# Patient Record
Sex: Male | Born: 1960
Health system: Southern US, Community
[De-identification: ages and names within clinical notes are randomized; demographics above are authoritative.]

## PROBLEM LIST (undated history)

## (undated) DIAGNOSIS — R011 Cardiac murmur, unspecified: Secondary | ICD-10-CM

## (undated) DIAGNOSIS — J432 Centrilobular emphysema: Secondary | ICD-10-CM

## (undated) DIAGNOSIS — C801 Malignant (primary) neoplasm, unspecified: Secondary | ICD-10-CM

## (undated) DIAGNOSIS — D649 Anemia, unspecified: Secondary | ICD-10-CM

## (undated) DIAGNOSIS — F419 Anxiety disorder, unspecified: Secondary | ICD-10-CM

## (undated) DIAGNOSIS — E119 Type 2 diabetes mellitus without complications: Secondary | ICD-10-CM

## (undated) DIAGNOSIS — I10 Essential (primary) hypertension: Secondary | ICD-10-CM

## (undated) DIAGNOSIS — E079 Disorder of thyroid, unspecified: Secondary | ICD-10-CM

## (undated) HISTORY — DX: Essential (primary) hypertension: I10

## (undated) HISTORY — DX: Cardiac murmur, unspecified: R01.1

## (undated) HISTORY — PX: BONE MARROW BIOPSY: SHX199

## (undated) HISTORY — DX: Anemia, unspecified: D64.9

## (undated) HISTORY — PX: BRONCHOSCOPY: SUR163

## (undated) HISTORY — DX: Disorder of thyroid, unspecified: E07.9

## (undated) HISTORY — DX: Anxiety disorder, unspecified: F41.9

## (undated) HISTORY — DX: Malignant (primary) neoplasm, unspecified: C80.1

## (undated) HISTORY — DX: Centrilobular emphysema: J43.2

## (undated) HISTORY — DX: Type 2 diabetes mellitus without complications: E11.9

---

## 2003-05-24 ENCOUNTER — Other Ambulatory Visit: Admission: RE | Admit: 2003-05-24 | Discharge: 2003-05-24 | Payer: Self-pay | Admitting: Oncology

## 2003-05-24 ENCOUNTER — Encounter (INDEPENDENT_AMBULATORY_CARE_PROVIDER_SITE_OTHER): Payer: Self-pay | Admitting: Specialist

## 2004-09-05 ENCOUNTER — Ambulatory Visit: Payer: Self-pay | Admitting: Oncology

## 2004-10-24 ENCOUNTER — Ambulatory Visit: Payer: Self-pay | Admitting: Oncology

## 2004-12-12 ENCOUNTER — Ambulatory Visit: Payer: Self-pay | Admitting: Oncology

## 2005-01-29 ENCOUNTER — Ambulatory Visit: Payer: Self-pay | Admitting: Oncology

## 2005-03-17 ENCOUNTER — Ambulatory Visit: Payer: Self-pay | Admitting: Oncology

## 2005-05-22 ENCOUNTER — Ambulatory Visit: Payer: Self-pay | Admitting: Oncology

## 2005-07-24 ENCOUNTER — Ambulatory Visit: Payer: Self-pay | Admitting: Oncology

## 2005-10-28 ENCOUNTER — Ambulatory Visit: Payer: Self-pay | Admitting: Oncology

## 2006-01-01 ENCOUNTER — Ambulatory Visit: Payer: Self-pay | Admitting: Oncology

## 2006-02-19 ENCOUNTER — Ambulatory Visit: Payer: Self-pay | Admitting: Oncology

## 2006-02-20 LAB — CBC WITH DIFFERENTIAL/PLATELET
BASO%: 0.3 % (ref 0.0–2.0)
LYMPH%: 18.5 % (ref 14.0–48.0)
MCHC: 33.9 g/dL (ref 32.0–35.9)
MONO#: 0.5 10*3/uL (ref 0.1–0.9)
RBC: 5.28 10*6/uL (ref 4.20–5.71)
WBC: 8.8 10*3/uL (ref 4.0–10.0)
lymph#: 1.6 10*3/uL (ref 0.9–3.3)

## 2006-04-06 ENCOUNTER — Ambulatory Visit: Payer: Self-pay | Admitting: Oncology

## 2006-04-08 LAB — COMPREHENSIVE METABOLIC PANEL
Albumin: 4.3 g/dL (ref 3.5–5.2)
BUN: 13 mg/dL (ref 6–23)
Calcium: 9.2 mg/dL (ref 8.4–10.5)
Chloride: 100 mEq/L (ref 96–112)
Glucose, Bld: 158 mg/dL — ABNORMAL HIGH (ref 70–99)
Potassium: 3.8 mEq/L (ref 3.5–5.3)

## 2006-04-08 LAB — CBC & DIFF AND RETIC
BASO%: 0.4 % (ref 0.0–2.0)
EOS%: 2.4 % (ref 0.0–7.0)
Eosinophils Absolute: 0.2 10*3/uL (ref 0.0–0.5)
LYMPH%: 20.9 % (ref 14.0–48.0)
MCHC: 33.8 g/dL (ref 32.0–35.9)
MCV: 82.5 fL (ref 81.6–98.0)
MONO%: 5.2 % (ref 0.0–13.0)
NEUT#: 5.4 10*3/uL (ref 1.5–6.5)
Platelets: 467 10*3/uL — ABNORMAL HIGH (ref 145–400)
RBC: 4.91 10*6/uL (ref 4.20–5.71)
RDW: 17.3 % — ABNORMAL HIGH (ref 11.2–14.6)
RETIC #: 61.4 10*3/uL (ref 31.8–103.9)
Retic %: 1.3 % (ref 0.7–2.3)
WBC: 7.5 10*3/uL (ref 4.0–10.0)

## 2006-04-08 LAB — URIC ACID: Uric Acid, Serum: 8.1 mg/dL — ABNORMAL HIGH (ref 2.4–7.0)

## 2006-04-08 LAB — CHCC SMEAR

## 2006-06-03 ENCOUNTER — Ambulatory Visit: Payer: Self-pay | Admitting: Oncology

## 2006-08-05 ENCOUNTER — Ambulatory Visit: Payer: Self-pay | Admitting: Oncology

## 2006-09-28 ENCOUNTER — Ambulatory Visit: Payer: Self-pay | Admitting: Oncology

## 2006-10-02 LAB — CBC WITH DIFFERENTIAL/PLATELET
Basophils Absolute: 0 10*3/uL (ref 0.0–0.1)
Eosinophils Absolute: 0.1 10*3/uL (ref 0.0–0.5)
HCT: 37.7 % — ABNORMAL LOW (ref 38.7–49.9)
HGB: 12.9 g/dL — ABNORMAL LOW (ref 13.0–17.1)
MONO#: 0.4 10*3/uL (ref 0.1–0.9)
NEUT#: 5.5 10*3/uL (ref 1.5–6.5)
NEUT%: 70.9 % (ref 40.0–75.0)
RDW: 17.1 % — ABNORMAL HIGH (ref 11.2–14.6)
lymph#: 1.7 10*3/uL (ref 0.9–3.3)

## 2006-10-02 LAB — COMPREHENSIVE METABOLIC PANEL
ALT: 27 U/L (ref 0–53)
AST: 19 U/L (ref 0–37)
Albumin: 4.2 g/dL (ref 3.5–5.2)
BUN: 16 mg/dL (ref 6–23)
CO2: 25 mEq/L (ref 19–32)
Calcium: 8.9 mg/dL (ref 8.4–10.5)
Chloride: 103 mEq/L (ref 96–112)
Creatinine, Ser: 0.91 mg/dL (ref 0.40–1.50)
Potassium: 3.6 mEq/L (ref 3.5–5.3)

## 2006-10-02 LAB — LACTATE DEHYDROGENASE: LDH: 129 U/L (ref 94–250)

## 2006-10-02 LAB — URIC ACID: Uric Acid, Serum: 7 mg/dL (ref 2.4–7.0)

## 2006-11-24 ENCOUNTER — Ambulatory Visit: Payer: Self-pay | Admitting: Oncology

## 2006-11-27 LAB — CBC WITH DIFFERENTIAL/PLATELET
BASO%: 0.6 % (ref 0.0–2.0)
Basophils Absolute: 0 10*3/uL (ref 0.0–0.1)
Eosinophils Absolute: 0.1 10*3/uL (ref 0.0–0.5)
HCT: 42.5 % (ref 38.7–49.9)
HCT: 41.4 % (ref 38.7–49.9)
HGB: 14.4 g/dL (ref 13.0–17.1)
HGB: 14 g/dL (ref 13.0–17.1)
LYMPH%: 32 % (ref 14.0–48.0)
MCHC: 34 g/dL (ref 32.0–35.9)
MCV: 80 fL — ABNORMAL LOW (ref 81.6–98.0)
MONO#: 0.2 10*3/uL (ref 0.1–0.9)
MONO#: 0.2 10*3/uL (ref 0.1–0.9)
MONO%: 6.3 % (ref 0.0–13.0)
NEUT#: 2 10*3/uL (ref 1.5–6.5)
NEUT%: 55.6 % (ref 40.0–75.0)
NEUT%: 59.4 % (ref 40.0–75.0)
Platelets: 207 10*3/uL (ref 145–400)
RBC: 5.18 10*6/uL (ref 4.20–5.71)
WBC: 3.9 10*3/uL — ABNORMAL LOW (ref 4.0–10.0)
WBC: 3.4 10*3/uL — ABNORMAL LOW (ref 4.0–10.0)
lymph#: 1.4 10*3/uL (ref 0.9–3.3)

## 2006-11-27 LAB — MORPHOLOGY: PLT EST: ADEQUATE

## 2006-11-27 LAB — COMPREHENSIVE METABOLIC PANEL
AST: 19 U/L (ref 0–37)
Albumin: 3.9 g/dL (ref 3.5–5.2)
Alkaline Phosphatase: 108 U/L (ref 39–117)
BUN: 20 mg/dL (ref 6–23)
Glucose, Bld: 106 mg/dL — ABNORMAL HIGH (ref 70–99)
Potassium: 3.7 mEq/L (ref 3.5–5.3)
Total Bilirubin: 0.2 mg/dL — ABNORMAL LOW (ref 0.3–1.2)

## 2006-12-18 LAB — CBC WITH DIFFERENTIAL/PLATELET
Basophils Absolute: 0 10*3/uL (ref 0.0–0.1)
EOS%: 2.2 % (ref 0.0–7.0)
HCT: 36.8 % — ABNORMAL LOW (ref 38.7–49.9)
HGB: 12.6 g/dL — ABNORMAL LOW (ref 13.0–17.1)
MCH: 27 pg — ABNORMAL LOW (ref 28.0–33.4)
MONO#: 0.5 10*3/uL (ref 0.1–0.9)
NEUT#: 5.1 10*3/uL (ref 1.5–6.5)
NEUT%: 67.5 % (ref 40.0–75.0)
RDW: 17 % — ABNORMAL HIGH (ref 11.2–14.6)
WBC: 7.6 10*3/uL (ref 4.0–10.0)
lymph#: 1.8 10*3/uL (ref 0.9–3.3)

## 2007-01-15 ENCOUNTER — Ambulatory Visit: Payer: Self-pay | Admitting: Oncology

## 2007-01-15 LAB — CBC WITH DIFFERENTIAL/PLATELET
Basophils Absolute: 0 10*3/uL (ref 0.0–0.1)
Eosinophils Absolute: 0.2 10*3/uL (ref 0.0–0.5)
HCT: 40.1 % (ref 38.7–49.9)
HGB: 13.6 g/dL (ref 13.0–17.1)
MCV: 79.4 fL — ABNORMAL LOW (ref 81.6–98.0)
MONO%: 5.6 % (ref 0.0–13.0)
NEUT#: 3.6 10*3/uL (ref 1.5–6.5)
NEUT%: 60.8 % (ref 40.0–75.0)
RDW: 18.5 % — ABNORMAL HIGH (ref 11.2–14.6)
lymph#: 1.8 10*3/uL (ref 0.9–3.3)

## 2007-01-15 LAB — COMPREHENSIVE METABOLIC PANEL
ALT: 16 U/L (ref 0–53)
AST: 16 U/L (ref 0–37)
Albumin: 4.4 g/dL (ref 3.5–5.2)
Alkaline Phosphatase: 101 U/L (ref 39–117)
Glucose, Bld: 100 mg/dL — ABNORMAL HIGH (ref 70–99)
Potassium: 4 mEq/L (ref 3.5–5.3)
Sodium: 135 mEq/L (ref 135–145)
Total Bilirubin: 0.2 mg/dL — ABNORMAL LOW (ref 0.3–1.2)
Total Protein: 7.6 g/dL (ref 6.0–8.3)

## 2007-01-15 LAB — CHCC SMEAR

## 2007-02-19 LAB — CBC WITH DIFFERENTIAL/PLATELET
BASO%: 0.8 % (ref 0.0–2.0)
Basophils Absolute: 0.1 10*3/uL (ref 0.0–0.1)
EOS%: 3 % (ref 0.0–7.0)
HCT: 38.8 % (ref 38.7–49.9)
HGB: 13.6 g/dL (ref 13.0–17.1)
LYMPH%: 26.1 % (ref 14.0–48.0)
MCH: 27.7 pg — ABNORMAL LOW (ref 28.0–33.4)
MCHC: 35.1 g/dL (ref 32.0–35.9)
MCV: 79.1 fL — ABNORMAL LOW (ref 81.6–98.0)
NEUT%: 64.1 % (ref 40.0–75.0)
Platelets: 414 10*3/uL — ABNORMAL HIGH (ref 145–400)
lymph#: 1.8 10*3/uL (ref 0.9–3.3)

## 2007-02-19 LAB — CHCC SMEAR

## 2007-02-19 LAB — LACTATE DEHYDROGENASE: LDH: 137 U/L (ref 94–250)

## 2007-02-19 LAB — COMPREHENSIVE METABOLIC PANEL
ALT: 17 U/L (ref 0–53)
AST: 17 U/L (ref 0–37)
Albumin: 4 g/dL (ref 3.5–5.2)
Alkaline Phosphatase: 97 U/L (ref 39–117)
BUN: 14 mg/dL (ref 6–23)
CO2: 23 mEq/L (ref 19–32)
Calcium: 8.9 mg/dL (ref 8.4–10.5)
Chloride: 105 mEq/L (ref 96–112)
Creatinine, Ser: 1.06 mg/dL (ref 0.40–1.50)
Glucose, Bld: 95 mg/dL (ref 70–99)
Potassium: 4.3 mEq/L (ref 3.5–5.3)
Sodium: 140 mEq/L (ref 135–145)
Total Bilirubin: 0.2 mg/dL — ABNORMAL LOW (ref 0.3–1.2)
Total Protein: 6.8 g/dL (ref 6.0–8.3)

## 2007-02-19 LAB — URIC ACID: Uric Acid, Serum: 5.4 mg/dL (ref 2.4–7.0)

## 2007-03-17 ENCOUNTER — Ambulatory Visit: Payer: Self-pay | Admitting: Oncology

## 2007-03-19 LAB — COMPREHENSIVE METABOLIC PANEL
AST: 15 U/L (ref 0–37)
Albumin: 4.3 g/dL (ref 3.5–5.2)
BUN: 20 mg/dL (ref 6–23)
Calcium: 9.4 mg/dL (ref 8.4–10.5)
Chloride: 104 mEq/L (ref 96–112)
Glucose, Bld: 130 mg/dL — ABNORMAL HIGH (ref 70–99)
Potassium: 3.8 mEq/L (ref 3.5–5.3)
Sodium: 139 mEq/L (ref 135–145)
Total Protein: 7.6 g/dL (ref 6.0–8.3)

## 2007-03-19 LAB — CBC WITH DIFFERENTIAL/PLATELET
EOS%: 4.3 % (ref 0.0–7.0)
MCH: 28 pg (ref 28.0–33.4)
MCV: 80.2 fL — ABNORMAL LOW (ref 81.6–98.0)
MONO%: 5.5 % (ref 0.0–13.0)
RBC: 5.06 10*6/uL (ref 4.20–5.71)
RDW: 18.3 % — ABNORMAL HIGH (ref 11.2–14.6)

## 2007-03-19 LAB — URIC ACID: Uric Acid, Serum: 7 mg/dL (ref 2.4–7.0)

## 2007-03-19 LAB — CHCC SMEAR

## 2007-04-16 LAB — COMPREHENSIVE METABOLIC PANEL
Albumin: 4.1 g/dL (ref 3.5–5.2)
Alkaline Phosphatase: 100 U/L (ref 39–117)
BUN: 15 mg/dL (ref 6–23)
Calcium: 9.1 mg/dL (ref 8.4–10.5)
Chloride: 107 mEq/L (ref 96–112)
Creatinine, Ser: 0.91 mg/dL (ref 0.40–1.50)
Glucose, Bld: 148 mg/dL — ABNORMAL HIGH (ref 70–99)
Potassium: 3.7 mEq/L (ref 3.5–5.3)

## 2007-04-16 LAB — CBC WITH DIFFERENTIAL/PLATELET
BASO%: 0.8 % (ref 0.0–2.0)
EOS%: 2.2 % (ref 0.0–7.0)
LYMPH%: 21.9 % (ref 14.0–48.0)
MCH: 28.2 pg (ref 28.0–33.4)
MCHC: 34.7 g/dL (ref 32.0–35.9)
MONO#: 0.4 10*3/uL (ref 0.1–0.9)
NEUT%: 69.7 % (ref 40.0–75.0)
Platelets: 474 10*3/uL — ABNORMAL HIGH (ref 145–400)
RBC: 4.63 10*6/uL (ref 4.20–5.71)
WBC: 7 10*3/uL (ref 4.0–10.0)

## 2007-04-16 LAB — URIC ACID: Uric Acid, Serum: 6 mg/dL (ref 2.4–7.0)

## 2007-04-16 LAB — CHCC SMEAR

## 2007-05-26 ENCOUNTER — Ambulatory Visit: Payer: Self-pay | Admitting: Oncology

## 2007-07-16 ENCOUNTER — Ambulatory Visit: Payer: Self-pay | Admitting: Oncology

## 2007-07-20 LAB — COMPREHENSIVE METABOLIC PANEL
ALT: 24 U/L (ref 0–53)
AST: 18 U/L (ref 0–37)
Albumin: 4.5 g/dL (ref 3.5–5.2)
Calcium: 9.4 mg/dL (ref 8.4–10.5)
Chloride: 99 mEq/L (ref 96–112)
Potassium: 4.2 mEq/L (ref 3.5–5.3)
Sodium: 136 mEq/L (ref 135–145)
Total Protein: 7.8 g/dL (ref 6.0–8.3)

## 2007-07-20 LAB — CBC WITH DIFFERENTIAL/PLATELET
BASO%: 0.4 % (ref 0.0–2.0)
Eosinophils Absolute: 0.3 10*3/uL (ref 0.0–0.5)
MCHC: 34.5 g/dL (ref 32.0–35.9)
MONO#: 0.5 10*3/uL (ref 0.1–0.9)
NEUT#: 5.9 10*3/uL (ref 1.5–6.5)
RBC: 5.15 10*6/uL (ref 4.20–5.71)
WBC: 8.7 10*3/uL (ref 4.0–10.0)
lymph#: 2 10*3/uL (ref 0.9–3.3)

## 2007-07-20 LAB — CHCC SMEAR

## 2007-07-20 LAB — LACTATE DEHYDROGENASE: LDH: 146 U/L (ref 94–250)

## 2007-09-15 ENCOUNTER — Ambulatory Visit: Payer: Self-pay | Admitting: Oncology

## 2007-09-17 LAB — CBC WITH DIFFERENTIAL/PLATELET
Basophils Absolute: 0 10*3/uL (ref 0.0–0.1)
EOS%: 2.6 % (ref 0.0–7.0)
Eosinophils Absolute: 0.2 10*3/uL (ref 0.0–0.5)
HGB: 12.6 g/dL — ABNORMAL LOW (ref 13.0–17.1)
LYMPH%: 22.6 % (ref 14.0–48.0)
MCH: 27.8 pg — ABNORMAL LOW (ref 28.0–33.4)
MCV: 80.3 fL — ABNORMAL LOW (ref 81.6–98.0)
MONO%: 6 % (ref 0.0–13.0)
Platelets: 408 10*3/uL — ABNORMAL HIGH (ref 145–400)
RDW: 16.3 % — ABNORMAL HIGH (ref 11.2–14.6)

## 2007-10-15 LAB — CBC WITH DIFFERENTIAL/PLATELET
BASO%: 1.1 % (ref 0.0–2.0)
EOS%: 3.1 % (ref 0.0–7.0)
Eosinophils Absolute: 0.2 10*3/uL (ref 0.0–0.5)
LYMPH%: 22.8 % (ref 14.0–48.0)
MCH: 27.1 pg — ABNORMAL LOW (ref 28.0–33.4)
MCHC: 34.1 g/dL (ref 32.0–35.9)
MCV: 79.6 fL — ABNORMAL LOW (ref 81.6–98.0)
MONO%: 5.6 % (ref 0.0–13.0)
NEUT#: 4.9 10*3/uL (ref 1.5–6.5)
Platelets: 533 10*3/uL — ABNORMAL HIGH (ref 145–400)
RBC: 4.7 10*6/uL (ref 4.20–5.71)
RDW: 17.3 % — ABNORMAL HIGH (ref 11.2–14.6)

## 2007-11-10 ENCOUNTER — Ambulatory Visit: Payer: Self-pay | Admitting: Oncology

## 2007-11-12 LAB — CBC WITH DIFFERENTIAL/PLATELET
BASO%: 0.1 % (ref 0.0–2.0)
Eosinophils Absolute: 0.1 10*3/uL (ref 0.0–0.5)
LYMPH%: 22.1 % (ref 14.0–48.0)
MCHC: 33.5 g/dL (ref 32.0–35.9)
MONO#: 0.4 10*3/uL (ref 0.1–0.9)
NEUT#: 5.3 10*3/uL (ref 1.5–6.5)
Platelets: 475 10*3/uL — ABNORMAL HIGH (ref 145–400)
RBC: 4.86 10*6/uL (ref 4.20–5.71)
RDW: 17.5 % — ABNORMAL HIGH (ref 11.2–14.6)
WBC: 7.5 10*3/uL (ref 4.0–10.0)
lymph#: 1.7 10*3/uL (ref 0.9–3.3)

## 2007-12-10 LAB — CBC WITH DIFFERENTIAL/PLATELET
BASO%: 0.4 % (ref 0.0–2.0)
EOS%: 1.7 % (ref 0.0–7.0)
HCT: 36.7 % — ABNORMAL LOW (ref 38.7–49.9)
HGB: 12.5 g/dL — ABNORMAL LOW (ref 13.0–17.1)
MCH: 27.1 pg — ABNORMAL LOW (ref 28.0–33.4)
MCHC: 34.1 g/dL (ref 32.0–35.9)
MONO#: 0.4 10*3/uL (ref 0.1–0.9)
RDW: 18.2 % — ABNORMAL HIGH (ref 11.2–14.6)
WBC: 6.9 10*3/uL (ref 4.0–10.0)
lymph#: 1.6 10*3/uL (ref 0.9–3.3)

## 2008-01-05 ENCOUNTER — Ambulatory Visit: Payer: Self-pay | Admitting: Oncology

## 2008-02-23 ENCOUNTER — Ambulatory Visit: Payer: Self-pay | Admitting: Oncology

## 2008-02-28 LAB — COMPREHENSIVE METABOLIC PANEL
ALT: 26 U/L (ref 0–53)
CO2: 20 mEq/L (ref 19–32)
Calcium: 9.4 mg/dL (ref 8.4–10.5)
Chloride: 104 mEq/L (ref 96–112)
Creatinine, Ser: 0.87 mg/dL (ref 0.40–1.50)
Glucose, Bld: 129 mg/dL — ABNORMAL HIGH (ref 70–99)
Total Bilirubin: 0.3 mg/dL (ref 0.3–1.2)
Total Protein: 7.2 g/dL (ref 6.0–8.3)

## 2008-02-28 LAB — CHCC SMEAR

## 2008-02-28 LAB — LACTATE DEHYDROGENASE: LDH: 148 U/L (ref 94–250)

## 2008-02-28 LAB — CBC WITH DIFFERENTIAL/PLATELET
BASO%: 0.8 % (ref 0.0–2.0)
EOS%: 2.2 % (ref 0.0–7.0)
Eosinophils Absolute: 0.2 10*3/uL (ref 0.0–0.5)
LYMPH%: 20.4 % (ref 14.0–48.0)
MCH: 26.8 pg — ABNORMAL LOW (ref 28.0–33.4)
MCHC: 34 g/dL (ref 32.0–35.9)
MCV: 78.7 fL — ABNORMAL LOW (ref 81.6–98.0)
MONO%: 5.5 % (ref 0.0–13.0)
Platelets: 552 10*3/uL — ABNORMAL HIGH (ref 145–400)
RBC: 5.23 10*6/uL (ref 4.20–5.71)

## 2008-02-28 LAB — MORPHOLOGY

## 2008-04-26 ENCOUNTER — Ambulatory Visit: Payer: Self-pay | Admitting: Oncology

## 2008-04-28 LAB — CBC WITH DIFFERENTIAL/PLATELET
Basophils Absolute: 0 10*3/uL (ref 0.0–0.1)
Eosinophils Absolute: 0.1 10*3/uL (ref 0.0–0.5)
HGB: 12.5 g/dL — ABNORMAL LOW (ref 13.0–17.1)
LYMPH%: 19.9 % (ref 14.0–48.0)
MCH: 26.8 pg — ABNORMAL LOW (ref 28.0–33.4)
MCV: 79.6 fL — ABNORMAL LOW (ref 81.6–98.0)
MONO%: 5.6 % (ref 0.0–13.0)
NEUT#: 6.2 10*3/uL (ref 1.5–6.5)
NEUT%: 72.8 % (ref 40.0–75.0)
Platelets: 535 10*3/uL — ABNORMAL HIGH (ref 145–400)

## 2008-06-18 ENCOUNTER — Ambulatory Visit: Payer: Self-pay | Admitting: Oncology

## 2008-06-23 LAB — CBC WITH DIFFERENTIAL/PLATELET
Basophils Absolute: 0 10*3/uL (ref 0.0–0.1)
Eosinophils Absolute: 0.2 10*3/uL (ref 0.0–0.5)
HCT: 37 % — ABNORMAL LOW (ref 38.7–49.9)
HGB: 12.6 g/dL — ABNORMAL LOW (ref 13.0–17.1)
MCV: 80.3 fL — ABNORMAL LOW (ref 81.6–98.0)
NEUT#: 6 10*3/uL (ref 1.5–6.5)
NEUT%: 67.9 % (ref 40.0–75.0)
RDW: 17.5 % — ABNORMAL HIGH (ref 11.2–14.6)
lymph#: 2 10*3/uL (ref 0.9–3.3)

## 2008-07-28 LAB — CBC WITH DIFFERENTIAL/PLATELET
BASO%: 0.5 % (ref 0.0–2.0)
Basophils Absolute: 0 10*3/uL (ref 0.0–0.1)
EOS%: 4.5 % (ref 0.0–7.0)
HCT: 38.6 % — ABNORMAL LOW (ref 38.7–49.9)
HGB: 13.2 g/dL (ref 13.0–17.1)
MCH: 27.4 pg — ABNORMAL LOW (ref 28.0–33.4)
MONO#: 0.3 10*3/uL (ref 0.1–0.9)
NEUT#: 3.7 10*3/uL (ref 1.5–6.5)
NEUT%: 62.1 % (ref 40.0–75.0)
RDW: 18 % — ABNORMAL HIGH (ref 11.2–14.6)
WBC: 5.9 10*3/uL (ref 4.0–10.0)
lymph#: 1.6 10*3/uL (ref 0.9–3.3)

## 2008-08-13 ENCOUNTER — Ambulatory Visit (HOSPITAL_COMMUNITY): Admission: RE | Admit: 2008-08-13 | Discharge: 2008-08-13 | Payer: Self-pay | Admitting: Family Medicine

## 2008-08-16 ENCOUNTER — Ambulatory Visit: Payer: Self-pay | Admitting: Oncology

## 2008-08-18 LAB — CBC WITH DIFFERENTIAL/PLATELET
Basophils Absolute: 0 10*3/uL (ref 0.0–0.1)
EOS%: 2.4 % (ref 0.0–7.0)
HCT: 27.3 % — ABNORMAL LOW (ref 38.7–49.9)
HGB: 9.4 g/dL — ABNORMAL LOW (ref 13.0–17.1)
MCH: 27.5 pg — ABNORMAL LOW (ref 28.0–33.4)
MCV: 80.3 fL — ABNORMAL LOW (ref 81.6–98.0)
NEUT%: 67.9 % (ref 40.0–75.0)
lymph#: 1.9 10*3/uL (ref 0.9–3.3)

## 2008-08-24 LAB — MORPHOLOGY
PLT EST: INCREASED
RBC Comments: 2

## 2008-08-24 LAB — CBC & DIFF AND RETIC
BASO%: 0.1 % (ref 0.0–2.0)
EOS%: 1.9 % (ref 0.0–7.0)
MCH: 26.5 pg — ABNORMAL LOW (ref 28.0–33.4)
MCHC: 33.1 g/dL (ref 32.0–35.9)
MCV: 80.1 fL — ABNORMAL LOW (ref 81.6–98.0)
MONO%: 8.7 % (ref 0.0–13.0)
NEUT#: 5.2 10*3/uL (ref 1.5–6.5)
RBC: 2.98 10*6/uL — ABNORMAL LOW (ref 4.20–5.71)
RDW: 17.5 % — ABNORMAL HIGH (ref 11.2–14.6)
RETIC #: 103.1 10*3/uL (ref 31.8–103.9)
Retic %: 3.5 % — ABNORMAL HIGH (ref 0.7–2.3)

## 2008-08-24 LAB — IRON AND TIBC: %SAT: 4 % — ABNORMAL LOW (ref 20–55)

## 2008-08-24 LAB — FERRITIN: Ferritin: 60 ng/mL (ref 22–322)

## 2008-08-31 ENCOUNTER — Ambulatory Visit (HOSPITAL_COMMUNITY): Admission: RE | Admit: 2008-08-31 | Discharge: 2008-08-31 | Payer: Self-pay | Admitting: Oncology

## 2008-08-31 ENCOUNTER — Inpatient Hospital Stay (HOSPITAL_COMMUNITY): Admission: AD | Admit: 2008-08-31 | Discharge: 2008-09-02 | Payer: Self-pay | Admitting: Oncology

## 2008-08-31 ENCOUNTER — Encounter: Payer: Self-pay | Admitting: Pulmonary Disease

## 2008-08-31 ENCOUNTER — Ambulatory Visit: Payer: Self-pay | Admitting: Internal Medicine

## 2008-08-31 LAB — CBC & DIFF AND RETIC
BASO%: 0.2 % (ref 0.0–2.0)
EOS%: 2 % (ref 0.0–7.0)
HCT: 23.9 % — ABNORMAL LOW (ref 38.7–49.9)
LYMPH%: 22.3 % (ref 14.0–48.0)
MCH: 26 pg — ABNORMAL LOW (ref 28.0–33.4)
MCHC: 32.4 g/dL (ref 32.0–35.9)
MCV: 80.4 fL — ABNORMAL LOW (ref 81.6–98.0)
MONO%: 4.3 % (ref 0.0–13.0)
NEUT%: 71.2 % (ref 40.0–75.0)
Platelets: 1129 10*3/uL — ABNORMAL HIGH (ref 145–400)

## 2008-08-31 LAB — RETICULOCYTES (CHCC)
ABS Retic: 155.6 10*3/uL (ref 19.0–186.0)
RBC.: 3.05 MIL/uL — ABNORMAL LOW (ref 4.22–5.81)

## 2008-08-31 LAB — CHCC SMEAR

## 2008-08-31 LAB — MORPHOLOGY

## 2008-09-01 ENCOUNTER — Encounter: Payer: Self-pay | Admitting: Pulmonary Disease

## 2008-09-01 ENCOUNTER — Ambulatory Visit: Payer: Self-pay | Admitting: Oncology

## 2008-09-06 DIAGNOSIS — D638 Anemia in other chronic diseases classified elsewhere: Secondary | ICD-10-CM | POA: Insufficient documentation

## 2008-09-06 DIAGNOSIS — D47Z9 Other specified neoplasms of uncertain behavior of lymphoid, hematopoietic and related tissue: Secondary | ICD-10-CM | POA: Insufficient documentation

## 2008-09-06 DIAGNOSIS — D473 Essential (hemorrhagic) thrombocythemia: Secondary | ICD-10-CM | POA: Insufficient documentation

## 2008-09-06 DIAGNOSIS — E039 Hypothyroidism, unspecified: Secondary | ICD-10-CM

## 2008-09-06 DIAGNOSIS — I10 Essential (primary) hypertension: Secondary | ICD-10-CM

## 2008-09-07 ENCOUNTER — Ambulatory Visit: Payer: Self-pay | Admitting: Pulmonary Disease

## 2008-09-07 DIAGNOSIS — R042 Hemoptysis: Secondary | ICD-10-CM | POA: Insufficient documentation

## 2008-09-07 LAB — CBC & DIFF AND RETIC
Basophils Absolute: 0 10*3/uL (ref 0.0–0.1)
EOS%: 1.8 % (ref 0.0–7.0)
Eosinophils Absolute: 0.1 10*3/uL (ref 0.0–0.5)
HCT: 26 % — ABNORMAL LOW (ref 38.7–49.9)
HGB: 8.5 g/dL — ABNORMAL LOW (ref 13.0–17.1)
LYMPH%: 23.3 % (ref 14.0–48.0)
MCH: 26.2 pg — ABNORMAL LOW (ref 28.0–33.4)
MCV: 80.6 fL — ABNORMAL LOW (ref 81.6–98.0)
MONO%: 6.6 % (ref 0.0–13.0)
NEUT#: 4.9 10*3/uL (ref 1.5–6.5)
NEUT%: 68.3 % (ref 40.0–75.0)
Platelets: 689 10*3/uL — ABNORMAL HIGH (ref 145–400)
RETIC #: 166 10*3/uL — ABNORMAL HIGH (ref 31.8–103.9)

## 2008-09-07 LAB — COMPREHENSIVE METABOLIC PANEL
AST: 16 U/L (ref 0–37)
Albumin: 4 g/dL (ref 3.5–5.2)
BUN: 19 mg/dL (ref 6–23)
CO2: 21 mEq/L (ref 19–32)
Calcium: 9.1 mg/dL (ref 8.4–10.5)
Chloride: 104 mEq/L (ref 96–112)
Creatinine, Ser: 0.98 mg/dL (ref 0.40–1.50)
Potassium: 4.6 mEq/L (ref 3.5–5.3)

## 2008-09-07 LAB — MORPHOLOGY

## 2008-09-07 LAB — LACTATE DEHYDROGENASE: LDH: 153 U/L (ref 94–250)

## 2008-09-12 LAB — CBC WITH DIFFERENTIAL/PLATELET
Basophils Absolute: 0 10*3/uL (ref 0.0–0.1)
Eosinophils Absolute: 0.1 10*3/uL (ref 0.0–0.5)
HGB: 8.7 g/dL — ABNORMAL LOW (ref 13.0–17.1)
MONO#: 0.5 10*3/uL (ref 0.1–0.9)
NEUT#: 6 10*3/uL (ref 1.5–6.5)
RDW: 18.7 % — ABNORMAL HIGH (ref 11.2–14.6)
lymph#: 1.4 10*3/uL (ref 0.9–3.3)

## 2008-09-12 LAB — MORPHOLOGY: PLT EST: INCREASED

## 2008-09-21 LAB — CBC WITH DIFFERENTIAL/PLATELET
BASO%: 0.1 % (ref 0.0–2.0)
EOS%: 2.3 % (ref 0.0–7.0)
HCT: 29.5 % — ABNORMAL LOW (ref 38.7–49.9)
MCH: 25.1 pg — ABNORMAL LOW (ref 28.0–33.4)
MCHC: 32.2 g/dL (ref 32.0–35.9)
NEUT%: 67.3 % (ref 40.0–75.0)
lymph#: 1.8 10*3/uL (ref 0.9–3.3)

## 2008-09-21 LAB — MORPHOLOGY

## 2008-09-27 LAB — CBC WITH DIFFERENTIAL/PLATELET
BASO%: 0.6 % (ref 0.0–2.0)
EOS%: 3.4 % (ref 0.0–7.0)
MCH: 25.1 pg — ABNORMAL LOW (ref 28.0–33.4)
MCHC: 32.5 g/dL (ref 32.0–35.9)
MCV: 77.5 fL — ABNORMAL LOW (ref 81.6–98.0)
MONO%: 7.1 % (ref 0.0–13.0)
RBC: 3.81 10*6/uL — ABNORMAL LOW (ref 4.20–5.71)
RDW: 19.2 % — ABNORMAL HIGH (ref 11.2–14.6)

## 2008-09-27 LAB — MORPHOLOGY

## 2008-10-03 ENCOUNTER — Ambulatory Visit: Payer: Self-pay | Admitting: Oncology

## 2008-10-05 LAB — CBC WITH DIFFERENTIAL/PLATELET
Eosinophils Absolute: 0.1 10*3/uL (ref 0.0–0.5)
HCT: 32.3 % — ABNORMAL LOW (ref 38.7–49.9)
LYMPH%: 22.2 % (ref 14.0–48.0)
MCHC: 32.7 g/dL (ref 32.0–35.9)
MCV: 76.8 fL — ABNORMAL LOW (ref 81.6–98.0)
MONO%: 8.6 % (ref 0.0–13.0)
NEUT#: 4.1 10*3/uL (ref 1.5–6.5)
NEUT%: 67.1 % (ref 40.0–75.0)
Platelets: 510 10*3/uL — ABNORMAL HIGH (ref 145–400)
RBC: 4.21 10*6/uL (ref 4.20–5.71)

## 2008-10-05 LAB — MORPHOLOGY: PLT EST: INCREASED

## 2008-10-12 LAB — CBC WITH DIFFERENTIAL/PLATELET
Basophils Absolute: 0 10*3/uL (ref 0.0–0.1)
Eosinophils Absolute: 0.1 10*3/uL (ref 0.0–0.5)
HGB: 11.2 g/dL — ABNORMAL LOW (ref 13.0–17.1)
MCV: 76.6 fL — ABNORMAL LOW (ref 81.6–98.0)
MONO#: 0.5 10*3/uL (ref 0.1–0.9)
NEUT#: 4 10*3/uL (ref 1.5–6.5)
RBC: 4.45 10*6/uL (ref 4.20–5.71)
RDW: 19.4 % — ABNORMAL HIGH (ref 11.2–14.6)
WBC: 5.8 10*3/uL (ref 4.0–10.0)
lymph#: 1.1 10*3/uL (ref 0.9–3.3)

## 2008-10-12 LAB — MORPHOLOGY: PLT EST: INCREASED

## 2008-10-19 LAB — CBC WITH DIFFERENTIAL/PLATELET
Basophils Absolute: 0 10*3/uL (ref 0.0–0.1)
Eosinophils Absolute: 0.1 10*3/uL (ref 0.0–0.5)
HGB: 10.7 g/dL — ABNORMAL LOW (ref 13.0–17.1)
MCV: 76.3 fL — ABNORMAL LOW (ref 81.6–98.0)
MONO#: 0.4 10*3/uL (ref 0.1–0.9)
MONO%: 7.8 % (ref 0.0–13.0)
NEUT#: 3.9 10*3/uL (ref 1.5–6.5)
Platelets: 311 10*3/uL (ref 145–400)
RDW: 19.5 % — ABNORMAL HIGH (ref 11.2–14.6)
WBC: 5.7 10*3/uL (ref 4.0–10.0)

## 2008-10-19 LAB — MORPHOLOGY: PLT EST: ADEQUATE

## 2008-11-10 LAB — CBC WITH DIFFERENTIAL/PLATELET
BASO%: 0.3 % (ref 0.0–2.0)
LYMPH%: 28.1 % (ref 14.0–48.0)
MCH: 24.9 pg — ABNORMAL LOW (ref 28.0–33.4)
MCHC: 33.3 g/dL (ref 32.0–35.9)
MCV: 74.8 fL — ABNORMAL LOW (ref 81.6–98.0)
MONO%: 7.4 % (ref 0.0–13.0)
NEUT%: 62.6 % (ref 40.0–75.0)
Platelets: 239 10*3/uL (ref 145–400)
RBC: 4.33 10*6/uL (ref 4.20–5.71)

## 2008-11-10 LAB — IRON AND TIBC
Iron: 80 ug/dL (ref 42–165)
UIBC: 264 ug/dL

## 2008-11-10 LAB — COMPREHENSIVE METABOLIC PANEL
Albumin: 4.3 g/dL (ref 3.5–5.2)
BUN: 23 mg/dL (ref 6–23)
CO2: 22 mEq/L (ref 19–32)
Calcium: 9.1 mg/dL (ref 8.4–10.5)
Chloride: 105 mEq/L (ref 96–112)
Creatinine, Ser: 1.03 mg/dL (ref 0.40–1.50)
Glucose, Bld: 80 mg/dL (ref 70–99)

## 2008-11-10 LAB — LACTATE DEHYDROGENASE: LDH: 186 U/L (ref 94–250)

## 2008-11-10 LAB — MORPHOLOGY

## 2008-11-15 ENCOUNTER — Ambulatory Visit: Payer: Self-pay | Admitting: Oncology

## 2008-11-15 IMAGING — CR DG CHEST 2V
2 series · 2 of 2 positions shown · non-contrast
Comparison: 09/01/2008

CLINICAL DATA: Hemoptysis.  History of smoking, hypertension

CHEST - 2 VIEW

[view not recorded (1 of 2)]
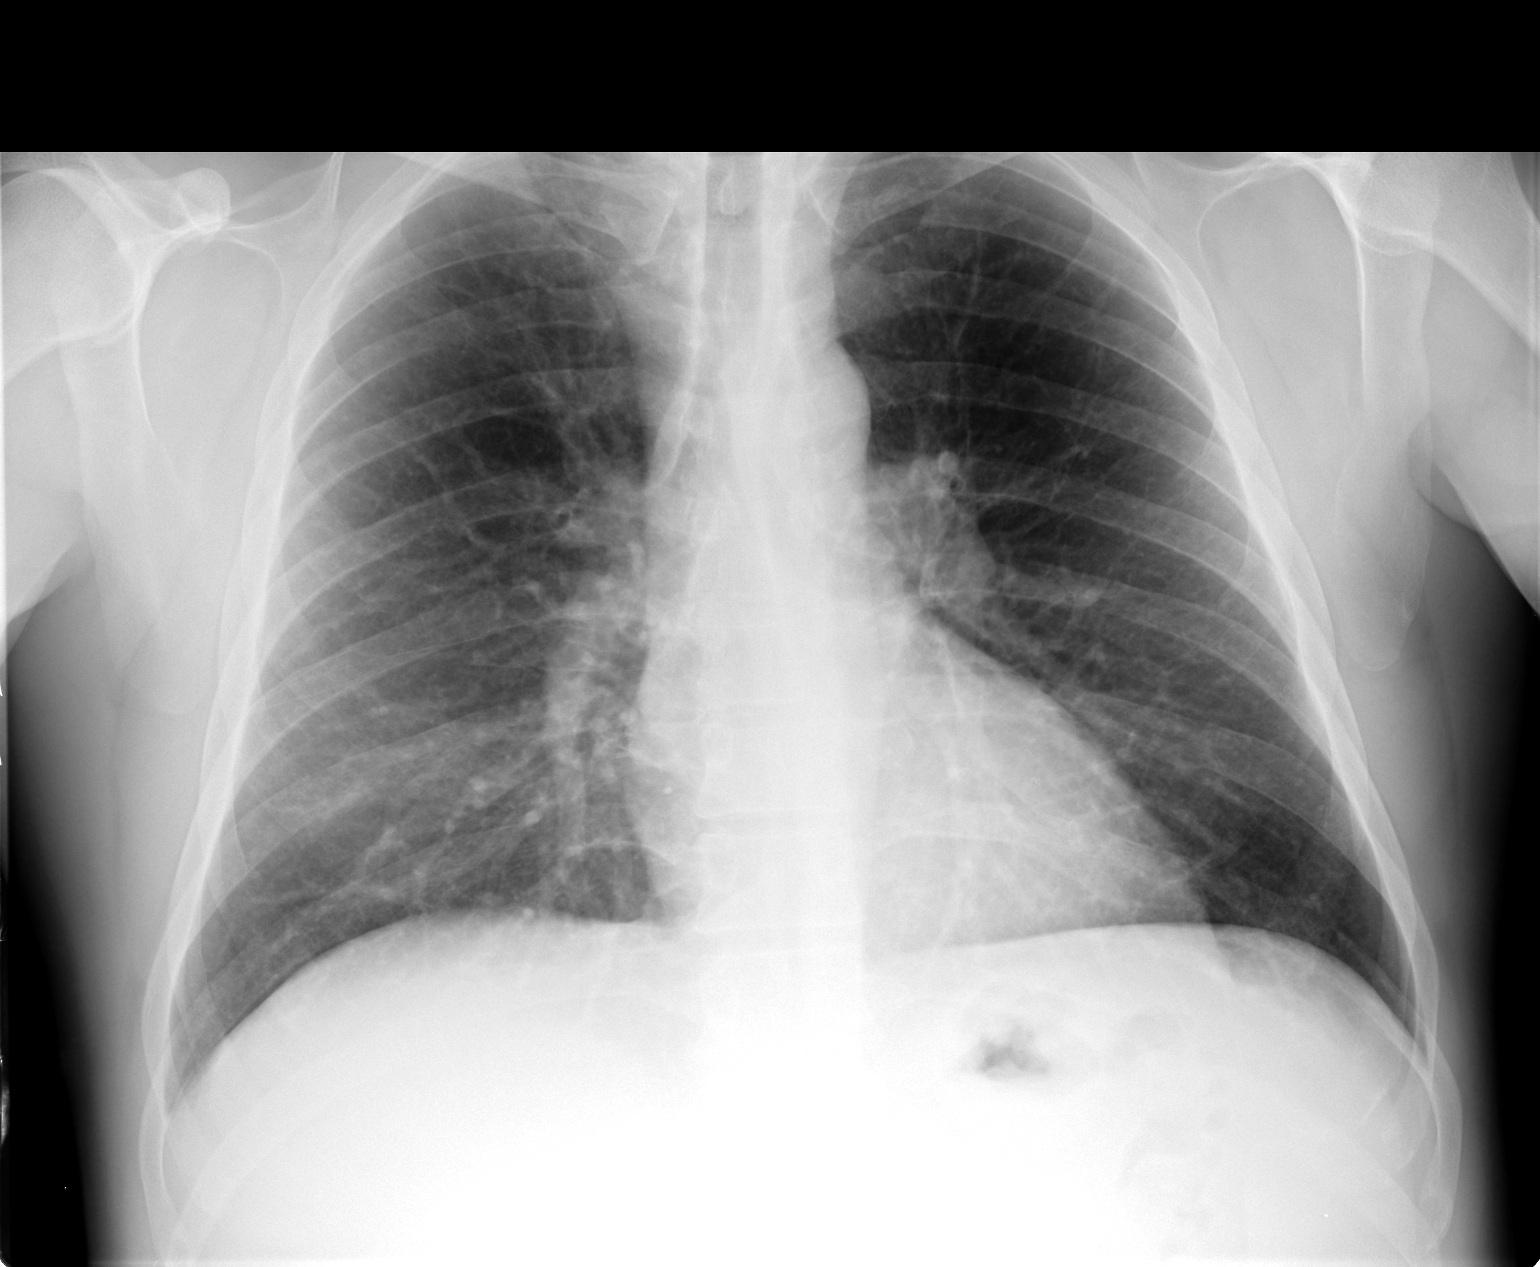

[view not recorded (2 of 2)]
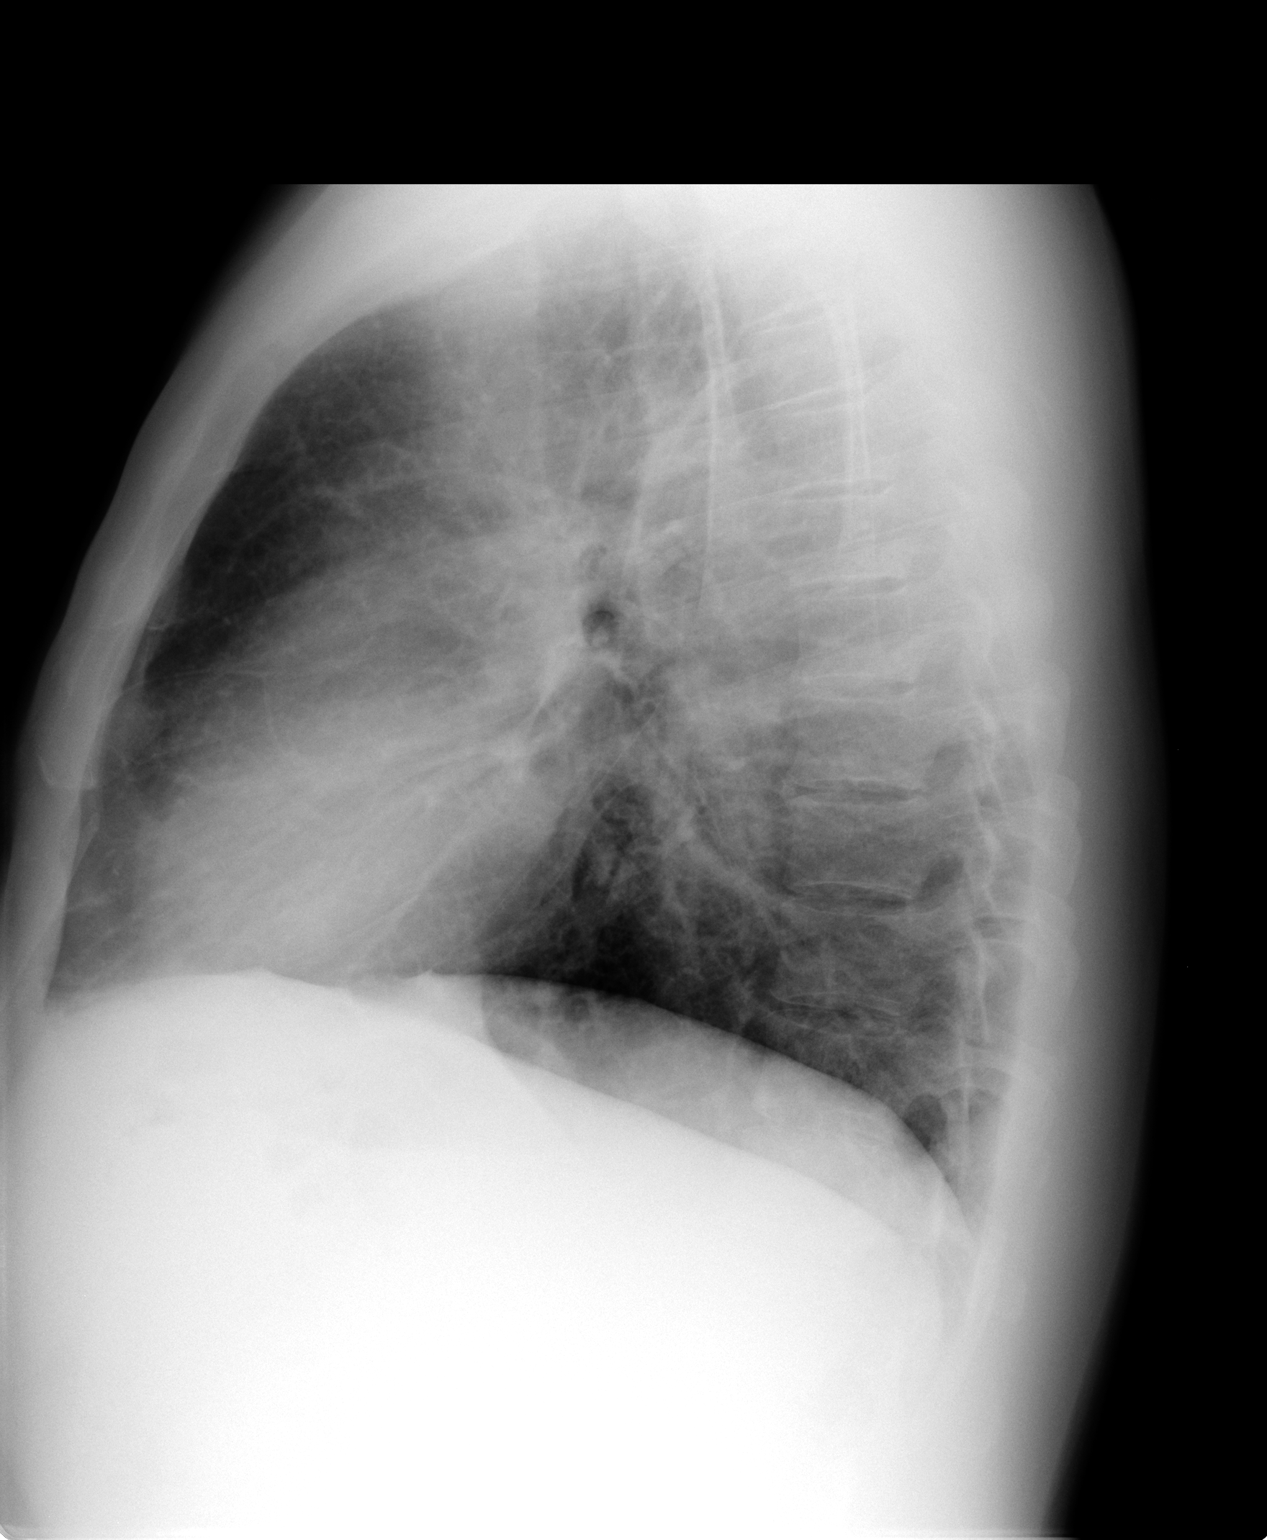

[2 of 2 positions shown; findings below may reference images not displayed]

FINDINGS: Heart is normal in size.  There are no focal
consolidations or pleural effusions.  Aeration in the right upper
lobe has improved since the prior study.
IMPRESSION: Improved aeration.

## 2008-12-01 ENCOUNTER — Ambulatory Visit: Payer: Self-pay | Admitting: Oncology

## 2008-12-01 LAB — MORPHOLOGY

## 2008-12-01 LAB — CBC WITH DIFFERENTIAL/PLATELET
Eosinophils Absolute: 0.1 10*3/uL (ref 0.0–0.5)
HCT: 31 % — ABNORMAL LOW (ref 38.7–49.9)
LYMPH%: 25.4 % (ref 14.0–48.0)
MCHC: 33.7 g/dL (ref 32.0–35.9)
MCV: 75.2 fL — ABNORMAL LOW (ref 81.6–98.0)
MONO%: 7.3 % (ref 0.0–13.0)
NEUT#: 2.9 10*3/uL (ref 1.5–6.5)
NEUT%: 65.4 % (ref 40.0–75.0)
Platelets: 235 10*3/uL (ref 145–400)
RBC: 4.12 10*6/uL — ABNORMAL LOW (ref 4.20–5.71)

## 2009-01-15 LAB — CBC WITH DIFFERENTIAL/PLATELET
Eosinophils Absolute: 0 10*3/uL (ref 0.0–0.5)
LYMPH%: 23.2 % (ref 14.0–49.0)
MONO#: 0.3 10*3/uL (ref 0.1–0.9)
NEUT#: 3.7 10*3/uL (ref 1.5–6.5)
Platelets: 224 10*3/uL (ref 140–400)
RBC: 4 10*6/uL — ABNORMAL LOW (ref 4.20–5.82)
WBC: 5.4 10*3/uL (ref 4.0–10.3)
lymph#: 1.2 10*3/uL (ref 0.9–3.3)

## 2009-01-15 LAB — COMPREHENSIVE METABOLIC PANEL
AST: 22 U/L (ref 0–37)
BUN: 24 mg/dL — ABNORMAL HIGH (ref 6–23)
Calcium: 8.9 mg/dL (ref 8.4–10.5)
Chloride: 105 mEq/L (ref 96–112)
Creatinine, Ser: 1.17 mg/dL (ref 0.40–1.50)
Glucose, Bld: 110 mg/dL — ABNORMAL HIGH (ref 70–99)

## 2009-01-15 LAB — MORPHOLOGY

## 2009-01-15 LAB — IRON AND TIBC
Iron: 87 ug/dL (ref 42–165)
TIBC: 335 ug/dL (ref 215–435)
UIBC: 248 ug/dL

## 2009-01-15 LAB — LACTATE DEHYDROGENASE: LDH: 175 U/L (ref 94–250)

## 2009-01-17 ENCOUNTER — Ambulatory Visit: Payer: Self-pay | Admitting: Oncology

## 2009-02-05 LAB — CBC WITH DIFFERENTIAL/PLATELET
BASO%: 0.3 % (ref 0.0–2.0)
EOS%: 2.3 % (ref 0.0–7.0)
HCT: 30.9 % — ABNORMAL LOW (ref 38.4–49.9)
LYMPH%: 28.7 % (ref 14.0–49.0)
MCH: 26.8 pg — ABNORMAL LOW (ref 27.2–33.4)
MCHC: 33.5 g/dL (ref 32.0–36.0)
MONO#: 0.3 10*3/uL (ref 0.1–0.9)
NEUT%: 60.5 % (ref 39.0–75.0)
Platelets: 232 10*3/uL (ref 140–400)

## 2009-02-05 LAB — MORPHOLOGY

## 2009-03-15 ENCOUNTER — Ambulatory Visit: Payer: Self-pay | Admitting: Oncology

## 2009-04-08 ENCOUNTER — Emergency Department (HOSPITAL_COMMUNITY): Admission: EM | Admit: 2009-04-08 | Discharge: 2009-04-08 | Payer: Self-pay | Admitting: Emergency Medicine

## 2009-04-13 LAB — CBC WITH DIFFERENTIAL/PLATELET
BASO%: 0.4 % (ref 0.0–2.0)
EOS%: 1.6 % (ref 0.0–7.0)
MCH: 28.2 pg (ref 27.2–33.4)
MCHC: 34.8 g/dL (ref 32.0–36.0)
RDW: 19 % — ABNORMAL HIGH (ref 11.0–14.6)
lymph#: 1.1 10*3/uL (ref 0.9–3.3)

## 2009-04-13 LAB — MORPHOLOGY

## 2009-05-02 ENCOUNTER — Ambulatory Visit: Payer: Self-pay | Admitting: Oncology

## 2009-06-06 ENCOUNTER — Ambulatory Visit: Payer: Self-pay | Admitting: Oncology

## 2009-06-08 LAB — CBC & DIFF AND RETIC
Basophils Absolute: 0 10*3/uL (ref 0.0–0.1)
Eosinophils Absolute: 0.1 10*3/uL (ref 0.0–0.5)
HGB: 11 g/dL — ABNORMAL LOW (ref 13.0–17.1)
Immature Retic Fract: 15.7 % — ABNORMAL HIGH (ref 0.00–13.40)
MONO#: 0.3 10*3/uL (ref 0.1–0.9)
NEUT#: 3 10*3/uL (ref 1.5–6.5)
RDW: 17.8 % — ABNORMAL HIGH (ref 11.0–14.6)
Retic Ct Abs: 51.54 10*3/uL (ref 24.10–77.50)
lymph#: 1 10*3/uL (ref 0.9–3.3)

## 2009-06-08 LAB — COMPREHENSIVE METABOLIC PANEL
ALT: 18 U/L (ref 0–53)
AST: 17 U/L (ref 0–37)
Calcium: 9.3 mg/dL (ref 8.4–10.5)
Chloride: 103 mEq/L (ref 96–112)
Creatinine, Ser: 1.18 mg/dL (ref 0.40–1.50)
Potassium: 4.4 mEq/L (ref 3.5–5.3)

## 2009-06-08 LAB — CHCC SMEAR

## 2009-08-09 ENCOUNTER — Ambulatory Visit: Payer: Self-pay | Admitting: Oncology

## 2009-08-13 LAB — CBC WITH DIFFERENTIAL/PLATELET
BASO%: 0.3 % (ref 0.0–2.0)
HCT: 32.7 % — ABNORMAL LOW (ref 38.4–49.9)
MCHC: 34.4 g/dL (ref 32.0–36.0)
MONO#: 0.3 10*3/uL (ref 0.1–0.9)
NEUT#: 3.8 10*3/uL (ref 1.5–6.5)
RBC: 4.06 10*6/uL — ABNORMAL LOW (ref 4.20–5.82)
WBC: 5.3 10*3/uL (ref 4.0–10.3)
lymph#: 1 10*3/uL (ref 0.9–3.3)

## 2009-10-04 ENCOUNTER — Ambulatory Visit: Payer: Self-pay | Admitting: Oncology

## 2009-10-12 LAB — CBC WITH DIFFERENTIAL/PLATELET
BASO%: 0.3 % (ref 0.0–2.0)
Basophils Absolute: 0 10*3/uL (ref 0.0–0.1)
EOS%: 1.8 % (ref 0.0–7.0)
HGB: 11.8 g/dL — ABNORMAL LOW (ref 13.0–17.1)
MCH: 27.9 pg (ref 27.2–33.4)
MCHC: 33.5 g/dL (ref 32.0–36.0)
RDW: 20.1 % — ABNORMAL HIGH (ref 11.0–14.6)
lymph#: 1.2 10*3/uL (ref 0.9–3.3)

## 2009-10-12 LAB — CHCC SMEAR

## 2009-12-07 ENCOUNTER — Ambulatory Visit: Payer: Self-pay | Admitting: Oncology

## 2010-01-22 ENCOUNTER — Ambulatory Visit: Payer: Self-pay | Admitting: Oncology

## 2010-01-24 LAB — MORPHOLOGY: PLT EST: ADEQUATE

## 2010-01-24 LAB — CBC & DIFF AND RETIC
Basophils Absolute: 0 10*3/uL (ref 0.0–0.1)
EOS%: 1.5 % (ref 0.0–7.0)
HGB: 11.9 g/dL — ABNORMAL LOW (ref 13.0–17.1)
LYMPH%: 34.2 % (ref 14.0–49.0)
MCH: 27.2 pg (ref 27.2–33.4)
MCV: 82.6 fL (ref 79.3–98.0)
MONO%: 6.5 % (ref 0.0–14.0)
Platelets: 179 10*3/uL (ref 140–400)
RDW: 17.3 % — ABNORMAL HIGH (ref 11.0–14.6)
Retic Ct Abs: 59.87 10*3/uL (ref 24.10–77.50)

## 2010-01-24 LAB — COMPREHENSIVE METABOLIC PANEL
Albumin: 4.6 g/dL (ref 3.5–5.2)
BUN: 16 mg/dL (ref 6–23)
CO2: 26 mEq/L (ref 19–32)
Calcium: 9.1 mg/dL (ref 8.4–10.5)
Chloride: 101 mEq/L (ref 96–112)
Creatinine, Ser: 1.07 mg/dL (ref 0.40–1.50)
Glucose, Bld: 126 mg/dL — ABNORMAL HIGH (ref 70–99)

## 2010-01-24 LAB — LACTATE DEHYDROGENASE: LDH: 182 U/L (ref 94–250)

## 2010-03-21 ENCOUNTER — Ambulatory Visit: Payer: Self-pay | Admitting: Oncology

## 2010-03-22 LAB — CBC WITH DIFFERENTIAL/PLATELET
BASO%: 0.4 % (ref 0.0–2.0)
EOS%: 1.5 % (ref 0.0–7.0)
MCH: 28 pg (ref 27.2–33.4)
MCHC: 34.3 g/dL (ref 32.0–36.0)
RDW: 18.2 % — ABNORMAL HIGH (ref 11.0–14.6)
lymph#: 1 10*3/uL (ref 0.9–3.3)

## 2010-03-22 LAB — MORPHOLOGY

## 2010-03-22 LAB — CHCC SMEAR

## 2010-05-22 ENCOUNTER — Ambulatory Visit: Payer: Self-pay | Admitting: Oncology

## 2010-07-16 ENCOUNTER — Ambulatory Visit: Payer: Self-pay | Admitting: Oncology

## 2010-07-19 LAB — COMPREHENSIVE METABOLIC PANEL
ALT: 23 U/L (ref 0–53)
Albumin: 4.4 g/dL (ref 3.5–5.2)
CO2: 24 mEq/L (ref 19–32)
Calcium: 9.5 mg/dL (ref 8.4–10.5)
Chloride: 103 mEq/L (ref 96–112)
Sodium: 140 mEq/L (ref 135–145)
Total Protein: 6.6 g/dL (ref 6.0–8.3)

## 2010-07-19 LAB — MORPHOLOGY

## 2010-07-19 LAB — CBC WITH DIFFERENTIAL/PLATELET
BASO%: 0.3 % (ref 0.0–2.0)
EOS%: 1 % (ref 0.0–7.0)
HCT: 37.5 % — ABNORMAL LOW (ref 38.4–49.9)
LYMPH%: 22.1 % (ref 14.0–49.0)
MCH: 27.6 pg (ref 27.2–33.4)
MCHC: 33.8 g/dL (ref 32.0–36.0)
NEUT%: 70.3 % (ref 39.0–75.0)
lymph#: 1.1 10*3/uL (ref 0.9–3.3)

## 2010-07-19 LAB — CHCC SMEAR

## 2010-07-19 LAB — LACTATE DEHYDROGENASE: LDH: 139 U/L (ref 94–250)

## 2010-08-21 ENCOUNTER — Ambulatory Visit: Payer: Self-pay | Admitting: Oncology

## 2010-09-20 ENCOUNTER — Ambulatory Visit: Payer: Self-pay | Admitting: Oncology

## 2010-09-20 LAB — MORPHOLOGY: PLT EST: ADEQUATE

## 2010-09-20 LAB — CBC WITH DIFFERENTIAL/PLATELET
Basophils Absolute: 0 10*3/uL (ref 0.0–0.1)
Eosinophils Absolute: 0 10*3/uL (ref 0.0–0.5)
HGB: 12.4 g/dL — ABNORMAL LOW (ref 13.0–17.1)
NEUT#: 2.9 10*3/uL (ref 1.5–6.5)
RBC: 4.51 10*6/uL (ref 4.20–5.82)
RDW: 17.3 % — ABNORMAL HIGH (ref 11.0–14.6)
WBC: 4.4 10*3/uL (ref 4.0–10.3)
lymph#: 1.2 10*3/uL (ref 0.9–3.3)

## 2010-11-13 ENCOUNTER — Ambulatory Visit: Payer: Self-pay | Admitting: Oncology

## 2010-11-15 LAB — CBC WITH DIFFERENTIAL/PLATELET
BASO%: 0.2 % (ref 0.0–2.0)
Basophils Absolute: 0 10*3/uL (ref 0.0–0.1)
EOS%: 1.1 % (ref 0.0–7.0)
Eosinophils Absolute: 0.1 10*3/uL (ref 0.0–0.5)
HCT: 36 % — ABNORMAL LOW (ref 38.4–49.9)
HGB: 12.3 g/dL — ABNORMAL LOW (ref 13.0–17.1)
LYMPH%: 32.9 % (ref 14.0–49.0)
MCH: 27.7 pg (ref 27.2–33.4)
MCHC: 34.1 g/dL (ref 32.0–36.0)
MCV: 81 fL (ref 79.3–98.0)
MONO#: 0.3 10*3/uL (ref 0.1–0.9)
MONO%: 6.4 % (ref 0.0–14.0)
NEUT#: 2.8 10*3/uL (ref 1.5–6.5)
NEUT%: 59.4 % (ref 39.0–75.0)
Platelets: 260 10*3/uL (ref 140–400)
RBC: 4.44 10*6/uL (ref 4.20–5.82)
RDW: 17.8 % — ABNORMAL HIGH (ref 11.0–14.6)
WBC: 4.8 10*3/uL (ref 4.0–10.3)
lymph#: 1.6 10*3/uL (ref 0.9–3.3)

## 2010-11-15 LAB — MORPHOLOGY: PLT EST: ADEQUATE

## 2011-01-10 ENCOUNTER — Other Ambulatory Visit: Payer: Self-pay | Admitting: Oncology

## 2011-01-10 ENCOUNTER — Encounter (HOSPITAL_BASED_OUTPATIENT_CLINIC_OR_DEPARTMENT_OTHER): Payer: 59 | Admitting: Oncology

## 2011-01-10 DIAGNOSIS — E039 Hypothyroidism, unspecified: Secondary | ICD-10-CM

## 2011-01-10 DIAGNOSIS — D473 Essential (hemorrhagic) thrombocythemia: Secondary | ICD-10-CM

## 2011-01-10 DIAGNOSIS — I10 Essential (primary) hypertension: Secondary | ICD-10-CM

## 2011-01-10 LAB — CBC WITH DIFFERENTIAL/PLATELET
Basophils Absolute: 0 10*3/uL (ref 0.0–0.1)
EOS%: 1.7 % (ref 0.0–7.0)
Eosinophils Absolute: 0.1 10*3/uL (ref 0.0–0.5)
HGB: 13 g/dL (ref 13.0–17.1)
LYMPH%: 24.1 % (ref 14.0–49.0)
MCH: 27.6 pg (ref 27.2–33.4)
MCV: 81.5 fL (ref 79.3–98.0)
MONO%: 5.5 % (ref 0.0–14.0)
NEUT#: 3.3 10*3/uL (ref 1.5–6.5)
Platelets: 253 10*3/uL (ref 140–400)
RBC: 4.7 10*6/uL (ref 4.20–5.82)
RDW: 17.9 % — ABNORMAL HIGH (ref 11.0–14.6)

## 2011-01-10 LAB — COMPREHENSIVE METABOLIC PANEL
ALT: 26 U/L (ref 0–53)
AST: 20 U/L (ref 0–37)
Albumin: 4.8 g/dL (ref 3.5–5.2)
CO2: 24 mEq/L (ref 19–32)
Calcium: 9.5 mg/dL (ref 8.4–10.5)
Chloride: 103 mEq/L (ref 96–112)
Potassium: 4.1 mEq/L (ref 3.5–5.3)
Total Protein: 7.1 g/dL (ref 6.0–8.3)

## 2011-01-10 LAB — LACTATE DEHYDROGENASE: LDH: 170 U/L (ref 94–250)

## 2011-01-10 LAB — MORPHOLOGY: PLT EST: ADEQUATE

## 2011-02-10 LAB — BASIC METABOLIC PANEL
BUN: 16 mg/dL (ref 6–23)
CO2: 27 mEq/L (ref 19–32)
Chloride: 106 mEq/L (ref 96–112)
GFR calc non Af Amer: >60 mL/min (ref >60)
Glucose, Bld: 107 mg/dL — ABNORMAL HIGH (ref 70–99)
Potassium: 3.9 mEq/L (ref 3.5–5.1)
Sodium: 141 mEq/L (ref 135–145)

## 2011-02-10 LAB — DIFFERENTIAL
Basophils Absolute: 0 10*3/uL (ref 0.0–0.1)
Eosinophils Absolute: 0.1 10*3/uL (ref 0.0–0.7)
Eosinophils Relative: 1 % (ref 0–5)
Lymphocytes Relative: 24 % (ref 12–46)
Monocytes Absolute: 0.3 10*3/uL (ref 0.1–1.0)

## 2011-02-10 LAB — URINALYSIS, ROUTINE W REFLEX MICROSCOPIC
Bilirubin Urine: NEGATIVE
Glucose, UA: NEGATIVE mg/dL
Ketones, ur: NEGATIVE mg/dL
Nitrite: NEGATIVE
Protein, ur: NEGATIVE mg/dL
pH: 5.5 (ref 5.0–8.0)

## 2011-02-10 LAB — RAPID URINE DRUG SCREEN, HOSP PERFORMED
Benzodiazepines: POSITIVE — AB
Cocaine: POSITIVE — AB
Tetrahydrocannabinol: NOT DETECTED

## 2011-02-10 LAB — CBC
HCT: 33.1 % — ABNORMAL LOW (ref 39.0–52.0)
Hemoglobin: 11.5 g/dL — ABNORMAL LOW (ref 13.0–17.0)
MCV: 80.9 fL (ref 78.0–100.0)
RDW: 19.3 % — ABNORMAL HIGH (ref 11.5–15.5)

## 2011-02-10 LAB — POCT CARDIAC MARKERS

## 2011-02-14 ENCOUNTER — Encounter (HOSPITAL_BASED_OUTPATIENT_CLINIC_OR_DEPARTMENT_OTHER): Payer: 59 | Admitting: Oncology

## 2011-02-14 ENCOUNTER — Other Ambulatory Visit: Payer: Self-pay | Admitting: Oncology

## 2011-02-14 DIAGNOSIS — E039 Hypothyroidism, unspecified: Secondary | ICD-10-CM

## 2011-02-14 DIAGNOSIS — D473 Essential (hemorrhagic) thrombocythemia: Secondary | ICD-10-CM

## 2011-02-14 DIAGNOSIS — F411 Generalized anxiety disorder: Secondary | ICD-10-CM

## 2011-02-14 DIAGNOSIS — D696 Thrombocytopenia, unspecified: Secondary | ICD-10-CM

## 2011-02-14 DIAGNOSIS — I10 Essential (primary) hypertension: Secondary | ICD-10-CM

## 2011-02-14 LAB — CBC WITH DIFFERENTIAL/PLATELET
BASO%: 0.3 % (ref 0.0–2.0)
EOS%: 1.8 % (ref 0.0–7.0)
HCT: 37.3 % — ABNORMAL LOW (ref 38.4–49.9)
LYMPH%: 26.7 % (ref 14.0–49.0)
MCH: 27.9 pg (ref 27.2–33.4)
MCHC: 34 g/dL (ref 32.0–36.0)
MONO%: 6.1 % (ref 0.0–14.0)
NEUT%: 65.1 % (ref 39.0–75.0)
Platelets: 256 10*3/uL (ref 140–400)

## 2011-02-14 LAB — MORPHOLOGY

## 2011-03-18 NOTE — Op Note (Signed)
NAME:  CATARINO, VOLD NO.:  [id number]   MEDICAL RECORD NO.:  [id number]          PATIENT TYPE:  INP   LOCATION:  1312                         FACILITY:  [facility]   PHYSICIAN:  Coralyn Helling, MD        DATE OF BIRTH:  12/01/1960   DATE OF PROCEDURE:  09/01/2008  DATE OF DISCHARGE:                               OPERATIVE REPORT   PREOPERATIVE DIAGNOSIS:  Hemoptysis.   POSTOPERATIVE DIAGNOSIS:  Hemoptysis.   PROCEDURES:  Bronchoscopy with airway inspection, bronchoalveolar lavage  from the right upper lobe and transbronchial biopsy from the right upper  lobe.   INDICATIONS:  Mr. Chavira is a 50 year old male who has a history of  thrombocythemia and tobacco abuse.  He was noted to have several  episodes of hemoptysis over the last 3 weeks.  He had a chest x-ray and  CT scan of the chest which showed ground-glass opacification in the  right upper lobe.  He is scheduled for bronchoscopy for evaluation of  this.  The procedure was explained to the patient.  The risks were  detailed as bleeding, infection, pneumothorax and no diagnosis.  Consent  form was signed.   DESCRIPTION OF PROCEDURE:  The patient was brought to the bronchoscopy  suite.  He was given a total of 8 mg of Versed and 100 mcg of fentanyl  intravenously for sedation and analgesia.  Cetacaine spray was applied  to the posterior pharynx.  The bronchoscope was entered orally.  The  vocal cords were visualized and appeared to have normal motion.  There  was no obvious lesions here.  Fourteen mL of 2% lidocaine was instilled  during the procedure for topical anesthesia in the airways.  The  bronchoscope was entered in the trachea.  The carina was visualized.  The mucosa appeared pale, but there was no obvious widening of the  carina.  The bronchoscope was then entered into the left main bronchus.  The left upper lingular and lower lobe orifices were all visualized with  good visualization of the subsegmental  bronchi.  There was no obvious  endobronchial lesions.  The bronchoscope was then entered into the right  main bronchus.  The right upper, middle and lower lobe orifices were all  visualized with good visualization of the subsegmental bronchi on this  side as well.  Again, there was no obvious endobronchial lesions.  The  bronchoscope was then entered in the right upper lobe bronchus.  One-  hundred and twenty mL of saline was instilled to the posterior segment  of the right upper lobe with approximately 25 mL of clear to whitish  colored fluid returned.  Then using fluoroscopic guidance,  transbronchial biopsy was obtained from the right upper lobe.  There was  a minimal amount of bleeding which resolved spontaneously.  There was no  other obvious complications.  The patient remained hemodynamically  stable throughout the procedure.  The bronchoscope was then withdrawn.  The patient returned to the recovery room in stable condition.  A  postprocedure chest x-ray is pending at this time.  The specimens  will  be sent for microbiology, cytology and surgical pathology.      Coralyn Helling, MD  Electronically Signed     VS/MEDQ  D:  09/01/2008  T:  09/01/2008  Job:  [id number]

## 2011-03-18 NOTE — Discharge Summary (Signed)
NAME:  Charles Bowman, Charles Bowman NO.:  [id number]   MEDICAL RECORD NO.:  [id number]          PATIENT TYPE:  INP   LOCATION:  1312                         FACILITY:  [facility]   PHYSICIAN:  Leighton Roach. Truett Perna, M.D. DATE OF BIRTH:  1961/01/08   DATE OF ADMISSION:  08/31/2008  DATE OF DISCHARGE:  09/02/2008                               DISCHARGE SUMMARY   ADMISSION DIAGNOSIS:  Hemoptysis with history of myeloproliferative  disorder/essential thrombocytosis.   HISTORY OF PRESENT ILLNESS:  For details of the history of present  illness, please see the history and physical dated August 31, 2008.  Briefly, Charles Bowman has a history of a myeloproliferative disorder,  thrombocythemia.  He has been treated with anagrelide therapy for a  number of years.  Over the past several weeks, he began having  hemoptysis.  He was seen by his primary care physician, who referred him  for endoscopy, which was reported as normal.  He was then found to have  a decrease in his hemoglobin and was evaluated by Charles Bowman, his  oncologist.  He underwent chest x-ray and subsequently, a CT scan of the  chest.  The CT scan showed patchy air space, ground glass opacities of  the right lung, and when he was seen on 10/29, his hemoglobin had  decreased to 7.8 gm/dl.  Because of concern of hemoptysis, he was  admitted to the inpatient service to proceed with bronchoscopy.   PAST MEDICAL HISTORY:  1. Proliferative disorder/thrombocythemia.  2. History of hypertension.  3. Hypothyroidism.   PROCEDURES DURING THIS HOSPITALIZATION:  Bronchoscopy on 09/02/2008.   HOSPITAL COURSE:  Charles Bowman was admitted to undergo bronchoscopy  secondary to a several-week history of hemoptysis.  He tolerated the  procedure without any difficulty.  He remained stable throughout his  hospitalization.  His hemoglobin remained stable at 7.6 on admission,  7.7 on the day of discharge, September 02, 2008.  He is feeling well.  He  denied any further hemoptysis.  He had no fevers, nausea, vomiting,  diarrhea, or shortness of breath.  The preliminary results of  bronchoscopy showed no lesions.  Cultures and cytology were pending.  He  is therefore deemed stable for discharge.  He was discharged home the  morning of September 02, 2008 for close followup with his local  oncologist, Charles Bowman.   DISCHARGE MEDICATIONS:  1. Anagrelide 2 mg twice daily.  2. Cardizem daily.  3. Ferrous sulfate 325 mg 3 times daily.  4. Hydrochlorothiazide 12.5 mg daily.  5. Synthroid 125 mcg daily.  6. Xanax 0.5 mg every 6 hours as needed.   DISCHARGE INSTRUCTIONS:  Charles Bowman was discharged home to resume  regular activity.  He is to see Charles Bowman the week of September 03, 2008.  He knows to seek medical attention should have any questions,  problems, or concerns prior to this time, particularly any further  episodes of hemoptysis or any unusual bleeding.     ______________________________  Elray Mcgregor, NP      Leighton Roach. Truett Perna, M.D.  Electronically Signed  ML/MEDQ  D:  09/02/2008  T:  09/02/2008  Job:  [id number]

## 2011-03-18 NOTE — Consult Note (Signed)
NAME:  Charles Bowman, Charles Bowman NO.:  [id number]   MEDICAL RECORD NO.:  [id number]          PATIENT TYPE:  INP   LOCATION:  1312                         FACILITY:  [facility]   PHYSICIAN:  Coralyn Helling, MD        DATE OF BIRTH:  12-01-1960   DATE OF CONSULTATION:  DATE OF DISCHARGE:                                 CONSULTATION   REASON FOR CONSULTATION:  Hemoptysis.   HISTORY OF PRESENT ILLNESS:  Mr. Asch is a 50 year old male who states  that on October 9, he developed a cough productive of bloody sputum.  He  said there was more frank blood and not so much sputum.  He had 1-2  episodes like this on a day, and then went several days before having  another episode.  He then had gone several more days without an episode,  and then says that he has not had any episodes of hemoptysis for the  last 4-5 days.  He was initially treated with Omnicef.  He says that on  each episodes prior to having the hemoptysis, he had taken a package of  [employer] [place].  He denies any epistaxis or sinus congestion.  He denies  any difficulty with his swallowing or hoarseness, although he sounds  hoarse during our conversation.  He has not had any recent fevers,  chills, sweats or weight loss.  He has not had any sick exposures or  occupational exposures.  He does smoke cigarettes.  He denies any chest  pains, palpitations, wheezing, abdominal pain, nausea, vomiting or  diarrhea.  He denies hematemesis.  He was seen by Gastroenterology and  had an EGD done recently which was unremarkable.  He had a CT scan of  his chest done on October 11 which showed patchy air space and ground-  glass opacities in the left upper and middle lung fields.  He had a  chest x-ray earlier today with patchy opacities in the right upper lung  fields as well as atelectasis in the right middle lobe.  Mr. Likes also  has a history of thrombocythemia.  He was diagnosed with this in his  early 64s.  He was originally treated  with the busulfan.  After this, he  was treated with another medication, but he cannot recall which  medication this was.  He has most recently been on treatment for this  with anagrelide, but says that he has been on this for quite some time.   PAST MEDICAL HISTORY:  1. Hypertension.  2. Thyroid disease.  3. Myeloproliferative disorder with thrombocythemia.  4. Anemia.   OUTPATIENT MEDICATIONS:  1. Cardizem.  2. Hydrochlorothiazide.  3. Unithroid.  4. Anagrelide  5. Ferrous sulfate.  6. Xanax.  7. Multivitamin.  8. Motrin.   PAST SURGICAL HISTORY:  Unremarkable.   FAMILY HISTORY:  Significant for his mother had COPD and father had  prostate cancer and dementia.   SOCIAL HISTORY:  He is currently unemployed.  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  He smokes one packs of cigarettes per  day.  There is no significant history of alcohol or substance abuse.   REVIEW OF SYSTEMS:  Unremarkable except for as stated above.   PHYSICAL EXAMINATION:  He is seen in this hospital room.  He is awake,  alert and oriented.  Does not appear to be in acute distress.  VITAL SIGNS:  Unremarkable.  HEENT:  Pupils reactive.  Extraocular movements intact. There is no  sinus tenderness.  No nasal discharge.  There is no oral lesions.  There  is no lymphadenopathy, no thyromegaly.  No jugular distention.  HEART:  S1-S2 regular rhythm.  No murmurs.  CHEST:  There was normal air entry and no wheezing or rales.  ABDOMEN:  Soft, nontender, positive bowel sounds.  No organomegaly.  EXTREMITIES:  There was no edema, cyanosis, clubbing.  Peripheral pulses  were palpable and symmetric.  NEUROLOGIC:  Cranial nerves intact.  He had normal strength, and no  sensory deficits were appreciated.   STUDIES:  Chest x-ray and CT scan of the chest as stated above.  Hemoglobin was 7.8, hematocrit 23.9, platelet count was 1129.  WBC 10.1.  Other laboratory tests are pending.   IMPRESSION:  1.  Hemoptysis in the setting of tobacco abuse and abnormalities on his      CT scan and chest x-ray as stated above.  The question is whether      this is infectious versus inflammatory versus endobronchial      malignancy.  He does not have symptoms to suggest mycobacterial      infection, and therefore, I do not think he needs to have      respiratory isolation to further assess this.  I have scheduled him      for bronchoscopy which is to be done on October 30 at 3:00 p.m.      The patient will be n.p.o. prior to the procedure.  I have      explained the procedure to the patient.  The risks were detailed as      bleeding, infection, pneumothorax and nondiagnostic.  In the      meantime, I have discussed the importance of smoking cessation with      the patient, then further recommendations with this will be made      after results of his bronchoscopy are available.  2. Hoarseness.  It is possible he could also have an upper airway      lesion which could be contributing to his symptoms of hemoptysis,      and this will be further assessed at the time of bronchoscopy.  3. Mild proliferative disorder with thrombocytasthenia.  He is to      continue on anagrelide per Dr. Donnie Coffin.  4. Anemia.  He is continue on ferrous sulfate per Dr. Donnie Coffin.  5. Hypertension.  He needs to continue on Cardizem and      hydrochlorothiazide.  6. Thyroid disease.  He is continue on Unithroid.  7. Tobacco abuse.  Again, I have emphasized to him the importance of      smoking cessation.      Coralyn Helling, MD  Electronically Signed     VS/MEDQ  D:  08/31/2008  T:  08/31/2008  Job:  [id number]

## 2011-03-18 NOTE — H&P (Signed)
NAME:  Charles Bowman, Charles Bowman NO.:  [id number]   MEDICAL RECORD NO.:  [id number]          PATIENT TYPE:  INP   LOCATION:  1312                         FACILITY:  [facility]   PHYSICIAN:  Pierce Crane, MD        DATE OF BIRTH:  07-12-1961   DATE OF ADMISSION:  08/31/2008  DATE OF DISCHARGE:                              HISTORY & PHYSICAL   PROBLEM:  History of thrombocythemia with recent hemoptysis.   Charles Bowman is a pleasant 50 year old gentleman with a known history of  thrombocythemia.  He has been under anagrelide therapy for a number of  years now and has been having stable counts.  He also has a history of  hypertension.  Over the past few weeks he had starting taking more Goody  Powder and noticed he was coughing up some blood.  He ultimately was  seen by his primary care doctor who referred him for endoscopy which he  undertook on August 22, 2008.  This was actually quite normal.  His  hemoglobin has subsequently fallen to 9.4 then to 7.9.  We had contacted  him about this and he reassured [place] that a lot of the bleeding had  resolved.  He was pretty sure that he was coughing and then producing  blood when with that.  He denied any blood per rectum, though he  recently started some oral iron.  This past weekend he took some more  Goody Powder and then starting having more problems coughing and then  coughed up at least 1 or 2 cups of bright red blood.  He has in the past  had a chest x-ray and a subsequent CT scan of the chest.  The CT showed  patchy airspace and ground-glass opacities of the right lung described  as likely inflammatory process such as bronchiolitis.  He returned today  for a CBC and his white count was 10, his hemoglobin is 7.8, platelet  count of 1129.  He continues taking anagrelide alternating 3 and 4 mg.  We have also checked his retic count which is elevated at 5%.  His iron  studies showed a ferritin of 60 with a serum iron of 11 and a saturation  of 4%.  Because of the concern of hemoptysis he has been referred to  Casimiro Needle B. Wert, MD, FCCP and will be seeing him in about a weeks time.  I did discuss the situation with Casimiro Needle B. Sherene Sires, MD, FCCP and he agreed  to see him tomorrow for a possible bronchoscopy if we admitted him  today.  As such, he is being admitted today.   PAST MEDICAL HISTORY:  Charles Bowman is otherwise in good health.  1. He has a longstanding history of thrombocythemia and has been      treated for that since about 2001.  2. He has a history of hypertension.  3. Hypothyroidism.  He has been on thyroid replacement.  4. He is a smoker and has smoked for at least 35 years.   He is on no other medications.   SOCIAL HISTORY:  Noncontributory.  He is married.  As noted, he  continues to smoke about a pack a day.  He drinks moderately.   ALLERGIES:  None.   PHYSICAL EXAMINATION:  Today, he is alert and oriented in no acute  distress.  His oropharynx appears normal.  His lungs are clear.  Heart sounds are normal.  His abdomen is soft.  There is no palpable hepatosplenomegaly.  No  inguinal adenopathy.  No peripheral edema.   CBC done today, white count of 10, hemoglobin is 7.8, platelet count as  noted.  Repeat chest x-ray was done today which shows persistent airspace  filling posterior aspect of right upper lobe, atelectasis, infiltrate in  the right middle lobe.   IMPRESSION AND PLAN:  Charles Bowman will be admitted to the hospital.  The  etiology of his infiltrates are unclear.  He has been on Omnicef in the  past.  He has not had fever or a cough apart from when he was coughing  up blood.  He denies any shortness of breath.  I think he needs a  bronchoscopy.  Again, pulmonary has been advised of this admission and  hopefully they can get the bronchoscopy done in the morning.      Pierce Crane, MD  Electronically Signed     PR/MEDQ  D:  08/31/2008  T:  08/31/2008  Job:  [id number]   cc:   Pearline Cables, MD  Fax: [phone]   Danise Edge, M.D.  Fax: [phone]

## 2011-06-13 ENCOUNTER — Other Ambulatory Visit: Payer: Self-pay | Admitting: Oncology

## 2011-06-13 ENCOUNTER — Encounter: Payer: 59 | Admitting: Oncology

## 2011-06-13 LAB — CBC WITH DIFFERENTIAL/PLATELET
BASO%: 0.3 % (ref 0.0–2.0)
HCT: 38.4 % (ref 38.4–49.9)
HGB: 13.1 g/dL (ref 13.0–17.1)
MCHC: 34.1 g/dL (ref 32.0–36.0)
MONO#: 0.3 10*3/uL (ref 0.1–0.9)
NEUT%: 63.7 % (ref 39.0–75.0)
WBC: 4.3 10*3/uL (ref 4.0–10.3)
lymph#: 1.2 10*3/uL (ref 0.9–3.3)

## 2011-06-13 LAB — MORPHOLOGY: PLT EST: ADEQUATE

## 2011-08-05 LAB — LEGIONELLA PROFILE(CULTURE+DFA/SMEAR): Legionella Antigen (DFA): NEGATIVE

## 2011-08-05 LAB — DIFFERENTIAL
Basophils Absolute: 0 10*3/uL (ref 0.0–0.1)
Basophils Relative: 0 % (ref 0–1)
Lymphocytes Relative: 20 % (ref 12–46)
Neutro Abs: 6 10*3/uL (ref 1.7–7.7)
Neutrophils Relative %: 73 % (ref 43–77)

## 2011-08-05 LAB — COMPREHENSIVE METABOLIC PANEL
ALT: 20 U/L (ref 0–53)
AST: 19 U/L (ref 0–37)
Albumin: 2.9 g/dL — ABNORMAL LOW (ref 3.5–5.2)
Alkaline Phosphatase: 86 U/L (ref 39–117)
BUN: 14 mg/dL (ref 6–23)
BUN: 14 mg/dL (ref 6–23)
Calcium: 8.6 mg/dL (ref 8.4–10.5)
Chloride: 108 mEq/L (ref 96–112)
Chloride: 108 mEq/L (ref 96–112)
Creatinine, Ser: 0.94 mg/dL (ref 0.4–1.5)
GFR calc Af Amer: >60 mL/min (ref >60)
Glucose, Bld: 115 mg/dL — ABNORMAL HIGH (ref 70–99)
Potassium: 4.8 mEq/L (ref 3.5–5.1)
Sodium: 141 mEq/L (ref 135–145)
Total Bilirubin: 0.4 mg/dL (ref 0.3–1.2)
Total Protein: 5.9 g/dL — ABNORMAL LOW (ref 6.0–8.3)
Total Protein: 5.9 g/dL — ABNORMAL LOW (ref 6.0–8.3)

## 2011-08-05 LAB — FUNGUS CULTURE W SMEAR: Fungal Smear: NONE SEEN

## 2011-08-05 LAB — AFB CULTURE WITH SMEAR (NOT AT ARMC)

## 2011-08-05 LAB — CBC
HCT: 23.5 % — ABNORMAL LOW (ref 39.0–52.0)
Hemoglobin: 7.6 g/dL — CL (ref 13.0–17.0)
MCHC: 33.2 g/dL (ref 30.0–36.0)
Platelets: 819 10*3/uL — ABNORMAL HIGH (ref 150–400)
RBC: 2.86 MIL/uL — ABNORMAL LOW (ref 4.22–5.81)
RDW: 19.3 % — ABNORMAL HIGH (ref 11.5–15.5)
RDW: 19.4 % — ABNORMAL HIGH (ref 11.5–15.5)
WBC: 7.7 10*3/uL (ref 4.0–10.5)

## 2011-08-05 LAB — PROTIME-INR: INR: 1 (ref 0.00–1.49)

## 2011-08-05 LAB — CULTURE, RESPIRATORY W GRAM STAIN

## 2011-08-05 LAB — APTT: aPTT: 35 seconds (ref 24–37)

## 2011-08-15 ENCOUNTER — Encounter (HOSPITAL_BASED_OUTPATIENT_CLINIC_OR_DEPARTMENT_OTHER): Payer: 59 | Admitting: Oncology

## 2011-08-15 ENCOUNTER — Other Ambulatory Visit: Payer: Self-pay | Admitting: Oncology

## 2011-08-15 DIAGNOSIS — I10 Essential (primary) hypertension: Secondary | ICD-10-CM

## 2011-08-15 DIAGNOSIS — D696 Thrombocytopenia, unspecified: Secondary | ICD-10-CM

## 2011-08-15 DIAGNOSIS — E039 Hypothyroidism, unspecified: Secondary | ICD-10-CM

## 2011-08-15 DIAGNOSIS — F411 Generalized anxiety disorder: Secondary | ICD-10-CM

## 2011-08-15 LAB — CBC WITH DIFFERENTIAL/PLATELET
Basophils Absolute: 0 10*3/uL (ref 0.0–0.1)
EOS%: 1.7 % (ref 0.0–7.0)
Eosinophils Absolute: 0.1 10*3/uL (ref 0.0–0.5)
HCT: 36.3 % — ABNORMAL LOW (ref 38.4–49.9)
HGB: 12.3 g/dL — ABNORMAL LOW (ref 13.0–17.1)
MCH: 27.9 pg (ref 27.2–33.4)
MONO#: 0.2 10*3/uL (ref 0.1–0.9)
NEUT#: 2.5 10*3/uL (ref 1.5–6.5)
RDW: 17.4 % — ABNORMAL HIGH (ref 11.0–14.6)
WBC: 3.8 10*3/uL — ABNORMAL LOW (ref 4.0–10.3)
lymph#: 1 10*3/uL (ref 0.9–3.3)

## 2011-08-15 LAB — MORPHOLOGY

## 2011-08-28 ENCOUNTER — Emergency Department (HOSPITAL_COMMUNITY): Payer: 59

## 2011-08-28 ENCOUNTER — Emergency Department (HOSPITAL_COMMUNITY)
Admission: EM | Admit: 2011-08-28 | Discharge: 2011-08-28 | Disposition: A | Discharge status: Home / Self Care | Payer: 59 | Attending: Emergency Medicine | Admitting: Emergency Medicine

## 2011-08-28 DIAGNOSIS — I10 Essential (primary) hypertension: Secondary | ICD-10-CM | POA: Insufficient documentation

## 2011-08-28 DIAGNOSIS — R209 Unspecified disturbances of skin sensation: Secondary | ICD-10-CM | POA: Insufficient documentation

## 2011-08-28 DIAGNOSIS — M5412 Radiculopathy, cervical region: Secondary | ICD-10-CM | POA: Insufficient documentation

## 2011-08-28 DIAGNOSIS — Z79899 Other long term (current) drug therapy: Secondary | ICD-10-CM | POA: Insufficient documentation

## 2011-08-28 DIAGNOSIS — R079 Chest pain, unspecified: Secondary | ICD-10-CM | POA: Insufficient documentation

## 2011-08-28 DIAGNOSIS — E039 Hypothyroidism, unspecified: Secondary | ICD-10-CM | POA: Insufficient documentation

## 2011-08-28 LAB — URINALYSIS, ROUTINE W REFLEX MICROSCOPIC
Bilirubin Urine: NEGATIVE
Ketones, ur: NEGATIVE mg/dL
Nitrite: NEGATIVE
Protein, ur: NEGATIVE mg/dL
pH: 6.5 (ref 5.0–8.0)

## 2011-08-28 LAB — CBC
Hemoglobin: 12.3 g/dL — ABNORMAL LOW (ref 13.0–17.0)
MCH: 26.9 pg (ref 26.0–34.0)
MCV: 81.6 fL (ref 78.0–100.0)
RBC: 4.57 MIL/uL (ref 4.22–5.81)

## 2011-08-28 LAB — COMPREHENSIVE METABOLIC PANEL
BUN: 15 mg/dL (ref 6–23)
CO2: 28 mEq/L (ref 19–32)
Calcium: 10.5 mg/dL (ref 8.4–10.5)
Creatinine, Ser: 0.85 mg/dL (ref 0.50–1.35)
GFR calc Af Amer: >90 mL/min (ref >90)
GFR calc non Af Amer: >90 mL/min (ref >90)
Glucose, Bld: 116 mg/dL — ABNORMAL HIGH (ref 70–99)

## 2011-08-28 LAB — DIFFERENTIAL
Eosinophils Absolute: 0.1 10*3/uL (ref 0.0–0.7)
Lymphs Abs: 1 10*3/uL (ref 0.7–4.0)
Monocytes Relative: 8 % (ref 3–12)
Neutro Abs: 2.9 10*3/uL (ref 1.7–7.7)
Neutrophils Relative %: 68 % (ref 43–77)

## 2011-12-06 ENCOUNTER — Telehealth: Payer: Self-pay | Admitting: *Deleted

## 2011-12-06 NOTE — Telephone Encounter (Signed)
A message was left to notify Pt of future appts on 02/13/2011

## 2012-01-21 ENCOUNTER — Other Ambulatory Visit: Payer: Self-pay | Admitting: Certified Registered Nurse Anesthetist

## 2012-01-21 DIAGNOSIS — D473 Essential (hemorrhagic) thrombocythemia: Secondary | ICD-10-CM

## 2012-01-21 MED ORDER — ALPRAZOLAM 0.5 MG PO TABS: 0.5000 mg | ORAL_TABLET | Freq: Three times a day (TID) | ORAL | Status: DC | PRN

## 2012-02-13 ENCOUNTER — Other Ambulatory Visit (HOSPITAL_BASED_OUTPATIENT_CLINIC_OR_DEPARTMENT_OTHER): Payer: 59 | Admitting: Lab

## 2012-02-13 ENCOUNTER — Ambulatory Visit (HOSPITAL_BASED_OUTPATIENT_CLINIC_OR_DEPARTMENT_OTHER): Payer: 59 | Admitting: Oncology

## 2012-02-13 ENCOUNTER — Other Ambulatory Visit: Payer: Self-pay | Admitting: *Deleted

## 2012-02-13 ENCOUNTER — Telehealth: Payer: Self-pay | Admitting: *Deleted

## 2012-02-13 VITALS — BP 143/72 | HR 98 | Temp 98.3°F | Ht 67.0 in | Wt 185.9 lb

## 2012-02-13 DIAGNOSIS — D473 Essential (hemorrhagic) thrombocythemia: Secondary | ICD-10-CM

## 2012-02-13 LAB — COMPREHENSIVE METABOLIC PANEL WITH GFR
ALT: 24 U/L (ref 0–53)
AST: 21 U/L (ref 0–37)
Albumin: 4.1 g/dL (ref 3.5–5.2)
Alkaline Phosphatase: 105 U/L (ref 39–117)
BUN: 18 mg/dL (ref 6–23)
CO2: 26 meq/L (ref 19–32)
Calcium: 9.5 mg/dL (ref 8.4–10.5)
Chloride: 102 meq/L (ref 96–112)
Creatinine, Ser: 1.08 mg/dL (ref 0.50–1.35)
Glucose, Bld: 160 mg/dL — ABNORMAL HIGH (ref 70–99)
Potassium: 4.1 meq/L (ref 3.5–5.3)
Sodium: 137 meq/L (ref 135–145)
Total Bilirubin: 0.3 mg/dL (ref 0.3–1.2)
Total Protein: 6.7 g/dL (ref 6.0–8.3)

## 2012-02-13 LAB — CBC & DIFF AND RETIC
BASO%: 0.3 % (ref 0.0–2.0)
LYMPH%: 27.5 % (ref 14.0–49.0)
MCHC: 33.3 g/dL (ref 32.0–36.0)
MONO#: 0.3 10*3/uL (ref 0.1–0.9)
Platelets: 195 10*3/uL (ref 140–400)
RBC: 4.6 10*6/uL (ref 4.20–5.82)
Retic %: 2.1 % — ABNORMAL HIGH (ref 0.80–1.80)
WBC: 3.9 10*3/uL — ABNORMAL LOW (ref 4.0–10.3)
lymph#: 1.1 10*3/uL (ref 0.9–3.3)

## 2012-02-13 LAB — LACTATE DEHYDROGENASE: LDH: 169 U/L (ref 94–250)

## 2012-02-13 LAB — URIC ACID: Uric Acid, Serum: 10 mg/dL — ABNORMAL HIGH (ref 4.0–7.8)

## 2012-02-13 LAB — CHCC SMEAR

## 2012-02-13 LAB — MORPHOLOGY

## 2012-02-13 MED ORDER — ALPRAZOLAM 0.5 MG PO TABS: 0.5000 mg | ORAL_TABLET | Freq: Three times a day (TID) | ORAL | Status: AC | PRN

## 2012-02-13 MED ORDER — ANAGRELIDE HCL 1 MG PO CAPS: 1.0000 mg | ORAL_CAPSULE | Freq: Three times a day (TID) | ORAL | Status: DC

## 2012-02-13 MED ORDER — ALPRAZOLAM 0.5 MG PO TABS: 0.5000 mg | ORAL_TABLET | Freq: Three times a day (TID) | ORAL | Status: DC | PRN

## 2012-02-13 NOTE — Progress Notes (Signed)
Addended by: Pierce Crane on: 02/13/2012 02:42 PM   Modules accepted: Orders

## 2012-02-13 NOTE — Progress Notes (Signed)
Hematology and Oncology Follow Up Visit  Charles Bowman [id number] December 08, 1960 51 y.o. 02/13/2012 2:15 PM PCP  Principle Diagnosis:  Hx of thrombocythemia on anagrelide since 2005, initial diagnosis at age 53 with stable counts.  Interim History:  There have been no intercurrent illness, hospitalizations or medication changes. His father passed away recently, he is working out more.  Medications: I have reviewed the patient's current medications.  Allergies: No Known Allergies  Past Medical History, Surgical history, Social history, and Family History were reviewed and updated.  Review of Systems: Constitutional:  Negative for fever, chills, night sweats, anorexia, weight loss, pain. Cardiovascular: no chest pain or dyspnea on exertion Respiratory: no cough, shortness of breath, or wheezing Neurological: negative Dermatological: negative ENT: negative Skin Gastrointestinal: negative Genito-Urinary: negative Hematological and Lymphatic: negative Breast: negative Musculoskeletal: negative Remaining ROS negative.  Physical Exam: Blood pressure 143/72, pulse 98, temperature 98.3 F (36.8 C), height 5\' 7"  (1.702 m), weight 185 lb 14.4 oz (84.324 kg). ECOG: 0 General appearance: alert, cooperative and appears stated age Head: Normocephalic, without obvious abnormality, atraumatic Neck: no adenopathy, no carotid bruit, no JVD, supple, symmetrical, trachea midline and thyroid not enlarged, symmetric, no tenderness/mass/nodules Lymph nodes: Cervical, supraclavicular, and axillary nodes normal. Cardiac : regular rate and rhythm, no murmurs or gallops Pulmonary:clear to auscultation bilaterally and normal percussion bilaterally Breasts: inspection negative, no nipple discharge or bleeding, no masses or nodularity palpable Abdomen:soft, non-tender; bowel sounds normal; no masses,  no organomegaly Extremities negative Neuro: alert, oriented, normal speech, no focal findings or movement  disorder noted  Lab Results: Lab Results  Component Value Date   WBC 3.9* 02/13/2012   HGB 12.1* 02/13/2012   HCT 36.3* 02/13/2012   MCV 78.9* 02/13/2012   PLT 195 02/13/2012     Chemistry      Component Value Date/Time   NA 136 08/28/2011 1020   K 3.9 08/28/2011 1020   CL 98 08/28/2011 1020   CO2 28 08/28/2011 1020   BUN 15 08/28/2011 1020   CREATININE 0.85 08/28/2011 1020      Component Value Date/Time   CALCIUM 10.5 08/28/2011 1020   ALKPHOS 112 08/28/2011 1020   AST 23 08/28/2011 1020   ALT 32 08/28/2011 1020   BILITOT 0.3 08/28/2011 1020      .pathology. Radiological Studies: chest X-ray n/a Mammogram n/a Bone density n/a  Impression and Plan: 51 yo long term hx of thrombocythemia , on anagrelide. Will f/u 6 months.. Likely plan for interim bone marrow   More than 50% of the visit was spent in patient-related counselling   Pierce Crane, MD 4/12/20132:15 PM

## 2012-02-13 NOTE — Telephone Encounter (Signed)
gave patient appointment for 04-2012 [phone] 09-2012 printed out calendar and gave to the patient

## 2012-02-20 ENCOUNTER — Other Ambulatory Visit: Payer: Self-pay | Admitting: Physician Assistant

## 2012-03-05 ENCOUNTER — Ambulatory Visit: Payer: 59 | Admitting: Oncology

## 2012-04-16 ENCOUNTER — Other Ambulatory Visit (HOSPITAL_BASED_OUTPATIENT_CLINIC_OR_DEPARTMENT_OTHER): Payer: 59 | Admitting: Lab

## 2012-04-16 DIAGNOSIS — D473 Essential (hemorrhagic) thrombocythemia: Secondary | ICD-10-CM

## 2012-04-16 LAB — CBC WITH DIFFERENTIAL/PLATELET
BASO%: 0.2 % (ref 0.0–2.0)
EOS%: 1.4 % (ref 0.0–7.0)
MCH: 26.8 pg — ABNORMAL LOW (ref 27.2–33.4)
MCHC: 33.2 g/dL (ref 32.0–36.0)
RBC: 4.57 10*6/uL (ref 4.20–5.82)
RDW: 18.4 % — ABNORMAL HIGH (ref 11.0–14.6)
lymph#: 1.1 10*3/uL (ref 0.9–3.3)

## 2012-06-16 ENCOUNTER — Other Ambulatory Visit: Payer: Self-pay

## 2012-06-16 DIAGNOSIS — D473 Essential (hemorrhagic) thrombocythemia: Secondary | ICD-10-CM

## 2012-06-16 MED ORDER — ALPRAZOLAM 0.5 MG PO TABS: 0.5000 mg | ORAL_TABLET | Freq: Four times a day (QID) | ORAL | Status: AC | PRN

## 2012-06-18 ENCOUNTER — Other Ambulatory Visit: Payer: Self-pay | Admitting: Oncology

## 2012-06-18 ENCOUNTER — Other Ambulatory Visit: Payer: 59 | Admitting: Lab

## 2012-06-18 DIAGNOSIS — D473 Essential (hemorrhagic) thrombocythemia: Secondary | ICD-10-CM

## 2012-06-18 LAB — CBC WITH DIFFERENTIAL/PLATELET
Basophils Absolute: 0 10*3/uL (ref 0.0–0.1)
EOS%: 2.3 % (ref 0.0–7.0)
Eosinophils Absolute: 0.1 10*3/uL (ref 0.0–0.5)
HGB: 12.4 g/dL — ABNORMAL LOW (ref 13.0–17.1)
MCH: 26.8 pg — ABNORMAL LOW (ref 27.2–33.4)
NEUT#: 2.7 10*3/uL (ref 1.5–6.5)
RDW: 18.4 % — ABNORMAL HIGH (ref 11.0–14.6)
lymph#: 1 10*3/uL (ref 0.9–3.3)

## 2012-06-22 ENCOUNTER — Other Ambulatory Visit: Payer: Self-pay | Admitting: Emergency Medicine

## 2012-06-23 ENCOUNTER — Other Ambulatory Visit: Payer: Self-pay | Admitting: Physician Assistant

## 2012-06-23 MED ORDER — LEVOTHYROXINE SODIUM 150 MCG PO TABS: 150.0000 ug | ORAL_TABLET | Freq: Every day | ORAL | Status: DC

## 2012-06-23 NOTE — Telephone Encounter (Signed)
Chart pulled [id number]

## 2012-07-19 ENCOUNTER — Other Ambulatory Visit: Payer: Self-pay | Admitting: Physician Assistant

## 2012-07-19 NOTE — Telephone Encounter (Signed)
#  15 only--needs OV--2nd notice

## 2012-09-15 ENCOUNTER — Other Ambulatory Visit: Payer: Self-pay | Admitting: Physician Assistant

## 2012-09-15 NOTE — Telephone Encounter (Signed)
Chart pulled to PA pool at nurses station [phone]

## 2012-09-17 ENCOUNTER — Other Ambulatory Visit (HOSPITAL_BASED_OUTPATIENT_CLINIC_OR_DEPARTMENT_OTHER): Payer: 59

## 2012-09-17 ENCOUNTER — Other Ambulatory Visit: Payer: Self-pay | Admitting: *Deleted

## 2012-09-17 ENCOUNTER — Ambulatory Visit (HOSPITAL_BASED_OUTPATIENT_CLINIC_OR_DEPARTMENT_OTHER): Payer: 59 | Admitting: Oncology

## 2012-09-17 VITALS — BP 144/72 | HR 53 | Temp 97.8°F | Resp 20 | Ht 67.0 in | Wt 192.0 lb

## 2012-09-17 DIAGNOSIS — D473 Essential (hemorrhagic) thrombocythemia: Secondary | ICD-10-CM

## 2012-09-17 LAB — COMPREHENSIVE METABOLIC PANEL (CC13)
AST: 24 U/L (ref 5–34)
Albumin: 4.1 g/dL (ref 3.5–5.0)
BUN: 19 mg/dL (ref 7.0–26.0)
Calcium: 9.7 mg/dL (ref 8.4–10.4)
Chloride: 105 mEq/L (ref 98–107)
Glucose: 140 mg/dl — ABNORMAL HIGH (ref 70–99)
Potassium: 4.3 mEq/L (ref 3.5–5.1)

## 2012-09-17 LAB — CHCC SMEAR

## 2012-09-17 LAB — CBC & DIFF AND RETIC
Basophils Absolute: 0 10*3/uL (ref 0.0–0.1)
Eosinophils Absolute: 0.1 10*3/uL (ref 0.0–0.5)
HGB: 12.7 g/dL — ABNORMAL LOW (ref 13.0–17.1)
LYMPH%: 29.6 % (ref 14.0–49.0)
MCV: 81 fL (ref 79.3–98.0)
MONO%: 5.8 % (ref 0.0–14.0)
NEUT#: 2.8 10*3/uL (ref 1.5–6.5)
Platelets: 144 10*3/uL (ref 140–400)

## 2012-09-17 LAB — MORPHOLOGY: PLT EST: DECREASED

## 2012-09-17 MED ORDER — ANAGRELIDE HCL 1 MG PO CAPS: 1.0000 mg | ORAL_CAPSULE | Freq: Three times a day (TID) | ORAL | Status: DC

## 2012-09-17 MED ORDER — ALPRAZOLAM 0.5 MG PO TABS: 0.5000 mg | ORAL_TABLET | Freq: Two times a day (BID) | ORAL | Status: DC | PRN

## 2012-09-17 NOTE — Telephone Encounter (Signed)
Gave patient appointments every two months lab only and then lab and md in six months

## 2012-09-19 NOTE — Progress Notes (Signed)
Hematology and Oncology Follow Up Visit  Charles Bowman [id number] 27-Jun-1961 51 y.o. 09/19/2012 10:05 PM PCP  Principle Diagnosis:  Hx of thrombocythemia on anagrelide since 2005, initial diagnosis at age 79 with stable counts.  Interim History:  There have been no intercurrent illness, hospitalizations or medication changes. His father passed away recently, he is working out more. He is working full time and has no complaints.  Medications: I have reviewed the patient's current medications.  Allergies: No Known Allergies  Past Medical History, Surgical history, Social history, and Family History were reviewed and updated.  Review of Systems: Constitutional:  Negative for fever, chills, night sweats, anorexia, weight loss, pain. Cardiovascular: no chest pain or dyspnea on exertion Respiratory: no cough, shortness of breath, or wheezing Neurological: negative Dermatological: negative ENT: negative Skin Gastrointestinal: negative Genito-Urinary: negative Hematological and Lymphatic: negative Breast: negative Musculoskeletal: negative Remaining ROS negative.  Physical Exam: Blood pressure 144/72, pulse 53, temperature 97.8 F (36.6 C), resp. rate 20, height 5\' 7"  (1.702 m), weight 192 lb (87.091 kg). ECOG: 0 General appearance: alert, cooperative and appears stated age Head: Normocephalic, without obvious abnormality, atraumatic Neck: no adenopathy, no carotid bruit, no JVD, supple, symmetrical, trachea midline and thyroid not enlarged, symmetric, no tenderness/mass/nodules Lymph nodes: Cervical, supraclavicular, and axillary nodes normal. Cardiac : regular rate and rhythm, no murmurs or gallops Pulmonary:clear to auscultation bilaterally and normal percussion bilaterally Breasts: inspection negative, no nipple discharge or bleeding, no masses or nodularity palpable Abdomen:soft, non-tender; bowel sounds normal; no masses,  no organomegaly Extremities negative Neuro: alert,  oriented, normal speech, no focal findings or movement disorder noted  Lab Results: Lab Results  Component Value Date   WBC 4.5 09/17/2012   HGB 12.7* 09/17/2012   HCT 38.4 09/17/2012   MCV 81.0 09/17/2012   PLT 144 09/17/2012     Chemistry      Component Value Date/Time   NA 141 09/17/2012 1030   NA 137 02/13/2012 1236   K 4.3 09/17/2012 1030   K 4.1 02/13/2012 1236   CL 105 09/17/2012 1030   CL 102 02/13/2012 1236   CO2 28 09/17/2012 1030   CO2 26 02/13/2012 1236   BUN 19.0 09/17/2012 1030   BUN 18 02/13/2012 1236   CREATININE 1.3 09/17/2012 1030   CREATININE 1.08 02/13/2012 1236      Component Value Date/Time   CALCIUM 9.7 09/17/2012 1030   CALCIUM 9.5 02/13/2012 1236   ALKPHOS 114 09/17/2012 1030   ALKPHOS 105 02/13/2012 1236   AST 24 09/17/2012 1030   AST 21 02/13/2012 1236   ALT 28 09/17/2012 1030   ALT 24 02/13/2012 1236   BILITOT 0.44 09/17/2012 1030   BILITOT 0.3 02/13/2012 1236      .pathology. Radiological Studies: chest X-ray n/a Mammogram n/a Bone density n/a  Impression and Plan: 51 yo long term hx of thrombocythemia , on anagrelide. Will f/u 6 months.. Likely plan for interim bone marrow   More than 50% of the visit was spent in patient-related counselling   Pierce Crane, MD 11/17/201310:05 PM

## 2012-09-24 ENCOUNTER — Ambulatory Visit (INDEPENDENT_AMBULATORY_CARE_PROVIDER_SITE_OTHER): Payer: 59 | Admitting: Family Medicine

## 2012-09-24 ENCOUNTER — Encounter: Payer: Self-pay | Admitting: Family Medicine

## 2012-09-24 VITALS — BP 158/60 | HR 93 | Temp 98.0°F | Resp 16 | Ht 66.75 in | Wt 186.6 lb

## 2012-09-24 DIAGNOSIS — D473 Essential (hemorrhagic) thrombocythemia: Secondary | ICD-10-CM

## 2012-09-24 DIAGNOSIS — J309 Allergic rhinitis, unspecified: Secondary | ICD-10-CM

## 2012-09-24 DIAGNOSIS — Z760 Encounter for issue of repeat prescription: Secondary | ICD-10-CM

## 2012-09-24 DIAGNOSIS — I10 Essential (primary) hypertension: Secondary | ICD-10-CM

## 2012-09-24 MED ORDER — FLUTICASONE PROPIONATE 50 MCG/ACT NA SUSP: 2.0000 | Freq: Every day | NASAL | Status: DC

## 2012-09-24 MED ORDER — ONETOUCH ULTRASOFT LANCETS MISC: Status: DC

## 2012-09-24 MED ORDER — DILTIAZEM HCL ER COATED BEADS 420 MG PO TB24: 420.0000 mg | ORAL_TABLET | Freq: Every day | ORAL | Status: DC

## 2012-09-24 MED ORDER — LEVOTHYROXINE SODIUM 150 MCG PO TABS: 150.0000 ug | ORAL_TABLET | Freq: Every day | ORAL | Status: DC

## 2012-09-24 MED ORDER — GLUCOSE BLOOD VI STRP: ORAL_STRIP | Status: DC

## 2012-09-24 MED ORDER — HYDROCHLOROTHIAZIDE 12.5 MG PO CAPS: 12.5000 mg | ORAL_CAPSULE | Freq: Every day | ORAL | Status: DC

## 2012-09-24 NOTE — Progress Notes (Signed)
S: Thsi 51 y.o. Cauc male has HTN and Hypothyroidism and is here for medication refills. He was told in the past he is "borderline Diabetic" so he has changed his nutrition and checks FSBS daily (average= 120). He tries to exercise most days of the week but notes weight gain since he quit smoking ~ 4 years ago. He is followed at [facility] by Dr. Donnie Coffin for thrombocytopenia. Current medications are effective without side effects.   He c/o sinus congestion and slightly prod cough, onset this AM. No fever /chills. Flonase NS has been effective in past (requests refills). He says his wife complains about his snoring.  ROS: As per HPI.  O:  Filed Vitals:   09/24/12 1410  BP: 158/60  Pulse: 93  Temp: 98 F (36.7 C)  Resp: 16   GEN: In NAD: WN,WD. Well groomed. HEENT: [place]/AT: EOMI w/ injected conj and clear sclerae. Post ph w/ erythema, no exudate. NECK: No LAN. LUNGS: CTA. COR: RRR. NEURO: A&O x 3; Cns intact. Nonfocal.  A/P:  1. HTN, goal below 130/80  Continue current medications Encourage weight reduction.  2. Medication refill    3. Allergic rhinitis  RX: Fluticasone NS  4. Thrombocythemia  Follow-up w/ Dr. Donnie Coffin.   Gave pt RX requesting additional labs at next blood draw to include: Lipids and TSH, Free T4.  Flu vaccine given at work in Sept 2013.

## 2012-09-24 NOTE — Patient Instructions (Addendum)
Please ask that Lipids and TSH, Free T4 be added to the next set of labs that you have done. See you in 6 months for physical exam.  HAPPY BIRTHDAY !!!

## 2012-10-14 ENCOUNTER — Other Ambulatory Visit: Payer: Self-pay | Admitting: Emergency Medicine

## 2012-11-19 ENCOUNTER — Telehealth: Payer: Self-pay | Admitting: Oncology

## 2012-11-19 ENCOUNTER — Encounter: Payer: Self-pay | Admitting: Oncology

## 2012-11-19 ENCOUNTER — Other Ambulatory Visit (HOSPITAL_BASED_OUTPATIENT_CLINIC_OR_DEPARTMENT_OTHER): Payer: 59 | Admitting: Lab

## 2012-11-19 DIAGNOSIS — D473 Essential (hemorrhagic) thrombocythemia: Secondary | ICD-10-CM

## 2012-11-19 LAB — CBC WITH DIFFERENTIAL/PLATELET
Eosinophils Absolute: 0.1 10*3/uL (ref 0.0–0.5)
HCT: 33.7 % — ABNORMAL LOW (ref 38.4–49.9)
LYMPH%: 25.6 % (ref 14.0–49.0)
MCHC: 33.6 g/dL (ref 32.0–36.0)
MONO#: 0.2 10*3/uL (ref 0.1–0.9)
NEUT#: 2.7 10*3/uL (ref 1.5–6.5)
NEUT%: 65.8 % (ref 39.0–75.0)
Platelets: 153 10*3/uL (ref 140–400)
WBC: 4.1 10*3/uL (ref 4.0–10.3)

## 2012-11-19 LAB — MORPHOLOGY

## 2012-11-19 NOTE — Telephone Encounter (Signed)
Pt returned call and was given a new appt time for 3:30 pm today. Also confirmed appts for 3/14, 5/9 and reassigned to DM.

## 2012-11-19 NOTE — Telephone Encounter (Signed)
Returned pt's call re r/s 1/17 lb appt. Former pt of PR. lmonvm for pt asking that he call me back ASAP re when he can come in for lb and also re his 5/9 lb/PR. Pt made aware that I need to s/w re changes to 5/9 appt ASAP. email to Tami.

## 2012-11-19 NOTE — Telephone Encounter (Signed)
Reassigned letter mailed w/march/may schedule. See previous note.

## 2013-01-06 ENCOUNTER — Other Ambulatory Visit: Payer: Self-pay | Admitting: Oncology

## 2013-01-14 ENCOUNTER — Other Ambulatory Visit: Payer: 59

## 2013-01-19 ENCOUNTER — Other Ambulatory Visit: Payer: Self-pay | Admitting: Oncology

## 2013-01-19 DIAGNOSIS — D473 Essential (hemorrhagic) thrombocythemia: Secondary | ICD-10-CM

## 2013-03-10 ENCOUNTER — Telehealth: Payer: Self-pay | Admitting: Oncology

## 2013-03-10 NOTE — Telephone Encounter (Signed)
Returned pt's call re moving May appt to early July due to new job and ins issues. lmonvm for pt re new appt for 7/7 and mailed schedule.

## 2013-03-11 ENCOUNTER — Ambulatory Visit: Payer: 59 | Admitting: Oncology

## 2013-03-11 ENCOUNTER — Other Ambulatory Visit: Payer: 59 | Admitting: Lab

## 2013-03-22 ENCOUNTER — Other Ambulatory Visit: Payer: Self-pay | Admitting: *Deleted

## 2013-03-22 DIAGNOSIS — D473 Essential (hemorrhagic) thrombocythemia: Secondary | ICD-10-CM

## 2013-03-22 MED ORDER — ALPRAZOLAM 0.5 MG PO TABS: ORAL_TABLET | ORAL | Status: DC

## 2013-04-08 ENCOUNTER — Telehealth: Payer: Self-pay | Admitting: Oncology

## 2013-04-08 NOTE — Telephone Encounter (Signed)
Received message from Vanessa Barbara re calling pt to confirm next appt for 7/7. Called pt, lm and mailed schedule. Also pt called back and confirmed he did receive my call.

## 2013-04-14 ENCOUNTER — Telehealth: Payer: Self-pay | Admitting: Oncology

## 2013-04-14 NOTE — Telephone Encounter (Signed)
Moved 7/7 appt to covering provider due to DM's departure. Time for lb/fu changed to 10am. lmonvm for pt re new time. Former PR pt.

## 2013-05-04 ENCOUNTER — Other Ambulatory Visit: Payer: Self-pay | Admitting: Oncology

## 2013-05-05 ENCOUNTER — Telehealth: Payer: Self-pay | Admitting: Oncology

## 2013-05-05 NOTE — Telephone Encounter (Signed)
S/w pt today re moving 7/7 appt to 7/10 due to CP delay. Pt returned call and due to new job pt needs appts to remain 7/7 he cannot change his day off again and since he is new he cannot take another day off for 90 days. After speaking w/Skip, Mardella Layman and Dr. Welton Flakes. Pt will bee seen 7/7 @ 10:45am lb/LA/KK. lmonvm for pt re above. Pt then returned my call confirming he will be here for 7/7 appt.

## 2013-05-05 NOTE — Telephone Encounter (Signed)
Moved 7/7 appt to 7/10 due to CP delay. lmonvm for pt re new appt for 7/10 @ 2:30pm lb/CP. Schedule mailed.

## 2013-05-09 ENCOUNTER — Other Ambulatory Visit (HOSPITAL_BASED_OUTPATIENT_CLINIC_OR_DEPARTMENT_OTHER): Payer: PRIVATE HEALTH INSURANCE | Admitting: Lab

## 2013-05-09 ENCOUNTER — Other Ambulatory Visit: Payer: 59

## 2013-05-09 ENCOUNTER — Ambulatory Visit (HOSPITAL_BASED_OUTPATIENT_CLINIC_OR_DEPARTMENT_OTHER): Payer: PRIVATE HEALTH INSURANCE | Admitting: Adult Health

## 2013-05-09 ENCOUNTER — Encounter: Payer: Self-pay | Admitting: Adult Health

## 2013-05-09 ENCOUNTER — Telehealth: Payer: Self-pay | Admitting: Oncology

## 2013-05-09 ENCOUNTER — Ambulatory Visit: Payer: 59

## 2013-05-09 VITALS — BP 137/80 | HR 89 | Temp 98.2°F | Resp 20 | Ht 66.75 in | Wt 186.9 lb

## 2013-05-09 DIAGNOSIS — D473 Essential (hemorrhagic) thrombocythemia: Secondary | ICD-10-CM

## 2013-05-09 DIAGNOSIS — D696 Thrombocytopenia, unspecified: Secondary | ICD-10-CM

## 2013-05-09 LAB — COMPREHENSIVE METABOLIC PANEL (CC13)
AST: 19 U/L (ref 5–34)
Albumin: 3.9 g/dL (ref 3.5–5.0)
BUN: 13.9 mg/dL (ref 7.0–26.0)
Calcium: 9.6 mg/dL (ref 8.4–10.4)
Chloride: 100 mEq/L (ref 98–109)
Glucose: 181 mg/dl — ABNORMAL HIGH (ref 70–140)
Potassium: 4 mEq/L (ref 3.5–5.1)

## 2013-05-09 LAB — CBC WITH DIFFERENTIAL/PLATELET
BASO%: 0.2 % (ref 0.0–2.0)
LYMPH%: 26.2 % (ref 14.0–49.0)
MCHC: 33.7 g/dL (ref 32.0–36.0)
MONO#: 0.3 10*3/uL (ref 0.1–0.9)
RBC: 4.68 10*6/uL (ref 4.20–5.82)
WBC: 4.3 10*3/uL (ref 4.0–10.3)
lymph#: 1.1 10*3/uL (ref 0.9–3.3)

## 2013-05-09 LAB — LACTATE DEHYDROGENASE (CC13): LDH: 201 U/L (ref 125–245)

## 2013-05-09 MED ORDER — ALPRAZOLAM 0.5 MG PO TABS: ORAL_TABLET | ORAL | Status: DC

## 2013-05-09 MED ORDER — ANAGRELIDE HCL 1 MG PO CAPS: 1.0000 mg | ORAL_CAPSULE | Freq: Three times a day (TID) | ORAL | Status: DC

## 2013-05-09 NOTE — Progress Notes (Signed)
OFFICE PROGRESS NOTE  CC**  No PCP Per Patient [address] [place] [address]  DIAGNOSIS: 52 year old male with thrombocythemia.  PRIOR THERAPY:  1. Patient was diagnosed with thrombocythemia in 36.  He was on Hydrea and lost to follow up and had been non-compliant.  In June 2004 he came in to see Dr. Donnie Coffin and his plt count was 1380.  He was started on Hydrea, however it was difficulty to manage his dose due to Leukopenia.  He had a bone marrow biopsy on 05/24/03 which only noted megakaryocytosis.   2. He was started on Anagrelide in 2005 and has been tolerating it well and is currently on 3mg  daily. His platelets have been in the 200s for the past year.    3.  He has h/o iron deficiency, and has been on oral iron in the past. His last iron studies were in 2010.    CURRENT THERAPY: Anagrelide 3mg  daily  INTERVAL HISTORY: Charles Bowman 52 y.o. male returns for evaluation for his thrombocythemia.  He is feeling well today.  He continues on Anagrelide 3mg  daily and is tolerating it well.  His platelets remain stable.  He denies fevers, chills, nausea, vomiting, constipation, diarrhea, headaches, vision changes, weakness, numbness, swelling, shortness of breath, cough, DOE, PND, or any other concerns.  A 10 point ROS is neg.   MEDICAL HISTORY: Past Medical History  Diagnosis Date  . Anemia   . Diabetes mellitus without complication   . Hypertension   . Thyroid disease   . Cancer     ALLERGIES:  has No Known Allergies.  MEDICATIONS:  Current Outpatient Prescriptions  Medication Sig Dispense Refill  . ALPRAZolam (XANAX) 0.5 MG tablet TAKE 1 TABLET BY MOUTH TWICE A DAY AS NEEDED FOR ANXIETY. ALSO MAY USE FOR SLEEP.  40 tablet  0  . anagrelide (AGRYLIN) 1 MG capsule Take 1 capsule (1 mg total) by mouth 3 (three) times daily.  90 capsule  6  . diltiazem (MATZIM LA) 420 MG 24 hr tablet Take 420 mg by mouth daily.      . fluticasone (FLONASE) 50 MCG/ACT nasal spray Place 2  sprays into the nose daily.  16 g  6  . glucose blood (ONE TOUCH ULTRA TEST) test strip Use as instructed  100 each  2  . hydrochlorothiazide (MICROZIDE) 12.5 MG capsule Take 1 capsule (12.5 mg total) by mouth daily.  30 capsule  11  . Lancets (ONETOUCH ULTRASOFT) lancets Use as instructed  100 each  0  . levothyroxine (SYNTHROID, LEVOTHROID) 150 MCG tablet Take 1 tablet (150 mcg total) by mouth daily. Needs office visit and labs for further refills.  30 tablet  11  . Multiple Vitamins-Minerals (MULTIVITAMIN WITH MINERALS) tablet Take 1 tablet by mouth daily.       No current facility-administered medications for this visit.    SURGICAL HISTORY: History reviewed. No pertinent past surgical history.  REVIEW OF SYSTEMS: General: fatigue (-), night sweats (-), fever (-), pain (-) Lymph: palpable nodes (-) HEENT: vision changes (-), mucositis (-), gum bleeding (-), epistaxis (-) Cardiovascular: chest pain (-), palpitations (-) Pulmonary: shortness of breath (-), dyspnea on exertion (-), cough (-), hemoptysis (-) GI:  Early satiety (-), melena (-), dysphagia (-), nausea/vomiting (-), diarrhea (-) GU: dysuria (-), hematuria (-), incontinence (-) Musculoskeletal: joint swelling (-), joint pain (-), back pain (-) Neuro: weakness (-), numbness (-), headache (-), confusion (-) Skin: Rash (-), lesions (-), dryness (-) Psych:  depression (-), suicidal/homicidal ideation (-), feeling of hopelessness (-)    PHYSICAL EXAMINATION: Blood pressure 137/80, pulse 89, temperature 98.2 F (36.8 C), temperature source Oral, resp. rate 20, height 5' 6.75" (1.695 m), weight 186 lb 14.4 oz (84.777 kg). Body mass index is 29.51 kg/(m^2). General: Patient is a well appearing male in no acute distress HEENT: PERRLA, sclerae anicteric no conjunctival pallor, MMM Neck: supple, no palpable adenopathy Lungs: clear to auscultation bilaterally, no wheezes, rhonchi, or rales Cardiovascular: regular rate rhythm, S1, S2,  no murmurs, rubs or gallops Abdomen: Soft, non-tender, non-distended, normoactive bowel sounds, no HSM Extremities: warm and well perfused, no clubbing, cyanosis, or edema Skin: No rashes or lesions Neuro: Non-focal ECOG PERFORMANCE STATUS: 0 - Asymptomatic    LABORATORY DATA: Lab Results  Component Value Date   WBC 4.3 05/09/2013   HGB 12.5* 05/09/2013   HCT 37.1* 05/09/2013   MCV 79.3 05/09/2013   PLT 206 05/09/2013      Chemistry      Component Value Date/Time   NA 138 05/09/2013 1022   NA 137 02/13/2012 1236   K 4.0 05/09/2013 1022   K 4.1 02/13/2012 1236   CL 105 09/17/2012 1030   CL 102 02/13/2012 1236   CO2 30* 05/09/2013 1022   CO2 26 02/13/2012 1236   BUN 13.9 05/09/2013 1022   BUN 18 02/13/2012 1236   CREATININE 1.0 05/09/2013 1022   CREATININE 1.08 02/13/2012 1236      Component Value Date/Time   CALCIUM 9.6 05/09/2013 1022   CALCIUM 9.5 02/13/2012 1236   ALKPHOS 108 05/09/2013 1022   ALKPHOS 105 02/13/2012 1236   AST 19 05/09/2013 1022   AST 21 02/13/2012 1236   ALT 26 05/09/2013 1022   ALT 24 02/13/2012 1236   BILITOT 0.39 05/09/2013 1022   BILITOT 0.3 02/13/2012 1236       RADIOGRAPHIC STUDIES:  No results found.  ASSESSMENT:  Patient is a 52 year old male with   1. thrombocythemia since 1986, on Anagrelide 3mg /day since 2005 doing well.  He does have long standing h/o hypertension and is managed with HCTZ and dilt.  His platelets have been holding steady for the past 1-2 years.    2. Iron deficiency: patient has previously required oral iron supplements, we will follow up on his iron studies at his next lab appt.    PLAN:   1. Doing well.  Platelets remain stable.  Continue current dose of Anagrelide.  2. We will re-check a CBC, CMET, and iron studies in 3 months, f/u in 6 months.     All questions were answered. The patient knows to call the clinic with any problems, questions or concerns. We can certainly see the patient much sooner if necessary.  I spent 40 minutes  counseling the patient face to face. The total time spent in the appointment was 60 minutes.  Cherie Ouch Lyn Hollingshead, NP Medical Oncology [facility] Phone: [phone] 05/09/2013, 1:19 PM

## 2013-05-09 NOTE — Patient Instructions (Signed)
Doing well.  Labs are stable.  Continue Anagrelide 3mg  daily.  We will recheck labs in 3 months and f/u in 6 months.  Please call us if you have any questions or concerns.

## 2013-05-12 ENCOUNTER — Ambulatory Visit: Payer: 59

## 2013-05-12 ENCOUNTER — Other Ambulatory Visit: Payer: 59 | Admitting: Lab

## 2013-05-12 NOTE — Progress Notes (Signed)
ATTENDING'S ATTESTATION:  I personally reviewed patient's chart, examined patient myself, formulated the treatment plan as followed.    Charles Bowman is a very pleasant 52 year old gentleman with history of thrombocythemia (essential thrombocytosis). He has been seen in the past by Dr. Pierce Crane. He was initially diagnosed in June 2004. At that time his platelet count was over 1 million. He was started on Hydrea but he had difficulty with his doses do to development of leukopenia. She therefore has been on anagrelide since 2005 overall he's been tolerating it well without any significant complaints.  The patient and I discussed side effects risks benefits of anagrelide. He is very well aware of the cardiac risks. He will continue the anagrelide 3 mg daily. We will continue to follow him. He should also has a history of iron deficiency he's been on oral iron in the past.   We will plan on seeing him back in 6 months time or sooner if need arises.   [name], MD Medical/Oncology [facility] [phone] (beeper) [phone] (Office)

## 2013-06-24 ENCOUNTER — Other Ambulatory Visit: Payer: Self-pay | Admitting: Adult Health

## 2013-07-01 ENCOUNTER — Other Ambulatory Visit: Payer: Self-pay | Admitting: Emergency Medicine

## 2013-07-01 DIAGNOSIS — D473 Essential (hemorrhagic) thrombocythemia: Secondary | ICD-10-CM

## 2013-07-01 MED ORDER — ALPRAZOLAM 0.5 MG PO TABS: 0.5000 mg | ORAL_TABLET | Freq: Every evening | ORAL | Status: DC | PRN

## 2013-08-11 ENCOUNTER — Telehealth: Payer: Self-pay | Admitting: Oncology

## 2013-08-11 NOTE — Telephone Encounter (Signed)
, °

## 2013-08-12 ENCOUNTER — Other Ambulatory Visit: Payer: PRIVATE HEALTH INSURANCE | Admitting: Lab

## 2013-09-30 ENCOUNTER — Other Ambulatory Visit (HOSPITAL_BASED_OUTPATIENT_CLINIC_OR_DEPARTMENT_OTHER): Payer: PRIVATE HEALTH INSURANCE | Admitting: Lab

## 2013-09-30 DIAGNOSIS — D473 Essential (hemorrhagic) thrombocythemia: Secondary | ICD-10-CM

## 2013-09-30 LAB — COMPREHENSIVE METABOLIC PANEL (CC13)
ALT: 30 U/L (ref 0–55)
Alkaline Phosphatase: 114 U/L (ref 40–150)
Sodium: 136 mEq/L (ref 136–145)
Total Bilirubin: 0.37 mg/dL (ref 0.20–1.20)
Total Protein: 7.4 g/dL (ref 6.4–8.3)

## 2013-09-30 LAB — CBC WITH DIFFERENTIAL/PLATELET
BASO%: 0.2 % (ref 0.0–2.0)
LYMPH%: 27.5 % (ref 14.0–49.0)
MCHC: 32.9 g/dL (ref 32.0–36.0)
MCV: 81.2 fL (ref 79.3–98.0)
MONO%: 6.7 % (ref 0.0–14.0)
Platelets: 173 10*3/uL (ref 140–400)
RBC: 4.83 10*6/uL (ref 4.20–5.82)

## 2013-09-30 LAB — FERRITIN CHCC: Ferritin: 42 ng/ml (ref 22–316)

## 2013-09-30 LAB — IRON AND TIBC CHCC
%SAT: 30 % (ref 20–55)
UIBC: 242 ug/dL (ref 117–376)

## 2013-10-07 ENCOUNTER — Other Ambulatory Visit: Payer: Self-pay | Admitting: Family Medicine

## 2013-11-06 ENCOUNTER — Other Ambulatory Visit: Payer: Self-pay | Admitting: Physician Assistant

## 2013-11-11 ENCOUNTER — Encounter: Payer: Self-pay | Admitting: Adult Health

## 2013-11-11 ENCOUNTER — Other Ambulatory Visit: Payer: Self-pay | Admitting: Oncology

## 2013-11-11 ENCOUNTER — Ambulatory Visit (HOSPITAL_BASED_OUTPATIENT_CLINIC_OR_DEPARTMENT_OTHER): Payer: PRIVATE HEALTH INSURANCE | Admitting: Adult Health

## 2013-11-11 ENCOUNTER — Telehealth: Payer: Self-pay | Admitting: Oncology

## 2013-11-11 ENCOUNTER — Other Ambulatory Visit (HOSPITAL_BASED_OUTPATIENT_CLINIC_OR_DEPARTMENT_OTHER): Payer: PRIVATE HEALTH INSURANCE

## 2013-11-11 VITALS — BP 152/73 | HR 74 | Temp 98.5°F | Resp 18 | Wt 189.2 lb

## 2013-11-11 DIAGNOSIS — D509 Iron deficiency anemia, unspecified: Secondary | ICD-10-CM

## 2013-11-11 DIAGNOSIS — D473 Essential (hemorrhagic) thrombocythemia: Secondary | ICD-10-CM

## 2013-11-11 LAB — CBC WITH DIFFERENTIAL/PLATELET
BASO%: 0.2 % (ref 0.0–2.0)
Basophils Absolute: 0 10*3/uL (ref 0.0–0.1)
EOS%: 0.8 % (ref 0.0–7.0)
Eosinophils Absolute: 0 10*3/uL (ref 0.0–0.5)
HCT: 37.9 % — ABNORMAL LOW (ref 38.4–49.9)
HGB: 12.6 g/dL — ABNORMAL LOW (ref 13.0–17.1)
LYMPH%: 21.3 % (ref 14.0–49.0)
MCH: 27.1 pg — AB (ref 27.2–33.4)
MCHC: 33.3 g/dL (ref 32.0–36.0)
MCV: 81.5 fL (ref 79.3–98.0)
MONO#: 0.3 10*3/uL (ref 0.1–0.9)
MONO%: 6.4 % (ref 0.0–14.0)
NEUT#: 3.2 10*3/uL (ref 1.5–6.5)
NEUT%: 71.3 % (ref 39.0–75.0)
PLATELETS: 242 10*3/uL (ref 140–400)
RBC: 4.65 10*6/uL (ref 4.20–5.82)
RDW: 17.7 % — ABNORMAL HIGH (ref 11.0–14.6)
WBC: 4.5 10*3/uL (ref 4.0–10.3)
lymph#: 1 10*3/uL (ref 0.9–3.3)

## 2013-11-11 LAB — COMPREHENSIVE METABOLIC PANEL (CC13)
ALT: 25 U/L (ref 0–55)
ANION GAP: 11 meq/L (ref 3–11)
AST: 18 U/L (ref 5–34)
Albumin: 4.1 g/dL (ref 3.5–5.0)
Alkaline Phosphatase: 104 U/L (ref 40–150)
BILIRUBIN TOTAL: 0.34 mg/dL (ref 0.20–1.20)
BUN: 20.6 mg/dL (ref 7.0–26.0)
CO2: 26 meq/L (ref 22–29)
CREATININE: 1.2 mg/dL (ref 0.7–1.3)
Calcium: 9.4 mg/dL (ref 8.4–10.4)
Chloride: 102 mEq/L (ref 98–109)
Glucose: 163 mg/dl — ABNORMAL HIGH (ref 70–140)
Potassium: 4.1 mEq/L (ref 3.5–5.1)
Sodium: 139 mEq/L (ref 136–145)
Total Protein: 7.3 g/dL (ref 6.4–8.3)

## 2013-11-11 MED ORDER — ALPRAZOLAM 0.5 MG PO TABS: 0.5000 mg | ORAL_TABLET | Freq: Every evening | ORAL | Status: DC | PRN

## 2013-11-11 NOTE — Telephone Encounter (Signed)
, °

## 2013-11-11 NOTE — Progress Notes (Addendum)
OFFICE PROGRESS NOTE  CC**  No PCP Per Patient Charles Bowman [id number]  DIAGNOSIS: 53 year old male with thrombocythemia.  PRIOR THERAPY:  1. Patient was diagnosed with thrombocythemia in 41.  He was on Hydrea and lost to follow up and had been non-compliant.  In June 2004 he came in to see Dr. Truddie Coco and his plt count was 1380.  He was started on Hydrea, however it was difficulty to manage his dose due to Leukopenia.  He had a bone marrow biopsy on 05/24/03 which only noted megakaryocytosis.   2. He was started on Anagrelide in 2005 and has been tolerating it well and is currently on 18m daily. His platelets have been in the 200s for the past year.    3.  He has h/o iron deficiency, and has been on oral iron in the past. His last iron studies were in 2014.    CURRENT THERAPY: Anagrelide 361mdaily  INTERVAL HISTORY: [name]54.o. male returns for evaluation for his thrombocythemia.  He is feeling well today.  He continues on Anagrelide 49m79maily and is tolerating it well. He takes 2mg34m the morning and 1mg 18mthe evening.  His BP is elevated today and he is following up with his primary care physician, Dr. PamonVerl Dicker week about this.  He denies fevers, chills, headaches, weakness, numbness, vision changes, chest pain, or any other concerns.    MEDICAL HISTORY: Past Medical History  Diagnosis Date  . Anemia   . Diabetes mellitus without complication   . Hypertension   . Thyroid disease   . Cancer     ALLERGIES:  has No Known Allergies.  MEDICATIONS:  Current Outpatient Prescriptions  Medication Sig Dispense Refill  . ALPRAZolam (XANAX) 0.5 MG tablet Take 1 tablet (0.5 mg total) by mouth at bedtime as needed for sleep.  40 tablet  2  . anagrelide (AGRYLIN) 1 MG capsule Take 1 capsule (1 mg total) by mouth 3 (three) times daily.  90 capsule  6  . diltiazem (MATZIM LA) 420 MG 24 hr tablet Take 420 mg by mouth daily.      . gluMarland Kitchenose blood (ONE TOUCH ULTRA  TEST) test strip Use as instructed  100 each  2  . hydrochlorothiazide (MICROZIDE) 12.5 MG capsule TAKE ONE CAPSULE BY MOUTH EVERY DAY  30 capsule  0  . Lancets (ONETOUCH ULTRASOFT) lancets Use as instructed  100 each  0  . levothyroxine (SYNTHROID, LEVOTHROID) 150 MCG tablet TAKE 1 TABLET BY MOUTH EVERY DAY **NEEDS OV AND LABS**  30 tablet  0  . Multiple Vitamins-Minerals (MULTIVITAMIN WITH MINERALS) tablet Take 1 tablet by mouth daily.      . fluticasone (FLONASE) 50 MCG/ACT nasal spray Place 2 sprays into the nose daily.  16 g  6   No current facility-administered medications for this visit.    SURGICAL HISTORY: No past surgical history on file.  REVIEW OF SYSTEMS: A 10 point review of systems was conducted and is otherwise negative except for what is noted above.     PHYSICAL EXAMINATION: Blood pressure 152/73, pulse 74, temperature 98.5 F (36.9 C), temperature source Oral, resp. rate 18, weight 189 lb 2.5 oz (85.8 kg). Body mass index is 29.86 kg/(m^2). GENERAL: Patient is a well appearing male in no acute distress HEENT:  Sclerae anicteric.  Oropharynx clear and moist. No ulcerations or evidence of oropharyngeal candidiasis. Neck is supple.  NODES:  No cervical, supraclavicular, or  axillary lymphadenopathy palpated.  LUNGS:  Clear to auscultation bilaterally.  No wheezes or rhonchi. HEART:  Regular rate and rhythm. No murmur appreciated. ABDOMEN:  Soft, nontender.  Positive, normoactive bowel sounds. No organomegaly palpated. MSK:  No focal spinal tenderness to palpation. Full range of motion bilaterally in the upper extremities. EXTREMITIES:  No peripheral edema.   SKIN:  Clear with no obvious rashes or skin changes. No nail dyscrasia. NEURO:  Nonfocal. Well oriented.  Appropriate affect. ECOG PERFORMANCE STATUS: 0 - Asymptomatic    LABORATORY DATA: Lab Results  Component Value Date   WBC 4.5 11/11/2013   HGB 12.6* 11/11/2013   HCT 37.9* 11/11/2013   MCV 81.5 11/11/2013   PLT  242 11/11/2013      Chemistry      Component Value Date/Time   NA 139 11/11/2013 1456   NA 137 02/13/2012 1236   K 4.1 11/11/2013 1456   K 4.1 02/13/2012 1236   CL 105 09/17/2012 1030   CL 102 02/13/2012 1236   CO2 26 11/11/2013 1456   CO2 26 02/13/2012 1236   BUN 20.6 11/11/2013 1456   BUN 18 02/13/2012 1236   CREATININE 1.2 11/11/2013 1456   CREATININE 1.08 02/13/2012 1236      Component Value Date/Time   CALCIUM 9.4 11/11/2013 1456   CALCIUM 9.5 02/13/2012 1236   ALKPHOS 104 11/11/2013 1456   ALKPHOS 105 02/13/2012 1236   AST 18 11/11/2013 1456   AST 21 02/13/2012 1236   ALT 25 11/11/2013 1456   ALT 24 02/13/2012 1236   BILITOT 0.34 11/11/2013 1456   BILITOT 0.3 02/13/2012 1236       RADIOGRAPHIC STUDIES:  No results found.  ASSESSMENT:  Patient is a 53 year old male with   1. thrombocythemia since 1986, on Anagrelide 62m/day since 2005 doing well.  He does have long standing h/o hypertension and is managed with HCTZ and dilt.  His platelets have been holding steady for the past 2-3 years. He is f/u with Dr. PVerl Dickernext week regarding his hypertension.    2. Iron deficiency: patient has previously required oral iron supplements, we will follow up on his iron studies at his next appointment.     PLAN:   1. Mr. DSheardcontinues to tolerate the Anagrelide 323mdaily very well.  He will continue taking this.  I encouraged him to f/u with his PCP as scheduled regarding his hypertension for better control.  He plans on doing this next week.    2. We will see [name] in 6 months for evaluation, at that time we will get labs including a CBC, CMET, and iron studies.    All questions were answered. The patient knows to call the clinic with any problems, questions or concerns. We can certainly see the patient much sooner if necessary.  I spent 15 minutes counseling the patient face to face. The total time spent in the appointment was 30 minutes.  LiMinette [phone]/10/2014, 8:52 AM  ATTENDING'S ATTESTATION:  I personally reviewed patient's chart, examined patient myself, formulated the treatment plan as followed.     clincally stable. Continue hydrea for now. Follow up in 6 months.  KaMarcy PanningMD Medical/Oncology [phone]) [phone])  11/26/2013, 2:48 PM

## 2013-11-14 ENCOUNTER — Telehealth: Payer: Self-pay

## 2013-11-14 DIAGNOSIS — D75839 Thrombocytosis, unspecified: Secondary | ICD-10-CM

## 2013-11-14 DIAGNOSIS — D473 Essential (hemorrhagic) thrombocythemia: Secondary | ICD-10-CM

## 2013-11-14 NOTE — Telephone Encounter (Signed)
Please advise if able to send in one month. Pt has not been seen since 2013.

## 2013-11-14 NOTE — Telephone Encounter (Signed)
Pt called and states he needs a refill on BP meds and thyroid meds, he will be out of medication  on Wednesday. pts states he will not have any money for an ov (and past due balance) and is requesting a 1 month refill until he can secure funds for an ov

## 2013-11-15 MED ORDER — ONETOUCH ULTRASOFT LANCETS MISC: Status: DC

## 2013-11-15 MED ORDER — HYDROCHLOROTHIAZIDE 12.5 MG PO CAPS: ORAL_CAPSULE | ORAL | Status: DC

## 2013-11-15 MED ORDER — DILTIAZEM HCL ER COATED BEADS 420 MG PO TB24: 420.0000 mg | ORAL_TABLET | Freq: Every day | ORAL | Status: DC

## 2013-11-15 MED ORDER — LEVOTHYROXINE SODIUM 150 MCG PO TABS: ORAL_TABLET | ORAL | Status: DC

## 2013-11-15 MED ORDER — GLUCOSE BLOOD VI STRP: ORAL_STRIP | Status: AC

## 2013-11-15 NOTE — Telephone Encounter (Signed)
Advised pt refills were sent it. He confirms he will be coming in before they run out.

## 2013-11-15 NOTE — Telephone Encounter (Signed)
One month of refills is fine; he must be seen next month.

## 2013-12-01 ENCOUNTER — Ambulatory Visit (INDEPENDENT_AMBULATORY_CARE_PROVIDER_SITE_OTHER): Payer: PRIVATE HEALTH INSURANCE | Admitting: Family Medicine

## 2013-12-01 VITALS — BP 120/72 | HR 126 | Temp 99.3°F | Resp 18 | Ht 66.75 in | Wt 184.0 lb

## 2013-12-01 DIAGNOSIS — I10 Essential (primary) hypertension: Secondary | ICD-10-CM

## 2013-12-01 DIAGNOSIS — E119 Type 2 diabetes mellitus without complications: Secondary | ICD-10-CM

## 2013-12-01 DIAGNOSIS — R6889 Other general symptoms and signs: Secondary | ICD-10-CM

## 2013-12-01 DIAGNOSIS — J09X2 Influenza due to identified novel influenza A virus with other respiratory manifestations: Secondary | ICD-10-CM

## 2013-12-01 DIAGNOSIS — J309 Allergic rhinitis, unspecified: Secondary | ICD-10-CM

## 2013-12-01 DIAGNOSIS — R739 Hyperglycemia, unspecified: Secondary | ICD-10-CM

## 2013-12-01 DIAGNOSIS — R7309 Other abnormal glucose: Secondary | ICD-10-CM

## 2013-12-01 DIAGNOSIS — E039 Hypothyroidism, unspecified: Secondary | ICD-10-CM

## 2013-12-01 LAB — COMPREHENSIVE METABOLIC PANEL
ALK PHOS: 98 U/L (ref 39–117)
ALT: 22 U/L (ref 0–53)
AST: 18 U/L (ref 0–37)
Albumin: 4.5 g/dL (ref 3.5–5.2)
BUN: 16 mg/dL (ref 6–23)
CO2: 24 mEq/L (ref 19–32)
Calcium: 9.2 mg/dL (ref 8.4–10.5)
Chloride: 96 mEq/L (ref 96–112)
Creat: 1.01 mg/dL (ref 0.50–1.35)
Glucose, Bld: 124 mg/dL — ABNORMAL HIGH (ref 70–99)
POTASSIUM: 4.1 meq/L (ref 3.5–5.3)
Sodium: 131 mEq/L — ABNORMAL LOW (ref 135–145)
Total Bilirubin: 0.6 mg/dL (ref 0.2–1.2)
Total Protein: 7 g/dL (ref 6.0–8.3)

## 2013-12-01 LAB — POCT INFLUENZA A/B
INFLUENZA B, POC: NEGATIVE
Influenza A, POC: POSITIVE

## 2013-12-01 LAB — POCT GLYCOSYLATED HEMOGLOBIN (HGB A1C): Hemoglobin A1C: 6.6

## 2013-12-01 LAB — TSH: TSH: 5.383 u[IU]/mL — ABNORMAL HIGH (ref 0.350–4.500)

## 2013-12-01 LAB — GLUCOSE, POCT (MANUAL RESULT ENTRY): POC Glucose: 118 mg/dl — AB (ref 70–99)

## 2013-12-01 MED ORDER — METFORMIN HCL 500 MG PO TABS: 500.0000 mg | ORAL_TABLET | Freq: Two times a day (BID) | ORAL | Status: DC

## 2013-12-01 MED ORDER — FLUTICASONE PROPIONATE 50 MCG/ACT NA SUSP: 2.0000 | Freq: Every day | NASAL | Status: DC

## 2013-12-01 MED ORDER — LEVOTHYROXINE SODIUM 150 MCG PO TABS: ORAL_TABLET | ORAL | Status: DC

## 2013-12-01 MED ORDER — HYDROCODONE-HOMATROPINE 5-1.5 MG/5ML PO SYRP: 5.0000 mL | ORAL_SOLUTION | ORAL | Status: DC | PRN

## 2013-12-01 MED ORDER — HYDROCHLOROTHIAZIDE 12.5 MG PO CAPS: ORAL_CAPSULE | ORAL | Status: DC

## 2013-12-01 MED ORDER — DILTIAZEM HCL ER COATED BEADS 420 MG PO TB24: 420.0000 mg | ORAL_TABLET | Freq: Every day | ORAL | Status: DC

## 2013-12-01 MED ORDER — OSELTAMIVIR PHOSPHATE 75 MG PO CAPS: 75.0000 mg | ORAL_CAPSULE | Freq: Two times a day (BID) | ORAL | Status: DC

## 2013-12-01 NOTE — Progress Notes (Signed)
Subjective: Charles Bowman is a 53 year old man who got sick yesterday. Last night he had hot and cold chills he feels pretty miserable. He had sneezing, runny nose, cough. He has some headache and bodyaches. He did have a flu shot this year.  Objective: Looks like he doesn't feel too well. His TMs are normal. Throat clear. Neck supple without significant nodes. Chest is clear to auscultation. Heart regular without murmurs.  Assessment: Flulike illness History of hyperglycemia Hypothyroidism Hypertension, controlled  Plan: Patient needs some refills on his routine thyroid medication and followup with Gen. needs. He goes to the hematologist for following up his hemoglobin problem.  Hemoglobin A1c, glucose, C. met TSH  Results for orders placed in visit on 12/01/13  GLUCOSE, POCT (MANUAL RESULT ENTRY)      Result Value Range   POC Glucose 118 (*) 70 - 99 mg/dl  POCT GLYCOSYLATED HEMOGLOBIN (HGB A1C)      Result Value Range   Hemoglobin A1C 6.6    POCT INFLUENZA A/B      Result Value Range   Influenza A, POC Positive     Influenza B, POC Negative     Patient is diabetic. We discussed the need for regular exercise and watching his weight and sugar intake. We'll place him on metformin 500 mg daily  Treat his influenza with Tamiflu. Also gave her prescription for prophylactic Tamiflu for his wife today  Refill his other medications and requested he return in 6 months. He will need to metformin at that time the all the others have been refilled for a year.

## 2013-12-01 NOTE — Patient Instructions (Signed)
Weight loss and exercise  Metformin one daily with main meal  Tamiflu one twice daily for influenza  Cough syrup as needed 1 teaspoon every 4 hours  Rest and fluid  Return if worse

## 2013-12-02 LAB — LIPID PANEL
Cholesterol: 195 mg/dL (ref 0–200)
HDL: 40 mg/dL (ref >39)
LDL CALC: 122 mg/dL — AB (ref 0–99)
Total CHOL/HDL Ratio: 4.9 Ratio
Triglycerides: 163 mg/dL — ABNORMAL HIGH (ref <150)
VLDL: 33 mg/dL (ref 0–40)

## 2013-12-03 MED ORDER — LEVOTHYROXINE SODIUM 175 MCG PO TABS: 175.0000 ug | ORAL_TABLET | Freq: Every day | ORAL | Status: DC

## 2013-12-03 NOTE — Addendum Note (Signed)
Addended by: Constance Goltz on: 12/03/2013 09:37 AM   Modules accepted: Orders

## 2013-12-04 ENCOUNTER — Telehealth: Payer: Self-pay

## 2013-12-04 NOTE — Telephone Encounter (Signed)
RTC

## 2013-12-04 NOTE — Telephone Encounter (Signed)
PATIENT SEEN FOR FLU  NOW COUGHING UP BLOOD (SAME AS FIVE YEARS AGO)  WENT TO [place] FIVE YEARS AGO - WOULD LIKE THE SAME ANTIBIOTIC AS THEN   [phone]

## 2013-12-04 NOTE — Telephone Encounter (Signed)
Does pt need to RTC? 

## 2013-12-06 NOTE — Telephone Encounter (Signed)
Spoke with pt since his initial call the coughing up of blood has improved. He is feeling better and I advised him if the blood does return he would need to come in and be evaluated. He understood and will do that if necessary. He also asked his metformin that Dr Linna Darner placed him on. According to Hopper's note he wants pt to take 500mg  daily, pt advised.

## 2013-12-11 ENCOUNTER — Other Ambulatory Visit: Payer: Self-pay | Admitting: Adult Health

## 2014-02-12 ENCOUNTER — Other Ambulatory Visit: Payer: Self-pay | Admitting: Adult Health

## 2014-02-12 DIAGNOSIS — D473 Essential (hemorrhagic) thrombocythemia: Secondary | ICD-10-CM

## 2014-04-04 ENCOUNTER — Other Ambulatory Visit: Payer: Self-pay | Admitting: *Deleted

## 2014-04-04 DIAGNOSIS — D473 Essential (hemorrhagic) thrombocythemia: Secondary | ICD-10-CM

## 2014-04-04 MED ORDER — ALPRAZOLAM 0.5 MG PO TABS: 0.5000 mg | ORAL_TABLET | Freq: Every evening | ORAL | Status: DC | PRN

## 2014-04-05 ENCOUNTER — Telehealth: Payer: Self-pay | Admitting: Hematology and Oncology

## 2014-04-05 NOTE — Telephone Encounter (Signed)
per neilsa - triage (04/04/14) former pt of KK to see NG 7/10. appts for lb/LC to be moved to NG. NG out 7/10 and appt for lb/NG schceduled for 7/9. lmonvm informiing pt of new d/t/provider and mailed schedule.

## 2014-05-11 ENCOUNTER — Telehealth: Payer: Self-pay | Admitting: Hematology and Oncology

## 2014-05-11 ENCOUNTER — Other Ambulatory Visit (HOSPITAL_BASED_OUTPATIENT_CLINIC_OR_DEPARTMENT_OTHER): Payer: PRIVATE HEALTH INSURANCE

## 2014-05-11 ENCOUNTER — Ambulatory Visit (HOSPITAL_BASED_OUTPATIENT_CLINIC_OR_DEPARTMENT_OTHER): Payer: PRIVATE HEALTH INSURANCE | Admitting: Hematology and Oncology

## 2014-05-11 ENCOUNTER — Encounter: Payer: Self-pay | Admitting: Hematology and Oncology

## 2014-05-11 VITALS — BP 149/81 | HR 78 | Temp 97.0°F | Resp 18 | Ht 66.75 in | Wt 182.1 lb

## 2014-05-11 DIAGNOSIS — D649 Anemia, unspecified: Secondary | ICD-10-CM

## 2014-05-11 DIAGNOSIS — D473 Essential (hemorrhagic) thrombocythemia: Secondary | ICD-10-CM

## 2014-05-11 DIAGNOSIS — F411 Generalized anxiety disorder: Secondary | ICD-10-CM

## 2014-05-11 DIAGNOSIS — D509 Iron deficiency anemia, unspecified: Secondary | ICD-10-CM

## 2014-05-11 DIAGNOSIS — F419 Anxiety disorder, unspecified: Secondary | ICD-10-CM

## 2014-05-11 HISTORY — DX: Anxiety disorder, unspecified: F41.9

## 2014-05-11 LAB — COMPREHENSIVE METABOLIC PANEL (CC13)
ALBUMIN: 4 g/dL (ref 3.5–5.0)
ALT: 27 U/L (ref 0–55)
ANION GAP: 10 meq/L (ref 3–11)
AST: 19 U/L (ref 5–34)
Alkaline Phosphatase: 103 U/L (ref 40–150)
BUN: 18.4 mg/dL (ref 7.0–26.0)
CO2: 24 meq/L (ref 22–29)
Calcium: 9.3 mg/dL (ref 8.4–10.4)
Chloride: 104 mEq/L (ref 98–109)
Creatinine: 1.1 mg/dL (ref 0.7–1.3)
GLUCOSE: 179 mg/dL — AB (ref 70–140)
POTASSIUM: 4 meq/L (ref 3.5–5.1)
Sodium: 138 mEq/L (ref 136–145)
Total Bilirubin: 0.35 mg/dL (ref 0.20–1.20)
Total Protein: 7.2 g/dL (ref 6.4–8.3)

## 2014-05-11 LAB — CBC WITH DIFFERENTIAL/PLATELET
BASO%: 0.4 % (ref 0.0–2.0)
Basophils Absolute: 0 10*3/uL (ref 0.0–0.1)
EOS ABS: 0.1 10*3/uL (ref 0.0–0.5)
EOS%: 1.1 % (ref 0.0–7.0)
HCT: 36.3 % — ABNORMAL LOW (ref 38.4–49.9)
HGB: 11.8 g/dL — ABNORMAL LOW (ref 13.0–17.1)
LYMPH%: 20 % (ref 14.0–49.0)
MCH: 25.8 pg — ABNORMAL LOW (ref 27.2–33.4)
MCHC: 32.6 g/dL (ref 32.0–36.0)
MCV: 79.1 fL — AB (ref 79.3–98.0)
MONO#: 0.3 10*3/uL (ref 0.1–0.9)
MONO%: 5.2 % (ref 0.0–14.0)
NEUT#: 3.6 10*3/uL (ref 1.5–6.5)
NEUT%: 73.3 % (ref 39.0–75.0)
Platelets: 182 10*3/uL (ref 140–400)
RBC: 4.59 10*6/uL (ref 4.20–5.82)
RDW: 18.5 % — AB (ref 11.0–14.6)
WBC: 4.9 10*3/uL (ref 4.0–10.3)
lymph#: 1 10*3/uL (ref 0.9–3.3)

## 2014-05-11 LAB — IRON AND TIBC CHCC
%SAT: 24 % (ref 20–55)
IRON: 76 ug/dL (ref 42–163)
TIBC: 324 ug/dL (ref 202–409)
UIBC: 248 ug/dL (ref 117–376)

## 2014-05-11 LAB — FERRITIN CHCC: FERRITIN: 64 ng/mL (ref 22–316)

## 2014-05-11 MED ORDER — ANAGRELIDE HCL 1 MG PO CAPS: 3.0000 mg | ORAL_CAPSULE | Freq: Every day | ORAL | Status: DC

## 2014-05-11 MED ORDER — ALPRAZOLAM 0.5 MG PO TABS: 0.5000 mg | ORAL_TABLET | Freq: Every evening | ORAL | Status: DC | PRN

## 2014-05-11 NOTE — Telephone Encounter (Signed)
lmonvm advising the pt of his 6 mnth f/u appt

## 2014-05-12 ENCOUNTER — Ambulatory Visit: Payer: PRIVATE HEALTH INSURANCE | Admitting: Adult Health

## 2014-05-12 ENCOUNTER — Other Ambulatory Visit: Payer: PRIVATE HEALTH INSURANCE

## 2014-05-12 NOTE — Assessment & Plan Note (Addendum)
His platelet counts under control. I discussed with him the risk of thrombosis even in the absence of persistent thrombocytosis. The patient also has multiple risk factors for heart disease and stroke. He agreed to take aspirin 81 mg daily. He will remain on the current dose of anagrelide.

## 2014-05-12 NOTE — Assessment & Plan Note (Signed)
He requested a refill of Xanax. I gave him one more refill but no further. I recommend a consult with his primary care provider for future refills.

## 2014-05-12 NOTE — Progress Notes (Signed)
[facility] FOLLOW-UP progress notes  Patient Care Team: No Pcp Per Patient as PCP - General (General Practice)  CHIEF COMPLAINTS/PURPOSE OF VISIT:  Essential thrombocytosis  HISTORY OF PRESENTING ILLNESS:  Charles Bowman 53 y.o. male was transferred to my care after his prior physician has left.  I reviewed the patient's records extensive and collaborated the history with the patient. Summary of his history is as follows: Patient was diagnosed with thrombocythemia in 5. He initially presented with peripheral cyanosis. Bone marrow aspirate and biopsy confirm myeloproliferative disorder with increased platelet counts. He was on Hydrea and lost to follow up and had been non-compliant.  In June 2004 he came in to see Dr. Truddie Coco and his plt count was 1380.  He was started on Hydrea, however it was difficulty to manage his dose due to Leukopenia.  He had a bone marrow biopsy on 05/24/03 which only noted megakaryocytosis. He was started on Anagrelide in 2005 and has been tolerating it well and is currently on $RemoveBefo'3mg'myaGreIpUTV$  daily. His platelets have been in the 200s for the past year.  He was also found to have iron deficiency, and has been on oral iron in the past.   He has been compliant taking anagrelide. He denies diagnosis of blood clots.   MEDICAL HISTORY:  Past Medical History  Diagnosis Date  . Anemia   . Diabetes mellitus without complication   . Hypertension   . Thyroid disease   . Cancer   . Anxiety 05/11/2014    SURGICAL HISTORY: Past Surgical History  Procedure Laterality Date  . Bone marrow biopsy    . Bronchoscopy      SOCIAL HISTORY: History   Social History  . Marital Status: Married    Spouse Name: N/A    Number of Children: N/A  . Years of Education: N/A   Occupational History  . Not on file.   Social History Main Topics  . Smoking status: Former Smoker -- 1.50 packs/day for 20 years    Types: Cigarettes    Quit date: 09/02/2008  . Smokeless tobacco:  Never Used  . Alcohol Use: No  . Drug Use: No  . Sexual Activity: Yes   Other Topics Concern  . Not on file   Social History Narrative  . No narrative on file    FAMILY HISTORY: Family History  Problem Relation Age of Onset  . Cancer Father     prostate ca    ALLERGIES:  has No Known Allergies.  MEDICATIONS:  Current Outpatient Prescriptions  Medication Sig Dispense Refill  . ALPRAZolam (XANAX) 0.5 MG tablet Take 1 tablet (0.5 mg total) by mouth at bedtime as needed for anxiety or sleep.  90 tablet  0  . anagrelide (AGRYLIN) 1 MG capsule Take 3 capsules (3 mg total) by mouth daily.  90 capsule  3  . diltiazem (MATZIM LA) 420 MG 24 hr tablet Take 1 tablet (420 mg total) by mouth daily.  30 tablet  0  . diltiazem (MATZIM LA) 420 MG 24 hr tablet Take 1 tablet (420 mg total) by mouth daily.  30 tablet  12  . fluticasone (FLONASE) 50 MCG/ACT nasal spray Place 2 sprays into both nostrils daily.  16 g  6  . glucose blood (ONE TOUCH ULTRA TEST) test strip Use as instructed  100 each  0  . hydrochlorothiazide (MICROZIDE) 12.5 MG capsule TAKE ONE CAPSULE BY MOUTH EVERY DAY  30 capsule  12  . HYDROcodone-homatropine (HYCODAN) 5-1.5  MG/5ML syrup Take 5 mLs by mouth every 4 (four) hours as needed for cough.  120 mL  0  . Lancets (ONETOUCH ULTRASOFT) lancets Use as instructed  100 each  0  . levothyroxine (SYNTHROID) 175 MCG tablet Take 1 tablet (175 mcg total) by mouth daily before breakfast.  90 tablet  1  . metFORMIN (GLUCOPHAGE) 500 MG tablet Take 1 tablet (500 mg total) by mouth 2 (two) times daily with a meal.  35 tablet  5  . Multiple Vitamins-Minerals (MULTIVITAMIN WITH MINERALS) tablet Take 1 tablet by mouth daily.       No current facility-administered medications for this visit.    REVIEW OF SYSTEMS:   Constitutional: Denies fevers, chills or abnormal night sweats Eyes: Denies blurriness of vision, double vision or watery eyes Ears, nose, mouth, throat, and face: Denies  mucositis or sore throat Respiratory: Denies cough, dyspnea or wheezes Cardiovascular: Denies palpitation, chest discomfort or lower extremity swelling Gastrointestinal:  Denies nausea, heartburn or change in bowel habits Skin: Denies abnormal skin rashes Lymphatics: Denies new lymphadenopathy or easy bruising Neurological:Denies numbness, tingling or new weaknesses Behavioral/Psych: Mood is stable, no new changes  All other systems were reviewed with the patient and are negative.  PHYSICAL EXAMINATION: ECOG PERFORMANCE STATUS: 0 - Asymptomatic  Filed Vitals:   05/11/14 1425  BP: 149/81  Pulse: 78  Temp: 97 F (36.1 C)  Resp: 18   Filed Weights   05/11/14 1425  Weight: 182 lb 1.6 oz (82.6 kg)    GENERAL:alert, no distress and comfortable SKIN: skin color, texture, turgor are normal, no rashes or significant lesions EYES: normal, conjunctiva are pink and non-injected, sclera clear OROPHARYNX:no exudate, normal lips, buccal mucosa, and tongue  NECK: supple, thyroid normal size, non-tender, without nodularity LYMPH:  no palpable lymphadenopathy in the cervical, axillary or inguinal LUNGS: clear to auscultation and percussion with normal breathing effort HEART: regular rate & rhythm and no murmurs without lower extremity edema ABDOMEN:abdomen soft, non-tender and normal bowel sounds. No hepatosplenomegaly Musculoskeletal:no cyanosis of digits and no clubbing  PSYCH: alert & oriented x 3 with fluent speech NEURO: no focal motor/sensory deficits  LABORATORY DATA:  I have reviewed the data as listed Lab Results  Component Value Date   WBC 4.9 05/11/2014   HGB 11.8* 05/11/2014   HCT 36.3* 05/11/2014   MCV 79.1* 05/11/2014   PLT 182 05/11/2014    Recent Labs  11/11/13 1456 12/01/13 1456 05/11/14 1416  NA 139 131* 138  K 4.1 4.1 4.0  CL  --  96  --   CO2 _0 GLUCOSE 163* 124* 179*  BUN 20.6 16 18.4  CREATININE 1.2 1.01 1.1  CALCIUM 9.4 9.2 9.3  PROT 7.3 7.0 7.2   ALBUMIN 4.1 4.5 4.0  AST _1 ALT _2 ALKPHOS 104 98 103  BILITOT 0.34 0.6 0.35   ASSESSMENT & PLAN:  THROMBOCYTHEMIA His platelet counts under control. I discussed with him the risk of thrombosis even in the absence of persistent thrombocytosis. The patient also has multiple risk factors for heart disease and stroke. He agreed to take aspirin 81 mg daily. He will remain on the current dose of anagrelide.  ANEMIA This is likely anemia of chronic disease. The patient denies recent history of bleeding such as epistaxis, hematuria or hematochezia. He is asymptomatic from the anemia. We will observe for now.   Anxiety He requested a refill of Xanax. I gave him one more  refill but no further. I recommend a consult with his primary care provider for future refills.    Orders Placed This Encounter  Procedures  . CBC with Differential    Standing Status: Future     Number of Occurrences:      Standing Expiration Date: 05/11/2015  . Comprehensive metabolic panel    Standing Status: Future     Number of Occurrences:      Standing Expiration Date: 05/11/2015  . Lactate dehydrogenase    Standing Status: Future     Number of Occurrences:      Standing Expiration Date: 05/11/2015    All questions were answered. The patient knows to call the clinic with any problems, questions or concerns. I spent 25 minutes counseling the patient face to face. The total time spent in the appointment was 30 minutes and more than 50% was on counseling.     Wendell, Artemus, MD 05/12/2014 7:52 AM

## 2014-05-12 NOTE — Assessment & Plan Note (Signed)
This is likely anemia of chronic disease. The patient denies recent history of bleeding such as epistaxis, hematuria or hematochezia. He is asymptomatic from the anemia. We will observe for now.  

## 2014-06-08 ENCOUNTER — Other Ambulatory Visit: Payer: Self-pay | Admitting: Family Medicine

## 2014-07-07 ENCOUNTER — Other Ambulatory Visit: Payer: Self-pay | Admitting: Family Medicine

## 2014-07-07 ENCOUNTER — Other Ambulatory Visit: Payer: Self-pay | Admitting: Physician Assistant

## 2014-07-12 ENCOUNTER — Ambulatory Visit (INDEPENDENT_AMBULATORY_CARE_PROVIDER_SITE_OTHER): Payer: PRIVATE HEALTH INSURANCE | Admitting: Emergency Medicine

## 2014-07-12 VITALS — BP 172/60 | HR 82 | Temp 98.0°F | Resp 16 | Ht 67.5 in | Wt 180.2 lb

## 2014-07-12 DIAGNOSIS — E039 Hypothyroidism, unspecified: Secondary | ICD-10-CM

## 2014-07-12 DIAGNOSIS — D473 Essential (hemorrhagic) thrombocythemia: Secondary | ICD-10-CM

## 2014-07-12 DIAGNOSIS — I10 Essential (primary) hypertension: Secondary | ICD-10-CM

## 2014-07-12 DIAGNOSIS — D75839 Thrombocytosis, unspecified: Secondary | ICD-10-CM

## 2014-07-12 DIAGNOSIS — E119 Type 2 diabetes mellitus without complications: Secondary | ICD-10-CM

## 2014-07-12 LAB — POCT CBC
Granulocyte percent: 70.5 %G (ref 37–80)
HCT, POC: 38.9 % — AB (ref 43.5–53.7)
Hemoglobin: 12.8 g/dL — AB (ref 14.1–18.1)
LYMPH, POC: 1.1 (ref 0.6–3.4)
MCH, POC: 26.3 pg — AB (ref 27–31.2)
MCHC: 32.9 g/dL (ref 31.8–35.4)
MCV: 80.1 fL (ref 80–97)
MID (cbc): 0.3 (ref 0–0.9)
MPV: 8.5 fL (ref 0–99.8)
POC Granulocyte: 3.3 (ref 2–6.9)
POC LYMPH %: 23.5 % (ref 10–50)
POC MID %: 6 %M (ref 0–12)
Platelet Count, POC: 269 10*3/uL (ref 142–424)
RBC: 4.86 M/uL (ref 4.69–6.13)
RDW, POC: 17.6 %
WBC: 4.7 10*3/uL (ref 4.6–10.2)

## 2014-07-12 LAB — POCT GLYCOSYLATED HEMOGLOBIN (HGB A1C): HEMOGLOBIN A1C: 6.2

## 2014-07-12 MED ORDER — FLUTICASONE PROPIONATE 50 MCG/ACT NA SUSP: NASAL | Status: DC

## 2014-07-12 MED ORDER — TEMAZEPAM 30 MG PO CAPS: 30.0000 mg | ORAL_CAPSULE | Freq: Every evening | ORAL | Status: DC | PRN

## 2014-07-12 MED ORDER — LEVOTHYROXINE SODIUM 175 MCG PO TABS: 175.0000 ug | ORAL_TABLET | Freq: Every day | ORAL | Status: DC

## 2014-07-12 MED ORDER — LISINOPRIL 10 MG PO TABS: 10.0000 mg | ORAL_TABLET | Freq: Every day | ORAL | Status: DC

## 2014-07-12 MED ORDER — HYDROCHLOROTHIAZIDE 12.5 MG PO CAPS: ORAL_CAPSULE | ORAL | Status: DC

## 2014-07-12 MED ORDER — METFORMIN HCL ER 500 MG PO TB24: ORAL_TABLET | ORAL | Status: DC

## 2014-07-12 MED ORDER — DILTIAZEM HCL ER COATED BEADS 420 MG PO TB24: 420.0000 mg | ORAL_TABLET | Freq: Every day | ORAL | Status: DC

## 2014-07-12 NOTE — Progress Notes (Signed)
[facility] [address] [place] [address] [phone]  Date:  07/12/2014   Name:  Charles Bowman   DOB:  09/04/1961   MRN:  [id number]  PCP:  No PCP Per Patient    Chief Complaint: Medication Refill   History of Present Illness:  Charles Bowman is a 53 y.o. very pleasant male patient who presents with the following:  History of HBP and NIDDM.  Last visit started on metformin and has not followed up.  Now here as he is out of metformin.   Says his blood pressure is running high and always has.  No complaint of end organ injury. Comes to the office requesting refills. Has difficulty sleeping and falling asleep and has been on xanax for sleep.   No improvement with over the counter medications or other home remedies.  Denies other complaint or health concern today.    Patient Active Problem List   Diagnosis Date Noted  . Anxiety 05/11/2014  . HEMOPTYSIS 09/07/2008  . 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 MYELOPROLIFERATIVE DISORDER 09/06/2008  . HYPOTHYROIDISM 09/06/2008  . ANEMIA 09/06/2008  . HYPERTENSION 09/06/2008    Past Medical History  Diagnosis Date  . Anemia   . Diabetes mellitus without complication   . Hypertension   . Thyroid disease   . Cancer   . Anxiety 05/11/2014    Past Surgical History  Procedure Laterality Date  . Bone marrow biopsy    . Bronchoscopy      History  Substance Use Topics  . Smoking status: Former Smoker -- 1.50 packs/day for 20 years    Types: Cigarettes    Quit date: 09/02/2008  . Smokeless tobacco: Never Used  . Alcohol Use: No    Family History  Problem Relation Age of Onset  . Cancer Father     prostate ca    No Known Allergies  Medication list has been reviewed and updated.  Current Outpatient Prescriptions on File Prior to Visit  Medication Sig Dispense Refill  . ALPRAZolam (XANAX) 0.5 MG tablet Take 1 tablet (0.5 mg total) by mouth at bedtime as needed for anxiety or sleep.  90 tablet  0  .  anagrelide (AGRYLIN) 1 MG capsule Take 3 capsules (3 mg total) by mouth daily.  90 capsule  3  . diltiazem (MATZIM LA) 420 MG 24 hr tablet Take 1 tablet (420 mg total) by mouth daily.  30 tablet  12  . fluticasone (FLONASE) 50 MCG/ACT nasal spray PLACE 2 SPRAYS INTO BOTH NOSTRILS DAILY.  16 g  4  . glucose blood (ONE TOUCH ULTRA TEST) test strip Use as instructed  100 each  0  . hydrochlorothiazide (MICROZIDE) 12.5 MG capsule TAKE ONE CAPSULE BY MOUTH EVERY DAY  30 capsule  12  . Lancets (ONETOUCH ULTRASOFT) lancets Use as instructed  100 each  0  . levothyroxine (SYNTHROID, LEVOTHROID) 175 MCG tablet Take 1 tablet (175 mcg total) by mouth daily. NO MORE REFILLS WITHOUT OFFICE VISIT - 2ND NOTICE  15 tablet  0  . Multiple Vitamins-Minerals (MULTIVITAMIN WITH MINERALS) tablet Take 1 tablet by mouth daily.       No current facility-administered medications on file prior to visit.    Review of Systems:  As per HPI, otherwise negative.    Physical Examination: Filed Vitals:   07/12/14 1918  BP: 172/60  Pulse: 82  Temp: 98 F (36.7 C)  Resp: 16   Filed Vitals:   07/12/14 1918  Height: 5' 7.5" (1.715 m)  Weight: 180 lb 3.2 oz (81.738 kg)   Body mass index is 27.79 kg/(m^2). Ideal Body Weight: Weight in (lb) to have BMI = 25: 161.7  GEN: WDWN, NAD, Non-toxic, A & O x 3 HEENT: Atraumatic, Normocephalic. Neck supple. No masses, No LAD. Ears and Nose: No external deformity. CV: RRR, No M/G/R. No JVD. No thrill. No extra heart sounds. PULM: CTA B, no wheezes, crackles, rhonchi. No retractions. No resp. distress. No accessory muscle use. ABD: S, NT, ND, +BS. No rebound. No HSM. EXTR: No c/c/e NEURO Normal gait.  PSYCH: Normally interactive. Conversant. Not depressed or anxious appearing.  Calm demeanor.    Assessment and Plan: NIDDM  HBP Increase metformin Add lisinopril for renal protection To follow up in 6 months Lab pending.  Signed,  Ellison Carwin, MD   Results for  orders placed in visit on 07/12/14  POCT GLYCOSYLATED HEMOGLOBIN (HGB A1C)      Result Value Ref Range   Hemoglobin A1C 6.2    POCT CBC      Result Value Ref Range   WBC 4.7  4.6 - 10.2 K/uL   Lymph, poc 1.1  0.6 - 3.4   POC LYMPH PERCENT 23.5  10 - 50 %L   MID (cbc) 0.3  0 - 0.9   POC MID % 6.0  0 - 12 %M   POC Granulocyte 3.3  2 - 6.9   Granulocyte percent 70.5  37 - 80 %G   RBC 4.86  4.69 - 6.13 M/uL   Hemoglobin 12.8 (*) 14.1 - 18.1 g/dL   HCT, POC 38.9 (*) 43.5 - 53.7 %   MCV 80.1  80 - 97 fL   MCH, POC 26.3 (*) 27 - 31.2 pg   MCHC 32.9  31.8 - 35.4 g/dL   RDW, POC 17.6     Platelet Count, POC 269  142 - 424 K/uL   MPV 8.5  0 - 99.8 fL

## 2014-07-13 ENCOUNTER — Other Ambulatory Visit: Payer: Self-pay | Admitting: Emergency Medicine

## 2014-07-13 LAB — COMPREHENSIVE METABOLIC PANEL
ALT: 27 U/L (ref 0–53)
AST: 19 U/L (ref 0–37)
Albumin: 4.5 g/dL (ref 3.5–5.2)
Alkaline Phosphatase: 98 U/L (ref 39–117)
BILIRUBIN TOTAL: 0.3 mg/dL (ref 0.2–1.2)
BUN: 21 mg/dL (ref 6–23)
CO2: 25 meq/L (ref 19–32)
CREATININE: 0.92 mg/dL (ref 0.50–1.35)
Calcium: 9.6 mg/dL (ref 8.4–10.5)
Chloride: 101 mEq/L (ref 96–112)
Glucose, Bld: 97 mg/dL (ref 70–99)
Potassium: 4.2 mEq/L (ref 3.5–5.3)
Sodium: 136 mEq/L (ref 135–145)
Total Protein: 7.3 g/dL (ref 6.0–8.3)

## 2014-07-13 LAB — LIPID PANEL
Cholesterol: 200 mg/dL (ref 0–200)
HDL: 39 mg/dL — ABNORMAL LOW (ref >39)
LDL Cholesterol: 104 mg/dL — ABNORMAL HIGH (ref 0–99)
Total CHOL/HDL Ratio: 5.1 Ratio
Triglycerides: 283 mg/dL — ABNORMAL HIGH (ref <150)
VLDL: 57 mg/dL — ABNORMAL HIGH (ref 0–40)

## 2014-07-13 LAB — TSH: TSH: 3.512 u[IU]/mL (ref 0.350–4.500)

## 2014-07-13 MED ORDER — ATORVASTATIN CALCIUM 20 MG PO TABS: 20.0000 mg | ORAL_TABLET | Freq: Every day | ORAL | Status: DC

## 2014-08-22 ENCOUNTER — Other Ambulatory Visit: Payer: Self-pay | Admitting: Hematology and Oncology

## 2014-08-23 ENCOUNTER — Other Ambulatory Visit: Payer: Self-pay | Admitting: Hematology and Oncology

## 2014-08-24 ENCOUNTER — Telehealth: Payer: Self-pay

## 2014-08-24 ENCOUNTER — Other Ambulatory Visit: Payer: Self-pay | Admitting: *Deleted

## 2014-08-24 MED ORDER — LORAZEPAM 1 MG PO TABS: 1.0000 mg | ORAL_TABLET | Freq: Three times a day (TID) | ORAL | Status: DC

## 2014-08-24 NOTE — Telephone Encounter (Signed)
The medication that pt takes for his platelet count causes anxiety. Pt is asking for Xanax due to the increase in anxiety. This has nothing to do with sleep.     Spoke to Dr. Ouida Sills and advised to send in script for Ativan 1mg  TID PRN anxiety #30 2 refills

## 2014-08-24 NOTE — Telephone Encounter (Signed)
Dr Ouida Sills normally does not recommend Xanax, will forward to him to advise if he would like to try something else.

## 2014-08-24 NOTE — Telephone Encounter (Signed)
No.  Xanax is not a sleeping pill.

## 2014-08-24 NOTE — Telephone Encounter (Signed)
Patient called. States he cannot take the medication Dr. Ouida Sills prescribed him to help him rest. Patient states he would like xanax instead. States he has taken this before and wants someone to call him about this. Please return call and advise. CB # [phone]

## 2014-08-24 NOTE — Telephone Encounter (Signed)
Called in script to [facility]. Pt notified.

## 2014-09-20 ENCOUNTER — Ambulatory Visit (INDEPENDENT_AMBULATORY_CARE_PROVIDER_SITE_OTHER): Payer: PRIVATE HEALTH INSURANCE | Admitting: Physician Assistant

## 2014-09-20 VITALS — BP 124/70 | HR 74 | Temp 97.9°F | Resp 16 | Ht 67.5 in | Wt 184.8 lb

## 2014-09-20 DIAGNOSIS — J069 Acute upper respiratory infection, unspecified: Secondary | ICD-10-CM

## 2014-09-20 DIAGNOSIS — E119 Type 2 diabetes mellitus without complications: Secondary | ICD-10-CM | POA: Insufficient documentation

## 2014-09-20 DIAGNOSIS — B9789 Other viral agents as the cause of diseases classified elsewhere: Principal | ICD-10-CM

## 2014-09-20 DIAGNOSIS — R011 Cardiac murmur, unspecified: Secondary | ICD-10-CM

## 2014-09-20 DIAGNOSIS — R010 Benign and innocent cardiac murmurs: Secondary | ICD-10-CM

## 2014-09-20 DIAGNOSIS — Z1211 Encounter for screening for malignant neoplasm of colon: Secondary | ICD-10-CM

## 2014-09-20 MED ORDER — IPRATROPIUM BROMIDE 0.03 % NA SOLN: 2.0000 | Freq: Two times a day (BID) | NASAL | Status: DC

## 2014-09-20 MED ORDER — AMOXICILLIN-POT CLAVULANATE 875-125 MG PO TABS: 1.0000 | ORAL_TABLET | Freq: Two times a day (BID) | ORAL | Status: DC

## 2014-09-20 NOTE — Progress Notes (Signed)
Subjective:    Patient ID: Charles Bowman, male    DOB: 1960-11-22, 53 y.o.   MRN: [id number]   PCP: No PCP Per Patient "I always just come here."  Chief Complaint  Patient presents with  . URI    head congestion, sore throat, cough with green sputum  for 5 days.      No Known Allergies  Patient Active Problem List   Diagnosis Date Noted  . Diabetes mellitus type 2, uncomplicated 29/79/8921  . Anxiety 05/11/2014  . 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 MYELOPROLIFERATIVE DISORDER 09/06/2008  . HYPOTHYROIDISM 09/06/2008  . ANEMIA 09/06/2008  . HYPERTENSION 09/06/2008    Prior to Admission medications   Medication Sig Start Date End Date Taking? Authorizing Provider  anagrelide (AGRYLIN) 1 MG capsule Take 3 capsules (3 mg total) by mouth daily. 05/11/14  Yes Heath Lark, MD  atorvastatin (LIPITOR) 20 MG tablet Take 1 tablet (20 mg total) by mouth daily. 07/13/14  Yes Roselee Culver, MD  diltiazem (MATZIM LA) 420 MG 24 hr tablet Take 1 tablet (420 mg total) by mouth daily. 07/12/14  Yes Roselee Culver, MD  fluticasone (FLONASE) 50 MCG/ACT nasal spray PLACE 2 SPRAYS INTO BOTH NOSTRILS DAILY. 07/12/14  Yes Roselee Culver, MD  glucose blood (ONE TOUCH ULTRA TEST) test strip Use as instructed 11/15/13  Yes Barton Fanny, MD  hydrochlorothiazide (MICROZIDE) 12.5 MG capsule TAKE ONE CAPSULE BY MOUTH EVERY DAY 07/12/14  Yes Roselee Culver, MD  Lancets [facility] ULTRASOFT) lancets Use as instructed 11/15/13  Yes Barton Fanny, MD  levothyroxine (SYNTHROID, LEVOTHROID) 175 MCG tablet Take 1 tablet (175 mcg total) by mouth daily. 07/12/14  Yes Roselee Culver, MD  lisinopril (PRINIVIL,ZESTRIL) 10 MG tablet Take 1 tablet (10 mg total) by mouth daily. 07/12/14  Yes Roselee Culver, MD  LORazepam (ATIVAN) 1 MG tablet Take 1 tablet (1 mg total) by mouth every 8 (eight) hours. Take 1 tablet TID PRN for anxiety 08/24/14  Yes Roselee Culver, MD  metFORMIN (GLUCOPHAGE XR) 500 MG 24  hr tablet 2 daily with evening meal for 1 week and then 3 for a week and 4 after. 07/12/14  Yes Roselee Culver, MD  Multiple Vitamins-Minerals (MULTIVITAMIN WITH MINERALS) tablet Take 1 tablet by mouth daily.   Yes Historical Provider, MD    Medical, Surgical, Family and Social History reviewed and updated.  HPI  This 53 y.o. male presents for evaluation of 5 days of head congestion, drainage, sore throat. Cough and nose blowing produces green sputum. No fever, chills. Former smoker, quit 2009. 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 Symptoms worse at night, scratchy throat, pain with swallowing.  Last visit for diabetes maintenance was 3 months ago. He reports no problems or concerns. Tolerates medications without adverse effects. Sees a dentist regularly. Hasn't seen an eye specialist recently, "I'm due for a visit." Received flu vaccine at work. He has not received the pneumococcal vaccine that he can recall nor that I find on chart review.   Review of Systems As above. No CP, SOB, HA, dizziness, nausea, vomiting, diarrhea, increased thirst or urination.    Objective:   Physical Exam  Constitutional: He is oriented to person, place, and time. Vital signs are normal. He appears well-developed and well-nourished. No distress.  BP 124/70 mmHg  Pulse 74  Temp(Src)  97.9 F (36.6 C) (Oral)  Resp 16  Ht 5' 7.5" (1.715 m)  Wt 184 lb 12.8 oz (83.825 kg)  BMI 28.50 kg/m2  SpO2 98%   HENT:  Head: Normocephalic and atraumatic.  Right Ear: Hearing, tympanic membrane, external ear and ear canal normal.  Left Ear: Hearing, tympanic membrane, external ear and ear canal normal.  Nose: Mucosal edema and rhinorrhea present.  No foreign bodies. Right sinus exhibits no maxillary sinus tenderness and no frontal sinus tenderness. Left sinus exhibits no maxillary sinus  tenderness and no frontal sinus tenderness.  Mouth/Throat: Uvula is midline, oropharynx is clear and moist and mucous membranes are normal. No uvula swelling. No oropharyngeal exudate.  Eyes: Conjunctivae and EOM are normal. Pupils are equal, round, and reactive to light. Right eye exhibits no discharge. Left eye exhibits no discharge. No scleral icterus.  Neck: Trachea normal, normal range of motion and full passive range of motion without pain. Neck supple. No thyroid mass and no thyromegaly present.  Cardiovascular: Normal rate and regular rhythm.  Exam reveals no gallop.   Murmur heard.  Systolic (heard best in the aortic space) murmur is present with a grade of 2/6  Murmur not noted on previous exams, patient not aware of previously appreciated murmur.  Pulmonary/Chest: Effort normal and breath sounds normal.  Lymphadenopathy:       Head (right side): No submandibular, no tonsillar, no preauricular, no posterior auricular and no occipital adenopathy present.       Head (left side): No submandibular, no tonsillar, no preauricular and no occipital adenopathy present.    He has no cervical adenopathy.       Right: No supraclavicular adenopathy present.       Left: No supraclavicular adenopathy present.  Neurological: He is alert and oriented to person, place, and time. He has normal strength. No cranial nerve deficit or sensory deficit.  Skin: Skin is warm, dry and intact. No rash noted.  Psychiatric: He has a normal mood and affect. His speech is normal and behavior is normal.          Assessment & Plan:  1. Viral URI with cough Supportive care. If no improvement in the next 48-72 hours, he can fill the prescription to cover for sinusitis. - ipratropium (ATROVENT) 0.03 % nasal spray; Place 2 sprays into both nostrils 2 (two) times daily.  Dispense: 30 mL; Refill: 0 - amoxicillin-clavulanate (AUGMENTIN) 875-125 MG per tablet; Take 1 tablet by mouth 2 (two) times daily.  Dispense: 20  tablet; Refill: 0  2. Diabetes mellitus type 2, uncomplicated Due for follow-up, but declines that today.  Needs foot exam, urine microalbumin, pneumococcal vaccine. May need Tdap (he'll check his records). Encouraged he schedule with his eye specialist.  3. Screening for colon cancer Due for initial screening colonoscopy. - Ambulatory referral to Gastroenterology  4. Cardiac murmur, previously undiagnosed - Ambulatory referral to Cardiology  Return in about 4 weeks (around 10/18/2014), or if symptoms worsen or fail to improve, for follow-up of diabetes, fasting labs and pneumococcal vaccine.  Fara Chute, PA-C Physician Assistant-Certified [facility]

## 2014-09-20 NOTE — Patient Instructions (Signed)
Get plenty of rest and drink at least 64 ounces of water daily. Take the Mucinex you bought this morning.

## 2014-10-05 ENCOUNTER — Other Ambulatory Visit: Payer: Self-pay | Admitting: Emergency Medicine

## 2014-11-03 ENCOUNTER — Other Ambulatory Visit: Payer: Self-pay | Admitting: Physician Assistant

## 2014-11-09 ENCOUNTER — Encounter: Payer: Self-pay | Admitting: Internal Medicine

## 2014-11-09 ENCOUNTER — Ambulatory Visit (INDEPENDENT_AMBULATORY_CARE_PROVIDER_SITE_OTHER): Payer: PRIVATE HEALTH INSURANCE | Admitting: Internal Medicine

## 2014-11-09 VITALS — BP 182/84 | HR 95 | Ht 67.0 in | Wt 184.1 lb

## 2014-11-09 DIAGNOSIS — D508 Other iron deficiency anemias: Secondary | ICD-10-CM

## 2014-11-09 DIAGNOSIS — R011 Cardiac murmur, unspecified: Secondary | ICD-10-CM

## 2014-11-09 DIAGNOSIS — F419 Anxiety disorder, unspecified: Secondary | ICD-10-CM

## 2014-11-09 DIAGNOSIS — I493 Ventricular premature depolarization: Secondary | ICD-10-CM

## 2014-11-09 DIAGNOSIS — I10 Essential (primary) hypertension: Secondary | ICD-10-CM

## 2014-11-09 DIAGNOSIS — R0683 Snoring: Secondary | ICD-10-CM

## 2014-11-09 DIAGNOSIS — R002 Palpitations: Secondary | ICD-10-CM

## 2014-11-09 DIAGNOSIS — D473 Essential (hemorrhagic) thrombocythemia: Secondary | ICD-10-CM

## 2014-11-09 DIAGNOSIS — R0681 Apnea, not elsewhere classified: Secondary | ICD-10-CM

## 2014-11-09 HISTORY — DX: Cardiac murmur, unspecified: R01.1

## 2014-11-09 NOTE — Progress Notes (Signed)
OFFICE NOTE  Chief Complaint:  Murmur  Primary Care Physician: No PCP Per Patient  HPI:  Charles Bowman is a pleasant 54 year old male who was Referred to me for evaluation of murmur. He is no history of murmur in the past was recently seen in urgent care and for a upper respiratory infection. During that exam he was noted to have a soft systolic murmur which was graded as 2 out of 6. He is now referred for evaluation of that murmur. Recently denies any chest pain or worsening shortness of breath. EKG in the office today shows sinus rhythm with occasional PVCs. He is unaware of palpitations. 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 He is maintained on anagrelide. He also says history of diabetes and hypertension. He has borderline dyslipidemia. His wife notes recently that he's been snoring and occasionally stops breathing. His EPWSS was 7.  PMHx:  Past Medical History  Diagnosis Date  . Anemia   . Diabetes mellitus without complication   . Hypertension   . Thyroid disease   . Cancer   . Anxiety 05/11/2014  . Murmur 11/09/2014    Past Surgical History  Procedure Laterality Date  . Bone marrow biopsy    . Bronchoscopy      FAMHx:  Family History  Problem Relation Age of Onset  . Cancer Father     prostate ca  . Dementia Father 55    2008  . COPD Mother     +Tobacco Use    SOCHx:   reports that he quit smoking about 6 years ago. His smoking use included Cigarettes. He has a 30 pack-year smoking history. He has never used smokeless tobacco. He reports that he does not drink alcohol or use illicit drugs.  ALLERGIES:  No Known Allergies  ROS: A comprehensive review of systems was negative except for: Constitutional: positive for murmur Cardiovascular: positive for fatigue and .  HOME MEDS: Current Outpatient Prescriptions  Medication Sig Dispense Refill  . amoxicillin-clavulanate (AUGMENTIN) 875-125 MG per tablet Take 1 tablet by mouth 2 (two)  times daily. 20 tablet 0  . anagrelide (AGRYLIN) 1 MG capsule Take 3 capsules (3 mg total) by mouth daily. 90 capsule 3  . diltiazem (MATZIM LA) 420 MG 24 hr tablet Take 1 tablet (420 mg total) by mouth daily. 30 tablet 12  . fluticasone (FLONASE) 50 MCG/ACT nasal spray PLACE 2 SPRAYS INTO BOTH NOSTRILS DAILY. 16 g 12  . glucose blood (ONE TOUCH ULTRA TEST) test strip Use as instructed 100 each 0  . hydrochlorothiazide (MICROZIDE) 12.5 MG capsule TAKE ONE CAPSULE BY MOUTH EVERY DAY 30 capsule 12  . ipratropium (ATROVENT) 0.03 % nasal spray Place 2 sprays into both nostrils 2 (two) times daily. 30 mL 0  . Lancets (ONETOUCH ULTRASOFT) lancets Use as instructed 100 each 0  . levothyroxine (SYNTHROID, LEVOTHROID) 175 MCG tablet Take 1 tablet (175 mcg total) by mouth daily. 30 tablet 5  . lisinopril (PRINIVIL,ZESTRIL) 10 MG tablet Take 1 tablet (10 mg total) by mouth daily. 90 tablet 3  . LORazepam (ATIVAN) 1 MG tablet Take 1 tablet (1 mg total) by mouth every 8 (eight) hours. Take 1 tablet TID PRN for anxiety 30 tablet 2  . metFORMIN (GLUCOPHAGE-XR) 500 MG 24 hr tablet TAKE 4 TABLETS BY MOUTH EVERY DAY "2ND NEEDS FOLLOW UP FOR ADDITIONAL REFILLS" 120 tablet 0  . Multiple Vitamins-Minerals (MULTIVITAMIN WITH MINERALS) tablet Take 1 tablet by mouth daily.  No current facility-administered medications for this visit.    LABS/IMAGING: No results found for this or any previous visit (from the past 48 hour(s)). No results found.  VITALS: BP 182/84 mmHg  Pulse 95  Ht '5\' 7"'  (1.702 m)  Wt 184 lb 1.6 oz (83.507 kg)  BMI 28.83 kg/m2  EXAM: General appearance: alert and no distress Neck: no carotid bruit and no JVD Lungs: clear to auscultation bilaterally Heart: regular rate and rhythm, S1, S2 normal, no murmur, click, rub or gallop Abdomen: soft, non-tender; bowel sounds normal; no masses,  no organomegaly Extremities: extremities normal, atraumatic, no cyanosis or edema Pulses: 2+ and  symmetric Skin: Skin color, texture, turgor normal. No rashes or lesions Neurologic: Grossly normal Psych: Normal  EKG: Sinus rhythm with occasional PVCs at 95  ASSESSMENT: 1. Recent murmur, not auscultated today-likely a flow murmur 2. PVCs 3. Fatigue, snoring, possible sleep apnea 4. Hypertension 5. Diabetes type 2  PLAN: 1.   Charles Bowman did not have an audible murmur today on exam but is having frequent PVCs. He is unaware of these and they could represent ischemia or be benign. He's not had any cardiac testing recently and I would recommend a treadmill exercise stress test. I suspect his murmur is likely a flow murmur given his history of anemia and other possible causes of a systolic murmur. He does have fatigue, snoring and witnessed apnea events per his wife, therefore we'll go ahead and schedule him for a sleep study. There is no clear indication for an echocardiogram at this time.  Thanks for the kind referral and I'll be contact with the results of his stress test and sleep study.  Pixie Casino, MD, [facility] Attending Cardiologist [facility]  Nahlia Hellmann C 11/09/2014, 5:17 PM

## 2014-11-09 NOTE — Patient Instructions (Signed)
Your physician has requested that you have an exercise tolerance test. For further information please visit [email or web address]. Please also follow instruction sheet, as given.  Your physician has recommended that you have a sleep study. This test records several body functions during sleep, including: brain activity, eye movement, oxygen and carbon dioxide blood levels, heart rate and rhythm, breathing rate and rhythm, the flow of air through your mouth and nose, snoring, body muscle movements, and chest and belly movement. >> this is done at [facility] wants you to follow-up in: 1 month (after your tests)

## 2014-11-14 ENCOUNTER — Other Ambulatory Visit (HOSPITAL_BASED_OUTPATIENT_CLINIC_OR_DEPARTMENT_OTHER): Payer: PRIVATE HEALTH INSURANCE

## 2014-11-14 ENCOUNTER — Encounter: Payer: Self-pay | Admitting: Hematology and Oncology

## 2014-11-14 ENCOUNTER — Ambulatory Visit: Payer: PRIVATE HEALTH INSURANCE | Admitting: Hematology and Oncology

## 2014-11-14 ENCOUNTER — Ambulatory Visit (HOSPITAL_BASED_OUTPATIENT_CLINIC_OR_DEPARTMENT_OTHER): Payer: PRIVATE HEALTH INSURANCE | Admitting: Hematology and Oncology

## 2014-11-14 VITALS — BP 151/66 | HR 64 | Temp 97.7°F | Resp 20 | Ht 67.0 in | Wt 187.1 lb

## 2014-11-14 DIAGNOSIS — D649 Anemia, unspecified: Secondary | ICD-10-CM

## 2014-11-14 DIAGNOSIS — F419 Anxiety disorder, unspecified: Secondary | ICD-10-CM

## 2014-11-14 DIAGNOSIS — D473 Essential (hemorrhagic) thrombocythemia: Secondary | ICD-10-CM

## 2014-11-14 DIAGNOSIS — D6489 Other specified anemias: Secondary | ICD-10-CM

## 2014-11-14 DIAGNOSIS — R011 Cardiac murmur, unspecified: Secondary | ICD-10-CM

## 2014-11-14 LAB — CBC WITH DIFFERENTIAL/PLATELET
BASO%: 0.2 % (ref 0.0–2.0)
BASOS ABS: 0 10*3/uL (ref 0.0–0.1)
EOS%: 1.1 % (ref 0.0–7.0)
Eosinophils Absolute: 0.1 10*3/uL (ref 0.0–0.5)
HCT: 37.5 % — ABNORMAL LOW (ref 38.4–49.9)
HGB: 12.1 g/dL — ABNORMAL LOW (ref 13.0–17.1)
LYMPH%: 29.1 % (ref 14.0–49.0)
MCH: 26.1 pg — ABNORMAL LOW (ref 27.2–33.4)
MCHC: 32.3 g/dL (ref 32.0–36.0)
MCV: 80.8 fL (ref 79.3–98.0)
MONO#: 0.3 10*3/uL (ref 0.1–0.9)
MONO%: 5.9 % (ref 0.0–14.0)
NEUT#: 3.4 10*3/uL (ref 1.5–6.5)
NEUT%: 63.7 % (ref 39.0–75.0)
Platelets: 228 10*3/uL (ref 140–400)
RBC: 4.64 10*6/uL (ref 4.20–5.82)
RDW: 16 % — AB (ref 11.0–14.6)
WBC: 5.3 10*3/uL (ref 4.0–10.3)
lymph#: 1.5 10*3/uL (ref 0.9–3.3)

## 2014-11-14 LAB — LACTATE DEHYDROGENASE (CC13): LDH: 178 U/L (ref 125–245)

## 2014-11-14 LAB — COMPREHENSIVE METABOLIC PANEL (CC13)
ALBUMIN: 4 g/dL (ref 3.5–5.0)
ALK PHOS: 99 U/L (ref 40–150)
ALT: 26 U/L (ref 0–55)
AST: 23 U/L (ref 5–34)
Anion Gap: 9 mEq/L (ref 3–11)
BUN: 20.3 mg/dL (ref 7.0–26.0)
CALCIUM: 9.3 mg/dL (ref 8.4–10.4)
CO2: 27 mEq/L (ref 22–29)
Chloride: 102 mEq/L (ref 98–109)
Creatinine: 1 mg/dL (ref 0.7–1.3)
EGFR: 89 mL/min/{1.73_m2} — ABNORMAL LOW (ref >90)
Glucose: 106 mg/dl (ref 70–140)
POTASSIUM: 4.4 meq/L (ref 3.5–5.1)
Sodium: 138 mEq/L (ref 136–145)
Total Bilirubin: 0.26 mg/dL (ref 0.20–1.20)
Total Protein: 7.3 g/dL (ref 6.4–8.3)

## 2014-11-14 MED ORDER — ANAGRELIDE HCL 1 MG PO CAPS: 3.0000 mg | ORAL_CAPSULE | Freq: Every day | ORAL | Status: DC

## 2014-11-14 NOTE — Assessment & Plan Note (Signed)
He tolerated treatment well. I recommend continue the same

## 2014-11-14 NOTE — Assessment & Plan Note (Signed)
This is likely anemia of chronic disease. The patient denies recent history of bleeding such as epistaxis, hematuria or hematochezia. He is asymptomatic from the anemia. We will observe for now.  

## 2014-11-14 NOTE — Progress Notes (Signed)
[facility] OFFICE PROGRESS NOTE  Patient Care Team: No Pcp Per Patient as PCP - General (General Practice) Heath Lark, MD as Consulting Physician (Hematology and Oncology)  SUMMARY OF ONCOLOGIC HISTORY:   THROMBOCYTHEMIA   11/03/1984 Cancer Diagnosis He was noted to have significant abnormal CBC   05/24/2003 Bone Marrow Biopsy Case #: [id number] BM biopsy confirmed persistent thrombocythemia   11/06/2003 -  Chemotherapy He was placed on anagrelide    INTERVAL HISTORY: Please see below for problem oriented charting. He feels well. He had one episode of URI last year. Denies recent bleeding. No diagnosis of blood clots. He was found to have heart murmur and is undergoing evaluation  REVIEW OF SYSTEMS:   Constitutional: Denies fevers, chills or abnormal weight loss Eyes: Denies blurriness of vision Ears, nose, mouth, throat, and face: Denies mucositis or sore throat Respiratory: Denies cough, dyspnea or wheezes Cardiovascular: Denies palpitation, chest discomfort or lower extremity swelling Gastrointestinal:  Denies nausea, heartburn or change in bowel habits Skin: Denies abnormal skin rashes Lymphatics: Denies new lymphadenopathy or easy bruising Neurological:Denies numbness, tingling or new weaknesses Behavioral/Psych: Mood is stable, no new changes  All other systems were reviewed with the patient and are negative.  I have reviewed the past medical history, past surgical history, social history and family history with the patient and they are unchanged from previous note.  ALLERGIES:  has No Known Allergies.  MEDICATIONS:  Current Outpatient Prescriptions  Medication Sig Dispense Refill  . anagrelide (AGRYLIN) 1 MG capsule Take 3 capsules (3 mg total) by mouth daily. 270 capsule 3  . diltiazem (MATZIM LA) 420 MG 24 hr tablet Take 1 tablet (420 mg total) by mouth daily. 30 tablet 12  . fluticasone (FLONASE) 50 MCG/ACT nasal spray PLACE 2 SPRAYS INTO BOTH NOSTRILS  DAILY. 16 g 12  . glucose blood (ONE TOUCH ULTRA TEST) test strip Use as instructed 100 each 0  . hydrochlorothiazide (MICROZIDE) 12.5 MG capsule TAKE ONE CAPSULE BY MOUTH EVERY DAY 30 capsule 12  . ipratropium (ATROVENT) 0.03 % nasal spray Place 2 sprays into both nostrils 2 (two) times daily. 30 mL 0  . Lancets (ONETOUCH ULTRASOFT) lancets Use as instructed 100 each 0  . levothyroxine (SYNTHROID, LEVOTHROID) 175 MCG tablet Take 1 tablet (175 mcg total) by mouth daily. 30 tablet 5  . lisinopril (PRINIVIL,ZESTRIL) 10 MG tablet Take 1 tablet (10 mg total) by mouth daily. 90 tablet 3  . LORazepam (ATIVAN) 1 MG tablet Take 1 tablet (1 mg total) by mouth every 8 (eight) hours. Take 1 tablet TID PRN for anxiety 30 tablet 2  . metFORMIN (GLUCOPHAGE-XR) 500 MG 24 hr tablet TAKE 4 TABLETS BY MOUTH EVERY DAY "2ND NEEDS FOLLOW UP FOR ADDITIONAL REFILLS" 120 tablet 0  . Multiple Vitamins-Minerals (MULTIVITAMIN WITH MINERALS) tablet Take 1 tablet by mouth daily.     No current facility-administered medications for this visit.    PHYSICAL EXAMINATION: ECOG PERFORMANCE STATUS: 0 - Asymptomatic  Filed Vitals:   11/14/14 1509  BP: 151/66  Pulse: 64  Temp: 97.7 F (36.5 C)  Resp: 20   Filed Weights   11/14/14 1509  Weight: 187 lb 1.6 oz (84.868 kg)    GENERAL:alert, no distress and comfortable. He is obese SKIN: skin color, texture, turgor are normal, no rashes or significant lesions EYES: normal, Conjunctiva are pink and non-injected, sclera clear OROPHARYNX:no exudate, no erythema and lips, buccal mucosa, and tongue normal  NECK: supple, thyroid normal size, non-tender, without nodularity  LYMPH:  no palpable lymphadenopathy in the cervical, axillary or inguinal LUNGS: clear to auscultation and percussion with normal breathing effort HEART: regular rate & rhythm and no murmurs and no lower extremity edema ABDOMEN:abdomen soft, non-tender and normal bowel sounds Musculoskeletal:no cyanosis of  digits and no clubbing  NEURO: alert & oriented x 3 with fluent speech, no focal motor/sensory deficits  LABORATORY DATA:  I have reviewed the data as listed    Component Value Date/Time   NA 138 11/14/2014 1500   NA 136 07/12/2014 2012   K 4.4 11/14/2014 1500   K 4.2 07/12/2014 2012   CL 101 07/12/2014 2012   CL 105 09/17/2012 1030   CO2 27 11/14/2014 1500   CO2 25 07/12/2014 2012   GLUCOSE 106 11/14/2014 1500   GLUCOSE 97 07/12/2014 2012   GLUCOSE 140* 09/17/2012 1030   BUN 20.3 11/14/2014 1500   BUN 21 07/12/2014 2012   CREATININE 1.0 11/14/2014 1500   CREATININE 0.92 07/12/2014 2012   CREATININE 1.08 02/13/2012 1236   CALCIUM 9.3 11/14/2014 1500   CALCIUM 9.6 07/12/2014 2012   PROT 7.3 11/14/2014 1500   PROT 7.3 07/12/2014 2012   ALBUMIN 4.0 11/14/2014 1500   ALBUMIN 4.5 07/12/2014 2012   AST 23 11/14/2014 1500   AST 19 07/12/2014 2012   ALT 26 11/14/2014 1500   ALT 27 07/12/2014 2012   ALKPHOS 99 11/14/2014 1500   ALKPHOS 98 07/12/2014 2012   BILITOT 0.26 11/14/2014 1500   BILITOT 0.3 07/12/2014 2012   GFRNONAA >90 08/28/2011 1020   GFRAA >90 08/28/2011 1020    No results found for: SPEP, UPEP  Lab Results  Component Value Date   WBC 5.3 11/14/2014   NEUTROABS 3.4 11/14/2014   HGB 12.1* 11/14/2014   HCT 37.5* 11/14/2014   MCV 80.8 11/14/2014   PLT 228 11/14/2014      Chemistry      Component Value Date/Time   NA 138 11/14/2014 1500   NA 136 07/12/2014 2012   K 4.4 11/14/2014 1500   K 4.2 07/12/2014 2012   CL 101 07/12/2014 2012   CL 105 09/17/2012 1030   CO2 27 11/14/2014 1500   CO2 25 07/12/2014 2012   BUN 20.3 11/14/2014 1500   BUN 21 07/12/2014 2012   CREATININE 1.0 11/14/2014 1500   CREATININE 0.92 07/12/2014 2012   CREATININE 1.08 02/13/2012 1236      Component Value Date/Time   CALCIUM 9.3 11/14/2014 1500   CALCIUM 9.6 07/12/2014 2012   ALKPHOS 99 11/14/2014 1500   ALKPHOS 98 07/12/2014 2012   AST 23 11/14/2014 1500   AST 19  07/12/2014 2012   ALT 26 11/14/2014 1500   ALT 27 07/12/2014 2012   BILITOT 0.26 11/14/2014 1500   BILITOT 0.3 07/12/2014 2012     ASSESSMENT & PLAN:  THROMBOCYTHEMIA He tolerated treatment well. I recommend continue the same  Anemia This is likely anemia of chronic disease. The patient denies recent history of bleeding such as epistaxis, hematuria or hematochezia. He is asymptomatic from the anemia. We will observe for now.    Murmur He is undergoing cardiac evaluation. I recommend maximum medical management. I recommend he takes lipitor and aspirin   Orders Placed This Encounter  Procedures  . CBC with Differential    Standing Status: Future     Number of Occurrences:      Standing Expiration Date: 12/19/2015   All questions were answered. The patient knows to call the clinic with  any problems, questions or concerns. No barriers to learning was detected. I spent 15 minutes counseling the patient face to face. The total time spent in the appointment was 20 minutes and more than 50% was on counseling and review of test results     [facility], [place], MD 11/14/2014 3:45 PM

## 2014-11-14 NOTE — Assessment & Plan Note (Signed)
He is undergoing cardiac evaluation. I recommend maximum medical management. I recommend he takes lipitor and aspirin

## 2014-11-15 ENCOUNTER — Telehealth: Payer: Self-pay | Admitting: Hematology and Oncology

## 2014-11-20 ENCOUNTER — Telehealth: Payer: Self-pay | Admitting: Internal Medicine

## 2014-11-20 ENCOUNTER — Other Ambulatory Visit: Payer: Self-pay | Admitting: *Deleted

## 2014-11-20 DIAGNOSIS — D473 Essential (hemorrhagic) thrombocythemia: Secondary | ICD-10-CM

## 2014-11-20 DIAGNOSIS — F419 Anxiety disorder, unspecified: Secondary | ICD-10-CM

## 2014-11-20 MED ORDER — ANAGRELIDE HCL 1 MG PO CAPS: 3.0000 mg | ORAL_CAPSULE | Freq: Every day | ORAL | Status: DC

## 2014-11-21 NOTE — Telephone Encounter (Signed)
Close encounter 

## 2014-11-28 ENCOUNTER — Telehealth (HOSPITAL_COMMUNITY): Payer: Self-pay

## 2014-11-28 NOTE — Telephone Encounter (Signed)
Encounter complete. 

## 2014-11-29 ENCOUNTER — Telehealth (HOSPITAL_COMMUNITY): Payer: Self-pay

## 2014-11-29 NOTE — Telephone Encounter (Signed)
Encounter complete. 

## 2014-11-30 ENCOUNTER — Ambulatory Visit (HOSPITAL_COMMUNITY)
Admission: RE | Admit: 2014-11-30 | Discharge: 2014-11-30 | Disposition: A | Discharge status: Home / Self Care | Payer: PRIVATE HEALTH INSURANCE | Source: Ambulatory Visit | Attending: Cardiovascular Disease | Admitting: Cardiovascular Disease

## 2014-11-30 DIAGNOSIS — R011 Cardiac murmur, unspecified: Secondary | ICD-10-CM | POA: Insufficient documentation

## 2014-11-30 DIAGNOSIS — I493 Ventricular premature depolarization: Secondary | ICD-10-CM

## 2014-11-30 DIAGNOSIS — R002 Palpitations: Secondary | ICD-10-CM | POA: Diagnosis not present

## 2014-11-30 NOTE — Procedures (Addendum)
Exercise Treadmill Test  Pre-Exercise Testing Evaluation NSR with occasional monomorphic PVCs  Test  Exercise Tolerance Test Ordering MD: Pixie Casino MD  Interpreting MD:   Unique Test No: 1 Treadmill:  1  Indication for ETT: Systolic Heart Murmur-Rule out Ischemia  Contraindication to ETT: No   Stress Modality: exercise - treadmill  Cardiac Imaging Performed: non   Protocol: standard Bruce - maximal  Max BP:  215/53  Max MPHR (bpm):  169 85% MPR (bpm):  144  MPHR obtained (bpm): 141 % MPHR obtained:83  Reached 85% MPHR (min:sec):  N/A Total Exercise Time (min-sec): 10:11  Workload in METS:  12.00 Borg Scale:   Reason ETT Terminated: leg fatigue    ST Segment Analysis At Rest: normal ST segments - no evidence of significant ST depression With Exercise: no evidence of significant ST depression  Other Information Arrhythmia:  PVCs decrease in frequency during exercise and increase in recovery Angina during ETT:  absent (0) Quality of ETT:  diagnostic  ETT Interpretation:  normal - no evidence of ischemia by ST analysis  Comments: Good exercise tolerance. Hypertensive response to exercise. Monomorphic PVCs are noted at rest, diminish during exercise and return with invreased frequency during recovery  Charles Klein, MD, [facility] [phone] office [phone] pager

## 2014-12-11 ENCOUNTER — Other Ambulatory Visit: Payer: Self-pay | Admitting: Physician Assistant

## 2014-12-13 ENCOUNTER — Ambulatory Visit: Payer: PRIVATE HEALTH INSURANCE | Admitting: Internal Medicine

## 2014-12-14 ENCOUNTER — Ambulatory Visit (INDEPENDENT_AMBULATORY_CARE_PROVIDER_SITE_OTHER): Payer: PRIVATE HEALTH INSURANCE | Admitting: Internal Medicine

## 2014-12-14 ENCOUNTER — Encounter: Payer: Self-pay | Admitting: Internal Medicine

## 2014-12-14 VITALS — BP 170/84 | HR 69 | Ht 67.5 in | Wt 187.7 lb

## 2014-12-14 DIAGNOSIS — I10 Essential (primary) hypertension: Secondary | ICD-10-CM

## 2014-12-14 DIAGNOSIS — F419 Anxiety disorder, unspecified: Secondary | ICD-10-CM

## 2014-12-14 DIAGNOSIS — I493 Ventricular premature depolarization: Secondary | ICD-10-CM

## 2014-12-14 NOTE — Patient Instructions (Signed)
Your physician wants you to follow-up in: 6 months with Dr. Hilty. You will receive a reminder letter in the mail two months in advance. If you don't receive a letter, please call our office to schedule the follow-up appointment.    

## 2014-12-14 NOTE — Progress Notes (Signed)
OFFICE NOTE  Chief Complaint:  Follow-up  Primary Care Physician: No PCP Per Patient  HPI:  Charles Bowman is a pleasant 54 year old male who was Referred to me for evaluation of murmur. He is no history of murmur in the past was recently seen in urgent care and for a upper respiratory infection. During that exam he was noted to have a soft systolic murmur which was graded as 2 out of 6. He is now referred for evaluation of that murmur. Recently denies any chest pain or worsening shortness of breath. EKG in the office today shows sinus rhythm with occasional PVCs. He is unaware of palpitations. 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 He is maintained on anagrelide. He also says history of diabetes and hypertension. He has borderline dyslipidemia. His wife notes recently that he's been snoring and occasionally stops breathing. His EPWSS was 7.  As Charles Bowman back in the office today. He successfully underwent an exercise treadmill stress test. This showed no evidence of ischemia. He was noted to have PVCs at rest which improved with exercise and then reoccurred at rest. He denies any chest pain. He is not yet had his sleep study which is scheduled for March.  PMHx:  Past Medical History  Diagnosis Date  . Anemia   . Diabetes mellitus without complication   . Hypertension   . Thyroid disease   . Cancer   . Anxiety 05/11/2014  . Murmur 11/09/2014    Past Surgical History  Procedure Laterality Date  . Bone marrow biopsy    . Bronchoscopy      FAMHx:  Family History  Problem Relation Age of Onset  . Cancer Father     prostate ca  . Dementia Father 61    2008  . COPD Mother     +Tobacco Use    SOCHx:   reports that he quit smoking about 6 years ago. His smoking use included Cigarettes. He has a 30 pack-year smoking history. He has never used smokeless tobacco. He reports that he does not drink alcohol or use illicit drugs.  ALLERGIES:  No Known  Allergies  ROS: A comprehensive review of systems was negative except for: Constitutional: positive for murmur Cardiovascular: positive for fatigue and .  HOME MEDS: Current Outpatient Prescriptions  Medication Sig Dispense Refill  . anagrelide (AGRYLIN) 1 MG capsule Take 3 capsules (3 mg total) by mouth daily. 270 capsule 3  . diltiazem (MATZIM LA) 420 MG 24 hr tablet Take 1 tablet (420 mg total) by mouth daily. 30 tablet 12  . fluticasone (FLONASE) 50 MCG/ACT nasal spray PLACE 2 SPRAYS INTO BOTH NOSTRILS DAILY. 16 g 12  . glucose blood (ONE TOUCH ULTRA TEST) test strip Use as instructed 100 each 0  . hydrochlorothiazide (MICROZIDE) 12.5 MG capsule TAKE ONE CAPSULE BY MOUTH EVERY DAY 30 capsule 12  . Lancets (ONETOUCH ULTRASOFT) lancets Use as instructed 100 each 0  . levothyroxine (SYNTHROID, LEVOTHROID) 175 MCG tablet Take 1 tablet (175 mcg total) by mouth daily. 30 tablet 5  . lisinopril (PRINIVIL,ZESTRIL) 10 MG tablet Take 1 tablet (10 mg total) by mouth daily. 90 tablet 3  . LORazepam (ATIVAN) 1 MG tablet Take 1 tablet (1 mg total) by mouth every 8 (eight) hours. Take 1 tablet TID PRN for anxiety 30 tablet 2  . metFORMIN (GLUCOPHAGE-XR) 500 MG 24 hr tablet TAKE 4 TABLETS BY MOUTH EVERY DAY  "NO MORE REFILLS WITHOUT OV" 120 tablet 0  .  Multiple Vitamins-Minerals (MULTIVITAMIN WITH MINERALS) tablet Take 1 tablet by mouth daily.     No current facility-administered medications for this visit.    LABS/IMAGING: No results found for this or any previous visit (from the past 48 hour(s)). No results found.  VITALS: BP 170/84 mmHg  Pulse 69  Ht 5' 7.5" (1.715 m)  Wt 187 lb 11.2 oz (85.14 kg)  BMI 28.95 kg/m2  EXAM: Deferred  EKG: Deferred  ASSESSMENT: 1. Recent murmur, not auscultated today-likely a flow murmur 2. PVCs 3. Fatigue, snoring, possible sleep apnea 4. Hypertension 5. Diabetes type 2  PLAN: 1.  Charles Bowman had a negative exercise treadmill stress test. His PVCs  abated with exercise suggesting they are likely benign RV outflow tract PVCs. He still scheduled to have a sleep study in March and we will treat him accordingly based on those results. I'll plan to see him back in 6 months for follow-up.  Charles Casino, MD, [facility] Attending Cardiologist [facility]  Charles Bowman 12/14/2014, 4:17 PM

## 2014-12-16 ENCOUNTER — Other Ambulatory Visit: Payer: Self-pay | Admitting: Emergency Medicine

## 2014-12-18 ENCOUNTER — Telehealth: Payer: Self-pay | Admitting: *Deleted

## 2014-12-18 NOTE — Telephone Encounter (Signed)
SPOKE WITH BRIAN AT [place]. ANAGRELIDE O.5MG  IS AVAILABLE. PT. WILL CHECK WITH [facility] AT [place] WHERE HE RECEIVED A PARTIAL FILL TO SEE IF THEY CAN OBTAIN THE ANAGRELIDE 0.5MG .

## 2014-12-19 ENCOUNTER — Telehealth: Payer: Self-pay | Admitting: *Deleted

## 2014-12-19 ENCOUNTER — Other Ambulatory Visit: Payer: Self-pay | Admitting: Hematology and Oncology

## 2014-12-19 ENCOUNTER — Telehealth: Payer: Self-pay

## 2014-12-19 DIAGNOSIS — F419 Anxiety disorder, unspecified: Secondary | ICD-10-CM

## 2014-12-19 DIAGNOSIS — D473 Essential (hemorrhagic) thrombocythemia: Secondary | ICD-10-CM

## 2014-12-19 MED ORDER — ANAGRELIDE HCL 0.5 MG PO CAPS: 3.0000 mg | ORAL_CAPSULE | Freq: Every day | ORAL | Status: DC

## 2014-12-19 MED ORDER — ANAGRELIDE HCL 1 MG PO CAPS: 3.0000 mg | ORAL_CAPSULE | Freq: Every day | ORAL | Status: DC

## 2014-12-19 NOTE — Telephone Encounter (Signed)
See notes

## 2014-12-19 NOTE — Telephone Encounter (Signed)
See neilsa's phone note of 2/15.

## 2014-12-19 NOTE — Telephone Encounter (Signed)
THIS NOTE TO [place] AND [place],RN.

## 2014-12-20 NOTE — Telephone Encounter (Signed)
Faxed

## 2015-01-19 ENCOUNTER — Other Ambulatory Visit: Payer: Self-pay | Admitting: Emergency Medicine

## 2015-01-19 ENCOUNTER — Ambulatory Visit (HOSPITAL_BASED_OUTPATIENT_CLINIC_OR_DEPARTMENT_OTHER): Payer: PRIVATE HEALTH INSURANCE | Attending: Internal Medicine | Admitting: Radiology

## 2015-01-19 VITALS — Ht 68.0 in | Wt 185.0 lb

## 2015-01-19 DIAGNOSIS — R0681 Apnea, not elsewhere classified: Secondary | ICD-10-CM | POA: Diagnosis not present

## 2015-01-19 DIAGNOSIS — R002 Palpitations: Secondary | ICD-10-CM | POA: Diagnosis not present

## 2015-01-19 DIAGNOSIS — R0683 Snoring: Secondary | ICD-10-CM | POA: Diagnosis not present

## 2015-01-19 DIAGNOSIS — I10 Essential (primary) hypertension: Secondary | ICD-10-CM | POA: Insufficient documentation

## 2015-01-19 DIAGNOSIS — G4733 Obstructive sleep apnea (adult) (pediatric): Secondary | ICD-10-CM | POA: Diagnosis not present

## 2015-01-22 ENCOUNTER — Other Ambulatory Visit: Payer: Self-pay

## 2015-01-22 NOTE — Telephone Encounter (Signed)
Patient wanting refills on metFORMIN (GLUCOPHAGE-XR) 500 MG 24 hr tablet [[id number]]  States that he will come in for ov on Thursday

## 2015-01-24 MED ORDER — METFORMIN HCL ER 500 MG PO TB24: ORAL_TABLET | ORAL | Status: DC

## 2015-01-24 NOTE — Telephone Encounter (Signed)
Notified pt on VM RF was sent in.

## 2015-01-25 ENCOUNTER — Telehealth: Payer: Self-pay

## 2015-01-25 ENCOUNTER — Other Ambulatory Visit: Payer: Self-pay | Admitting: Emergency Medicine

## 2015-01-25 ENCOUNTER — Ambulatory Visit (INDEPENDENT_AMBULATORY_CARE_PROVIDER_SITE_OTHER): Payer: PRIVATE HEALTH INSURANCE | Admitting: Emergency Medicine

## 2015-01-25 VITALS — BP 134/80 | HR 101 | Temp 97.6°F | Resp 18 | Ht 68.5 in | Wt 186.4 lb

## 2015-01-25 DIAGNOSIS — D473 Essential (hemorrhagic) thrombocythemia: Secondary | ICD-10-CM

## 2015-01-25 DIAGNOSIS — D75839 Thrombocytosis, unspecified: Secondary | ICD-10-CM

## 2015-01-25 DIAGNOSIS — E119 Type 2 diabetes mellitus without complications: Secondary | ICD-10-CM

## 2015-01-25 DIAGNOSIS — I10 Essential (primary) hypertension: Secondary | ICD-10-CM | POA: Diagnosis not present

## 2015-01-25 LAB — LIPID PANEL
Cholesterol: 140 mg/dL (ref 0–200)
HDL: 40 mg/dL (ref >=40)
LDL Cholesterol: 77 mg/dL (ref 0–99)
TRIGLYCERIDES: 116 mg/dL (ref <150)
Total CHOL/HDL Ratio: 3.5 Ratio
VLDL: 23 mg/dL (ref 0–40)

## 2015-01-25 LAB — COMPREHENSIVE METABOLIC PANEL
ALBUMIN: 4.4 g/dL (ref 3.5–5.2)
ALT: 28 U/L (ref 0–53)
AST: 19 U/L (ref 0–37)
Alkaline Phosphatase: 90 U/L (ref 39–117)
BUN: 16 mg/dL (ref 6–23)
CALCIUM: 9.5 mg/dL (ref 8.4–10.5)
CHLORIDE: 102 meq/L (ref 96–112)
CO2: 25 meq/L (ref 19–32)
CREATININE: 0.84 mg/dL (ref 0.50–1.35)
Glucose, Bld: 172 mg/dL — ABNORMAL HIGH (ref 70–99)
Potassium: 4.4 mEq/L (ref 3.5–5.3)
Sodium: 138 mEq/L (ref 135–145)
Total Bilirubin: 0.4 mg/dL (ref 0.2–1.2)
Total Protein: 7 g/dL (ref 6.0–8.3)

## 2015-01-25 LAB — GLUCOSE, POCT (MANUAL RESULT ENTRY): POC Glucose: 171 mg/dl — AB (ref 70–99)

## 2015-01-25 LAB — POCT GLYCOSYLATED HEMOGLOBIN (HGB A1C): HEMOGLOBIN A1C: 7.3

## 2015-01-25 MED ORDER — DILTIAZEM HCL ER COATED BEADS 420 MG PO TB24: 420.0000 mg | ORAL_TABLET | Freq: Every day | ORAL | Status: DC

## 2015-01-25 MED ORDER — LIRAGLUTIDE 18 MG/3ML ~~LOC~~ SOPN: 1.8000 mg | PEN_INJECTOR | Freq: Every day | SUBCUTANEOUS | Status: DC

## 2015-01-25 MED ORDER — LISINOPRIL 10 MG PO TABS: 10.0000 mg | ORAL_TABLET | Freq: Every day | ORAL | Status: DC

## 2015-01-25 MED ORDER — METFORMIN HCL ER 500 MG PO TB24: ORAL_TABLET | ORAL | Status: DC

## 2015-01-25 MED ORDER — LORAZEPAM 1 MG PO TABS: ORAL_TABLET | ORAL | Status: DC

## 2015-01-25 MED ORDER — HYDROCHLOROTHIAZIDE 12.5 MG PO CAPS: ORAL_CAPSULE | ORAL | Status: DC

## 2015-01-25 NOTE — Telephone Encounter (Signed)
Pt was seen this morning and forgot to ask what his A1C was.  Please call with this info  [phone]

## 2015-01-25 NOTE — Addendum Note (Signed)
Addended by: Roselee Culver on: 01/25/2015 10:07 AM   Modules accepted: Orders

## 2015-01-25 NOTE — Progress Notes (Signed)
[facility] [address], [place] [address] [phone]  Date:  01/25/2015   Name:  Charles Bowman   DOB:  1961/09/03   MRN:  [id number]  PCP:  No PCP Per Patient    Chief Complaint: Medication Refill and Tingling   History of Present Illness:  Charles Bowman is a 54 y.o. very pleasant male patient who presents with the following:  NIDDM needs refills Says his sugar (FBS) is running around 120 daily. Tolerating medication well. No adverse effect lipitor Some tingling in feet  Patient Active Problem List   Diagnosis Date Noted  . Murmur 11/09/2014  . PVC's (premature ventricular contractions) 11/09/2014  . Diabetes mellitus type 2, uncomplicated 14/38/8875  . Anxiety 05/11/2014  . 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 MYELOPROLIFERATIVE DISORDER 09/06/2008  . HYPOTHYROIDISM 09/06/2008  . Anemia 09/06/2008  . Essential hypertension 09/06/2008    Past Medical History  Diagnosis Date  . Anemia   . Diabetes mellitus without complication   . Hypertension   . Thyroid disease   . Cancer   . Anxiety 05/11/2014  . Murmur 11/09/2014    Past Surgical History  Procedure Laterality Date  . Bone marrow biopsy    . Bronchoscopy      History  Substance Use Topics  . Smoking status: Former Smoker -- 1.50 packs/day for 20 years    Types: Cigarettes    Quit date: 09/02/2008  . Smokeless tobacco: Never Used  . Alcohol Use: No    Family History  Problem Relation Age of Onset  . Cancer Father     prostate ca  . Dementia Father 45    2008  . COPD Mother     +Tobacco Use    No Known Allergies  Medication list has been reviewed and updated.  Current Outpatient Prescriptions on File Prior to Visit  Medication Sig Dispense Refill  . anagrelide (AGRYLIN) 1 MG capsule Take 3 capsules (3 mg total) by mouth daily. 270 capsule 3  . diltiazem (MATZIM LA) 420 MG 24 hr tablet Take 1 tablet (420 mg total) by mouth daily. 30 tablet 12  . fluticasone (FLONASE)  50 MCG/ACT nasal spray PLACE 2 SPRAYS INTO BOTH NOSTRILS DAILY. 16 g 12  . glucose blood (ONE TOUCH ULTRA TEST) test strip Use as instructed 100 each 0  . hydrochlorothiazide (MICROZIDE) 12.5 MG capsule TAKE ONE CAPSULE BY MOUTH EVERY DAY 30 capsule 12  . Lancets (ONETOUCH ULTRASOFT) lancets Use as instructed 100 each 0  . lisinopril (PRINIVIL,ZESTRIL) 10 MG tablet Take 1 tablet (10 mg total) by mouth daily. 90 tablet 3  . LORazepam (ATIVAN) 1 MG tablet TAKE 1 TABLET BY MOUTH 3 TIMES A DAY AS NEEDED FOR ANXIETY 30 tablet 2  . metFORMIN (GLUCOPHAGE-XR) 500 MG 24 hr tablet TAKE 4 TABLETS BY MOUTH EVERY DAY  "NO MORE REFILLS WITHOUT OV" 120 tablet 0  . Multiple Vitamins-Minerals (MULTIVITAMIN WITH MINERALS) tablet Take 1 tablet by mouth daily.    Marland Kitchen SYNTHROID 175 MCG tablet TAKE 1 TABLET (175 MCG TOTAL) BY MOUTH DAILY.  "OV NEEDED FOR ADDITIONAL REFILLS" 30 tablet 0   No current facility-administered medications on file prior to visit.    Review of Systems:  As per HPI, otherwise negative.    Physical Examination: Filed Vitals:   01/25/15 0918  BP: 134/80  Pulse: 101  Temp: 97.6 F (36.4 C)  Resp: 18   Filed Vitals:   01/25/15 0918  Height: 5' 8.5" (1.74  m)  Weight: 186 lb 6.4 oz (84.55 kg)   Body mass index is 27.93 kg/(m^2). Ideal Body Weight: Weight in (lb) to have BMI = 25: 166.5  GEN: WDWN, NAD, Non-toxic, A & O x 3 HEENT: Atraumatic, Normocephalic. Neck supple. No masses, No LAD. Ears and Nose: No external deformity. CV: RRR, No M/G/R. No JVD. No thrill. No extra heart sounds. PULM: CTA B, no wheezes, crackles, rhonchi. No retractions. No resp. distress. No accessory muscle use. ABD: S, NT, ND, +BS. No rebound. No HSM. EXTR: No c/c/e NEURO Normal gait.  PSYCH: Normally interactive. Conversant. Not depressed or anxious appearing.  Calm demeanor.    Assessment and Plan: NIDDM HLD Add victoza Signed,  Ellison Carwin, MD   Results for orders placed or performed  in visit on 01/25/15  POCT glucose (manual entry)  Result Value Ref Range   POC Glucose 171 (A) 70 - 99 mg/dl  POCT glycosylated hemoglobin (Hb A1C)  Result Value Ref Range   Hemoglobin A1C 7.3

## 2015-01-25 NOTE — Patient Instructions (Signed)
Liraglutide injection What is this medicine? LIRAGLUTIDE (LIR a GLOO tide) is used to improve blood sugar control in adults with type 2 diabetes. This medicine may be used with other oral diabetes medicines. This medicine may be used for other purposes; ask your health care provider or pharmacist if you have questions. COMMON BRAND NAME(S): Victoza What should I tell my health care provider before I take this medicine? They need to know if you have any of these conditions: -endocrine tumors (MEN 2) or if someone in your family had these tumors -gallstones -high cholesterol -history of alcohol abuse problem -history of pancreatitis -kidney disease or if you are on dialysis -liver disease -previous swelling of the tongue, face, or lips with difficulty breathing, difficulty swallowing, hoarseness, or tightening of the throat -stomach problems -suicidal thoughts, plans, or attempt; a previous suicide attempt by you or a family member -thyroid cancer or if someone in your family had thyroid cancer -an unusual or allergic reaction to liraglutide, medicines, foods, dyes, or preservatives -pregnant or trying to get pregnant -breast-feeding How should I use this medicine? This medicine is for injection under the skin of your upper leg, stomach area, or upper arm. You will be taught how to prepare and give this medicine. Use exactly as directed. Take your medicine at regular intervals. Do not take it more often than directed. It is important that you put your used needles and syringes in a special sharps container. Do not put them in a trash can. If you do not have a sharps container, call your pharmacist or healthcare provider to get one. A special MedGuide will be given to you by the pharmacist with each prescription and refill. Be sure to read this information carefully each time. Talk to your pediatrician regarding the use of this medicine in children. Special care may be needed. Overdosage: If  you think you've taken too much of this medicine contact a poison control center or emergency room at once. Overdosage: If you think you have taken too much of this medicine contact a poison control center or emergency room at once. NOTE: This medicine is only for you. Do not share this medicine with others. What if I miss a dose? If you miss a dose, take it as soon as you can. If it is almost time for your next dose, take only that dose. Do not take double or extra doses. What may interact with this medicine? -acetaminophen -atorvastatin -birth control pills -digoxin -griseofulvin -lisinoprilMany medications may cause changes in blood sugar, these include: -alcohol containing beverages -aspirin and aspirin-like drugs -chloramphenicol -chromium -diuretics -male hormones, such as estrogens or progestins, birth control pills -heart medicines -isoniazid -male hormones or anabolic steroids -medications for weight loss -medicines for allergies, asthma, cold, or cough -medicines for mental problems -medicines called MAO inhibitors - Nardil, Parnate, Marplan, Eldepryl -niacin -NSAIDS, such as ibuprofen -pentamidine -phenytoin -probenecid -quinolone antibiotics such as ciprofloxacin, levofloxacin, ofloxacin -some herbal dietary supplements -steroid medicines such as prednisone or cortisone -thyroid hormonesSome medications can hide the warning symptoms of low blood sugar (hypoglycemia). You may need to monitor your blood sugar more closely if you are taking one of these medications. These include: -beta-blockers, often used for high blood pressure or heart problems (examples include atenolol, metoprolol, propranolol) -clonidine -guanethidine -reserpine This list may not describe all possible interactions. Give your health care provider a list of all the medicines, herbs, non-prescription drugs, or dietary supplements you use. Also tell them if you smoke, drink alcohol, or  use illegal  drugs. Some items may interact with your medicine. What should I watch for while using this medicine? Visit your doctor or health care professional for regular checks on your progress. A test called the HbA1C (A1C) will be monitored. This is a simple blood test. It measures your blood sugar control over the last 2 to 3 months. You will receive this test every 3 to 6 months. Learn how to check your blood sugar. Learn the symptoms of low and high blood sugar and how to manage them. Always carry a quick-source of sugar with you in case you have symptoms of low blood sugar. Examples include hard sugar candy or glucose tablets. Make sure others know that you can choke if you eat or drink when you develop serious symptoms of low blood sugar, such as seizures or unconsciousness. They must get medical help at once. Tell your doctor or health care professional if you have high blood sugar. You might need to change the dose of your medicine. If you are sick or exercising more than usual, you might need to change the dose of your medicine. Do not skip meals. Ask your doctor or health care professional if you should avoid alcohol. Many nonprescription cough and cold products contain sugar or alcohol. These can affect blood sugar. Liraglutide pens and cartridges should never be shared. Even if the needle is changed, sharing may result in passing of viruses like hepatitis or HIV. Wear a medical ID bracelet or chain, and carry a card that describes your disease and details of your medicine and dosage times. Patients and their families should watch out for worsening depression or thoughts of suicide. Also watch out for sudden changes in feelings such as feeling anxious, agitated, panicky, irritable, hostile, aggressive, impulsive, severely restless, overly excited and hyperactive, or not being able to sleep. If this happens, especially at the beginning of treatment or after a change in dose, call your health care  professional. What side effects may I notice from receiving this medicine? Side effects that you should report to your doctor or health care professional as soon as possible: -allergic reactions like skin rash, itching or hives, swelling of the face, lips, or tongue -breathing problems -fever, chills -loss of appetite -signs and symptoms of low blood sugar such as feeling anxious, confusion, dizziness, increased hunger, unusually weak or tired, sweating, shakiness, cold, irritable, headache, blurred vision, fast heartbeat, loss of consciousness -trouble passing urine or change in the amount of urine -unusual stomach pain or upset -vomiting Side effects that usually do not require medical attention (Report these to your doctor or health care professional if they continue or are bothersome.): -constipation -diarrhea -fatigue -headache -nausea This list may not describe all possible side effects. Call your doctor for medical advice about side effects. You may report side effects to FDA at 1-800-FDA-1088. Where should I keep my medicine? Keep out of the reach of children. Store unopened pen in a refrigerator between 2 and 8 degrees C (36 and 46 degrees F). Do not freeze or use if the medicine has been frozen. Protect from light and excessive heat. After you first use the pen, it can be stored at room temperature between 15 and 30 degrees C (59 and 86 degrees F) or in a refrigerator. Throw away your used pen after 30 days or after the expiration date, whichever comes first. Do not store your pen with the needle attached. If the needle is left on, medicine may leak from the  pen. NOTE: This sheet is a summary. It may not cover all possible information. If you have questions about this medicine, talk to your doctor, pharmacist, or health care provider.  2015, Elsevier/Gold Standard. (2013-12-29 10:19:14)

## 2015-01-25 NOTE — Telephone Encounter (Signed)
Called pt, advised his A1C.

## 2015-01-26 ENCOUNTER — Telehealth: Payer: Self-pay

## 2015-01-26 NOTE — Telephone Encounter (Signed)
Pt was prescribed Victoza and he just found out that his insurance will not cover this and the out of pocket cost is $800. He wants to know if there is an alternative rx.

## 2015-01-26 NOTE — Telephone Encounter (Signed)
Dr Ouida Sills, you just saw pt for check up, but don'ts see thyroid addressed. Can we RF?

## 2015-01-27 MED ORDER — EXENATIDE 5 MCG/0.02ML ~~LOC~~ SOPN: 5.0000 ug | PEN_INJECTOR | Freq: Two times a day (BID) | SUBCUTANEOUS | Status: DC

## 2015-01-27 NOTE — Telephone Encounter (Signed)
We can try Byetta which is in the same class - the only difference is that it is an injection bid instead of daily.  If this is still to expensive have the patient let us know.

## 2015-01-27 NOTE — Telephone Encounter (Signed)
Spoke with pt. He will look at the cost of this medicine and also call his ins company to see what they will cover and he will call us back.

## 2015-01-29 ENCOUNTER — Telehealth: Payer: Self-pay

## 2015-01-29 NOTE — Telephone Encounter (Signed)
Pt can't afford either of these prescriptions Liraglutide (VICTOZA) 18 MG/3ML SOPN [[id number]] and exenatide (BYETTA 5 MCG PEN) 5 MCG/0.02ML SOPN injection [[id number]]. He would like a similar script that does the same thing. Please advise at [phone]

## 2015-01-29 NOTE — Telephone Encounter (Signed)
Insurance company needs to send over paperwork for him to take more than nine injections with in 21 days.   Please call patient   [phone]

## 2015-01-30 NOTE — Telephone Encounter (Signed)
Spoke with pt, he has to pay $247.00. He cannot afford this. Can we send in alternatives?

## 2015-01-31 MED ORDER — GLIMEPIRIDE 4 MG PO TABS: 4.0000 mg | ORAL_TABLET | Freq: Every day | ORAL | Status: DC

## 2015-01-31 NOTE — Addendum Note (Signed)
Addended by: Shelva Majestic A on: 01/31/2015 07:12 PM   Modules accepted: Level of Service

## 2015-01-31 NOTE — Telephone Encounter (Signed)
Sent prescription for glimeperide. Follow up in 3 months

## 2015-01-31 NOTE — Sleep Study (Signed)
NAME: Charles Bowman DATE OF BIRTH:  12/19/1960 MEDICAL RECORD NUMBER [id number]  LOCATION: [facility]  PHYSICIAN: KELLY,THOMAS A  DATE OF STUDY: 01/19/2015  SLEEP STUDY TYPE: Split Night/Positive Airway Pressure Titration               REFERRING PHYSICIAN: Pixie Casino, MD  INDICATION FOR STUDY:  Charles Bowman is a 54 year old gentleman who is referred by Dr. [name] for a split-night study.  The patient admits to loud snoring, nonrestorative sleep, witnessed apnea, and fatigue.  EPWORTH SLEEPINESS SCORE:  5 HEIGHT: 5\' 8"  (172.7 cm)  WEIGHT: 185 lb (83.915 kg)    Body mass index is 28.14 kg/(m^2).  NECK SIZE: 16.5 in.  MEDICATIONS:  SYNTHROID 175 MCG tablet Multiple Vitamins-Minerals (MULTIVITAMIN WITH MINERALS) tablet 1 tablet, Daily metFORMIN (GLUCOPHAGE-XR) 500 MG 24 hr tablet LORazepam (ATIVAN) 1 MG tablet lisinopril (PRINIVIL,ZESTRIL) 10 MG tablet 10 mg, Daily Liraglutide (VICTOZA) 18 MG/3ML SOPN 1.8 mg, Daily Lancets (ONETOUCH ULTRASOFT) lancets hydrochlorothiazide (MICROZIDE) 12.5 MG capsule glucose blood (ONE TOUCH ULTRA TEST) test strip glimepiride (AMARYL) 4 MG tablet 4 mg, Daily before breakfast fluticasone (FLONASE) 50 MCG/ACT nasal spray exenatide (BYETTA 5 MCG PEN) 5 MCG/0.02ML SOPN injection 5 mcg, 2 times daily before meals diltiazem (MATZIM LA) 420 MG 24 hr tablet 420 mg, Daily anagrelide (AGRYLIN) 1 MG capsule 3 mg, D   SLEEP ARCHITECTURE:  On the baseline portion of the study, the total recording time was 152 minutes, sleep time 121 minutes out of 127.5 minutes.  Percent.  Sleep efficiency was 79.6%.  Latency to sleep initiation was 24.5 minutes.  Latency to REM sleep was 60 minutes.  There were 39 arousals with an index of 19.3.  There was mild to moderate snoring. On the titration portion of the study, total recording time was 232 minutes, sleep time 173.5 minutes, and sleep.  Time to 29.5 minutes, giving a sleep efficiency of 74.8%.   Latency to REM sleep was 87.5 minutes.  There were 25 arousals with an index of 8.6.  Snoring resolved with CPAP therapy.  RESPIRATORY DATA:  On the diagnostic study, there was one obstructive apnea during REM sleep and 4 obstructive apneas with non-REM sleep.  There were 19  hypopneasis with REM sleep and 50 during non-REM sleep.  The AHI was 36.7/hr overall, but during REM sleep, the AHI was 68.6/hr. .  This is compatible with severe obstructive sleep apnea.  There was a significant supine positional component with all events occurring with supine sleep.  CPAP was initiated at 5 cm water pressure and was titrated up to 17 cm water pressure.  The AHI at 17 cm water pressure was 0, but the patient only slept for 9 minutes ( all REM supine sleep) at this pressure.  OXYGEN DATA:  The baseline oxygen saturation was 97%.  The lowest oxygen saturation was 55% during REM sleep and 71% during non-REM sleep on the diagnostic study, and 78% on a CPAP pressure 10 cm.   CARDIAC DATA:  The patient was in sinus rhythm with an average heart rate of 66 bpm. There was a rare PVC.  MOVEMENT/PARASOMNIA:  On the diagnostic study, there were 26 periodic limb movements with an index of 12.9.  With titration, there were 20 limb movements with an index of 6.9.  IMPRESSION/ RECOMMENDATION:   Severe obstructive sleep apnea/hypopnea syndrome.  Events were worse in the supine position and during REM sleep. Mild/moderate snoring, which resolved with CPAP therapy.  Respiratory events with frequent oxygen desaturation to a nadir of 55% during REM sleep. Abnormal sleep architecture related to respiratory events.  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   The arousal index was abnormal.   Recommend initial use of CPAP with C-Flex/EPR of 3 at 17 cm water pressure with heated tubing and heated humidity.  The patient used a ResMed AirFit  F10  FF M, size small.  He tolerated CPAP well.  Recommend  download in 30 days and sleep clinic evaluation.   [place], American Board of Sleep Medicine  ELECTRONICALLY SIGNED ON:  01/31/2015, 6:50 PM [place] PH: [phone]   FX: [phone] [place]

## 2015-02-01 ENCOUNTER — Ambulatory Visit (INDEPENDENT_AMBULATORY_CARE_PROVIDER_SITE_OTHER): Payer: PRIVATE HEALTH INSURANCE | Admitting: Emergency Medicine

## 2015-02-01 VITALS — BP 100/58 | HR 75 | Temp 97.5°F | Resp 18 | Ht 68.5 in | Wt 181.6 lb

## 2015-02-01 DIAGNOSIS — I10 Essential (primary) hypertension: Secondary | ICD-10-CM | POA: Diagnosis not present

## 2015-02-01 DIAGNOSIS — K529 Noninfective gastroenteritis and colitis, unspecified: Secondary | ICD-10-CM | POA: Diagnosis not present

## 2015-02-01 DIAGNOSIS — E119 Type 2 diabetes mellitus without complications: Secondary | ICD-10-CM

## 2015-02-01 MED ORDER — LOPERAMIDE HCL 2 MG PO TABS: ORAL_TABLET | ORAL | Status: DC

## 2015-02-01 MED ORDER — ONDANSETRON 8 MG PO TBDP: 8.0000 mg | ORAL_TABLET | Freq: Three times a day (TID) | ORAL | Status: DC | PRN

## 2015-02-01 NOTE — Progress Notes (Signed)
[facility] [address] [place] [address] [phone]  Date:  02/01/2015   Name:  ABANOUB HANKEN   DOB:  November 25, 1960   MRN:  [id number]  PCP:  No PCP Per Patient    Chief Complaint: Diarrhea; Emesis; and Food Intolerance   History of Present Illness:  SAED HUDLOW is a 54 y.o. very pleasant male patient who presents with the following:  Patient has DM2 and was ordered victoza.  Insurance refused and put on glimepiride Now has diarrhea since Tuesday  The patient has no complaint of blood, mucous, or pus in her stools. Watery in nature Tuesday had nausea and vomiting.  Now resolved No fever or chills No improvement with over the counter medications or other home remedies.  Denies other complaint or health concern today.   Patient Active Problem List   Diagnosis Date Noted  . Murmur 11/09/2014  . PVC's (premature ventricular contractions) 11/09/2014  . Diabetes mellitus type 2, uncomplicated 49/82/6415  . Anxiety 05/11/2014  . 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 MYELOPROLIFERATIVE DISORDER 09/06/2008  . HYPOTHYROIDISM 09/06/2008  . Anemia 09/06/2008  . Essential hypertension 09/06/2008    Past Medical History  Diagnosis Date  . Anemia   . Diabetes mellitus without complication   . Hypertension   . Thyroid disease   . Cancer   . Anxiety 05/11/2014  . Murmur 11/09/2014    Past Surgical History  Procedure Laterality Date  . Bone marrow biopsy    . Bronchoscopy      History  Substance Use Topics  . Smoking status: Former Smoker -- 1.50 packs/day for 20 years    Types: Cigarettes    Quit date: 09/02/2008  . Smokeless tobacco: Never Used  . Alcohol Use: No    Family History  Problem Relation Age of Onset  . Cancer Father     prostate ca  . Dementia Father 13    2008  . COPD Mother     +Tobacco Use    No Known Allergies  Medication list has been reviewed and updated.  Current Outpatient Prescriptions on File Prior to Visit   Medication Sig Dispense Refill  . anagrelide (AGRYLIN) 1 MG capsule Take 3 capsules (3 mg total) by mouth daily. 270 capsule 3  . diltiazem (MATZIM LA) 420 MG 24 hr tablet Take 1 tablet (420 mg total) by mouth daily. 30 tablet 12  . fluticasone (FLONASE) 50 MCG/ACT nasal spray PLACE 2 SPRAYS INTO BOTH NOSTRILS DAILY. 16 g 12  . glucose blood (ONE TOUCH ULTRA TEST) test strip Use as instructed 100 each 0  . hydrochlorothiazide (MICROZIDE) 12.5 MG capsule TAKE ONE CAPSULE BY MOUTH EVERY DAY 30 capsule 12  . Lancets (ONETOUCH ULTRASOFT) lancets Use as instructed 100 each 0  . lisinopril (PRINIVIL,ZESTRIL) 10 MG tablet Take 1 tablet (10 mg total) by mouth daily. 90 tablet 3  . LORazepam (ATIVAN) 1 MG tablet TAKE 1 TABLET BY MOUTH 3 TIMES A DAY AS NEEDED FOR ANXIETY 30 tablet 2  . metFORMIN (GLUCOPHAGE-XR) 500 MG 24 hr tablet TAKE 4 TABLETS BY MOUTH EVERY DAY 120 tablet 5  . Multiple Vitamins-Minerals (MULTIVITAMIN WITH MINERALS) tablet Take 1 tablet by mouth daily.    Marland Kitchen SYNTHROID 175 MCG tablet TAKE 1 TABLET (175 MCG TOTAL) BY MOUTH DAILY. 30 tablet 5  . exenatide (BYETTA 5 MCG PEN) 5 MCG/0.02ML SOPN injection Inject 0.02 mLs (5 mcg total) into the skin 2 (two) times daily before a meal. (Patient  not taking: Reported on 02/01/2015) 1.2 mL 2  . glimepiride (AMARYL) 4 MG tablet Take 1 tablet (4 mg total) by mouth daily before breakfast. 30 tablet 3  . Liraglutide (VICTOZA) 18 MG/3ML SOPN Inject 0.3 mLs (1.8 mg total) into the skin daily. Dispense 100 pen needles refill prn (Patient not taking: Reported on 02/01/2015) 18 mL 5   No current facility-administered medications on file prior to visit.    Review of Systems:  As per HPI, otherwise negative.    Physical Examination: Filed Vitals:   02/01/15 1136  BP: 100/58  Pulse: 75  Temp: 97.5 F (36.4 C)  Resp: 18   Filed Vitals:   02/01/15 1136  Height: 5' 8.5" (1.74 m)  Weight: 181 lb 9.6 oz (82.373 kg)   Body mass index is 27.21  kg/(m^2). Ideal Body Weight: Weight in (lb) to have BMI = 25: 166.5  GEN: WDWN, NAD, Non-toxic, A & O x 3 HEENT: Atraumatic, Normocephalic. Neck supple. No masses, No LAD. Ears and Nose: No external deformity. CV: RRR, No M/G/R. No JVD. No thrill. No extra heart sounds. PULM: CTA B, no wheezes, crackles, rhonchi. No retractions. No resp. distress. No accessory muscle use. ABD: S, NT, ND, +BS. No rebound. No HSM. EXTR: No c/c/e NEURO Normal gait.  PSYCH: Normally interactive. Conversant. Not depressed or anxious appearing.  Calm demeanor.    Assessment and Plan: Gastroenteritis Imodium zofran Clears  Signed,  Ellison Carwin, MD

## 2015-02-01 NOTE — Patient Instructions (Signed)
Clear Liquid Diet A clear liquid diet is a short-term diet that is prescribed to provide the necessary fluid and basic energy you need when you can have nothing else. The clear liquid diet consists of liquids or solids that will become liquid at room temperature. You should be able to see through the liquid. There are many reasons that you may be restricted to clear liquids, such as:  When you have a sudden-onset (acute) condition that occurs before or after surgery.  To help your body slowly get adjusted to food again after a long period when you were unable to have food.  Replacement of fluids when you have a diarrheal disease.  When you are going to have certain exams, such as a colonoscopy, in which instruments are inserted inside your body to look at parts of your digestive system. WHAT CAN I HAVE? A clear liquid diet does not provide all the nutrients you need. It is important to choose a variety of the following items to get as many nutrients as possible:  Vegetable juices that do not have pulp.  Fruit juices and fruit drinks that do not have pulp.  Coffee (regular or decaffeinated), tea, or soda at the discretion of your health care provider.  Clear bouillon, broth, or strained broth-based soups.  High-protein and flavored gelatins.  Sugar or honey.  Ices or frozen ice pops that do not contain milk. If you are not sure whether you can have certain items, you should ask your health care provider. You may also ask your health care provider if there are any other clear liquid options. Document Released: 10/20/2005 Document Revised: 10/25/2013 Document Reviewed: 09/16/2013 ExitCare Patient Information 2015 ExitCare[employer]. This information is not intended to replace advice given to you by your health care provider. Make sure you discuss any questions you have with your health care provider. Viral Gastroenteritis Viral gastroenteritis is also known as stomach flu. This condition  affects the stomach and intestinal tract. It can cause sudden diarrhea and vomiting. The illness typically lasts 3 to 8 days. Most people develop an immune response that eventually gets rid of the virus. While this natural response develops, the virus can make you quite ill. CAUSES  Many different viruses can cause gastroenteritis, such as rotavirus or noroviruses. You can catch one of these viruses by consuming contaminated food or water. You may also catch a virus by sharing utensils or other personal items with an infected person or by touching a contaminated surface. SYMPTOMS  The most common symptoms are diarrhea and vomiting. These problems can cause a severe loss of body fluids (dehydration) and a body salt (electrolyte) imbalance. Other symptoms may include:  Fever.  Headache.  Fatigue.  Abdominal pain. DIAGNOSIS  Your caregiver can usually diagnose viral gastroenteritis based on your symptoms and a physical exam. A stool sample may also be taken to test for the presence of viruses or other infections. TREATMENT  This illness typically goes away on its own. Treatments are aimed at rehydration. The most serious cases of viral gastroenteritis involve vomiting so severely that you are not able to keep fluids down. In these cases, fluids must be given through an intravenous line (IV). HOME CARE INSTRUCTIONS   Drink enough fluids to keep your urine clear or pale yellow. Drink small amounts of fluids frequently and increase the amounts as tolerated.  Ask your caregiver for specific rehydration instructions.  Avoid:  Foods high in sugar.  Alcohol.  Carbonated drinks.  Tobacco.  Juice.    Caffeine drinks.  Extremely hot or cold fluids.  Fatty, greasy foods.  Too much intake of anything at one time.  Dairy products until 24 to 48 hours after diarrhea stops.  You may consume probiotics. Probiotics are active cultures of beneficial bacteria. They may lessen the amount and  number of diarrheal stools in adults. Probiotics can be found in yogurt with active cultures and in supplements.  Wash your hands well to avoid spreading the virus.  Only take over-the-counter or prescription medicines for pain, discomfort, or fever as directed by your caregiver. Do not give aspirin to children. Antidiarrheal medicines are not recommended.  Ask your caregiver if you should continue to take your regular prescribed and over-the-counter medicines.  Keep all follow-up appointments as directed by your caregiver. SEEK IMMEDIATE MEDICAL CARE IF:   You are unable to keep fluids down.  You do not urinate at least once every 6 to 8 hours.  You develop shortness of breath.  You notice blood in your stool or vomit. This may look like coffee grounds.  You have abdominal pain that increases or is concentrated in one small area (localized).  You have persistent vomiting or diarrhea.  You have a fever.  The patient is a child younger than 3 months, and he or she has a fever.  The patient is a child older than 3 months, and he or she has a fever and persistent symptoms.  The patient is a child older than 3 months, and he or she has a fever and symptoms suddenly get worse.  The patient is a baby, and he or she has no tears when crying. MAKE SURE YOU:   Understand these instructions.  Will watch your condition.  Will get help right away if you are not doing well or get worse. Document Released: 10/20/2005 Document Revised: 01/12/2012 Document Reviewed: 08/06/2011 ExitCare Patient Information 2015 [employer]Care[employer]. This information is not intended to replace advice given to you by your health care provider. Make sure you discuss any questions you have with your health care provider.  

## 2015-02-01 NOTE — Telephone Encounter (Signed)
Pt in the office today, Charles Bowman relayed message for me.

## 2015-02-02 ENCOUNTER — Telehealth: Payer: Self-pay | Admitting: *Deleted

## 2015-02-02 NOTE — Telephone Encounter (Signed)
Left message sleep study positive for sleep apnea. Referral done and sent to [facility] for CPAP set up. They will contact him once benefits has been verified and approved. Call back if questions.

## 2015-02-02 NOTE — Telephone Encounter (Signed)
Faxed order to [facility] for CPAP set up  and supplies.

## 2015-02-16 ENCOUNTER — Telehealth: Payer: Self-pay | Admitting: *Deleted

## 2015-02-16 NOTE — Telephone Encounter (Signed)
Left message with sleep study results and recommendations. Referral sent to [facility] for set up and supplies.

## 2015-02-18 ENCOUNTER — Other Ambulatory Visit: Payer: Self-pay | Admitting: Physician Assistant

## 2015-03-15 ENCOUNTER — Other Ambulatory Visit: Payer: Self-pay | Admitting: Hematology and Oncology

## 2015-04-25 ENCOUNTER — Other Ambulatory Visit: Payer: Self-pay | Admitting: Emergency Medicine

## 2015-05-08 ENCOUNTER — Other Ambulatory Visit: Payer: Self-pay | Admitting: Emergency Medicine

## 2015-05-14 ENCOUNTER — Telehealth: Payer: Self-pay | Admitting: Hematology and Oncology

## 2015-05-14 NOTE — Telephone Encounter (Signed)
pt called to r/s appt...done....pt ok and aware of new d.t °

## 2015-05-15 ENCOUNTER — Ambulatory Visit: Payer: PRIVATE HEALTH INSURANCE | Admitting: Hematology and Oncology

## 2015-05-15 ENCOUNTER — Other Ambulatory Visit: Payer: PRIVATE HEALTH INSURANCE

## 2015-05-29 ENCOUNTER — Ambulatory Visit (HOSPITAL_BASED_OUTPATIENT_CLINIC_OR_DEPARTMENT_OTHER): Payer: PRIVATE HEALTH INSURANCE | Admitting: Hematology and Oncology

## 2015-05-29 ENCOUNTER — Telehealth: Payer: Self-pay | Admitting: Hematology and Oncology

## 2015-05-29 ENCOUNTER — Other Ambulatory Visit (HOSPITAL_BASED_OUTPATIENT_CLINIC_OR_DEPARTMENT_OTHER): Payer: PRIVATE HEALTH INSURANCE

## 2015-05-29 ENCOUNTER — Encounter: Payer: Self-pay | Admitting: Hematology and Oncology

## 2015-05-29 VITALS — BP 159/70 | HR 65 | Temp 97.8°F | Resp 18 | Ht 68.5 in | Wt 195.0 lb

## 2015-05-29 DIAGNOSIS — D473 Essential (hemorrhagic) thrombocythemia: Secondary | ICD-10-CM

## 2015-05-29 DIAGNOSIS — I10 Essential (primary) hypertension: Secondary | ICD-10-CM | POA: Diagnosis not present

## 2015-05-29 DIAGNOSIS — D649 Anemia, unspecified: Secondary | ICD-10-CM

## 2015-05-29 DIAGNOSIS — D638 Anemia in other chronic diseases classified elsewhere: Secondary | ICD-10-CM

## 2015-05-29 LAB — CBC WITH DIFFERENTIAL/PLATELET
BASO%: 0.3 % (ref 0.0–2.0)
BASOS ABS: 0 10*3/uL (ref 0.0–0.1)
EOS%: 1.5 % (ref 0.0–7.0)
Eosinophils Absolute: 0.1 10*3/uL (ref 0.0–0.5)
HEMATOCRIT: 37.5 % — AB (ref 38.4–49.9)
HGB: 12.4 g/dL — ABNORMAL LOW (ref 13.0–17.1)
LYMPH%: 19.7 % (ref 14.0–49.0)
MCH: 26.8 pg — ABNORMAL LOW (ref 27.2–33.4)
MCHC: 33.1 g/dL (ref 32.0–36.0)
MCV: 81.1 fL (ref 79.3–98.0)
MONO#: 0.4 10*3/uL (ref 0.1–0.9)
MONO%: 7.2 % (ref 0.0–14.0)
NEUT%: 71.3 % (ref 39.0–75.0)
NEUTROS ABS: 3.5 10*3/uL (ref 1.5–6.5)
Platelets: 211 10*3/uL (ref 140–400)
RBC: 4.62 10*6/uL (ref 4.20–5.82)
RDW: 17 % — ABNORMAL HIGH (ref 11.0–14.6)
WBC: 4.9 10*3/uL (ref 4.0–10.3)
lymph#: 1 10*3/uL (ref 0.9–3.3)

## 2015-05-29 MED ORDER — ANAGRELIDE HCL 1 MG PO CAPS: 3.0000 mg | ORAL_CAPSULE | Freq: Every day | ORAL | Status: DC

## 2015-05-29 NOTE — Telephone Encounter (Signed)
Gave and printed appt sched and avs for pt for Jan 2017 °

## 2015-05-30 NOTE — Assessment & Plan Note (Signed)
This is likely anemia of chronic disease. The patient denies recent history of bleeding such as epistaxis, hematuria or hematochezia. He is asymptomatic from the anemia. We will observe for now.  

## 2015-05-30 NOTE — Assessment & Plan Note (Signed)
He tolerated treatment well. I recommend continue the same

## 2015-05-30 NOTE — Assessment & Plan Note (Signed)
he will continue current medical management. I recommend close follow-up with primary care doctor for medication adjustment.  

## 2015-05-30 NOTE — Progress Notes (Signed)
[place] OFFICE PROGRESS NOTE  Patient Care Team: No Pcp Per Patient as PCP - General (General Practice) Heath Lark, MD as Consulting Physician (Hematology and Oncology)  SUMMARY OF ONCOLOGIC HISTORY:   THROMBOCYTHEMIA   11/03/1984 Cancer Diagnosis He was noted to have significant abnormal CBC   05/24/2003 Bone Marrow Biopsy Case #: [id number] biopsy confirmed persistent thrombocythemia   11/06/2003 -  Chemotherapy He was placed on anagrelide    INTERVAL HISTORY: Please see below for problem oriented charting. He feels well. Denies recent infection The patient denies any recent signs or symptoms of bleeding such as spontaneous epistaxis, hematuria or hematochezia.   REVIEW OF SYSTEMS:   Constitutional: Denies fevers, chills or abnormal weight loss Eyes: Denies blurriness of vision Ears, nose, mouth, throat, and face: Denies mucositis or sore throat Respiratory: Denies cough, dyspnea or wheezes Cardiovascular: Denies palpitation, chest discomfort or lower extremity swelling Gastrointestinal:  Denies nausea, heartburn or change in bowel habits Skin: Denies abnormal skin rashes Lymphatics: Denies new lymphadenopathy or easy bruising Neurological:Denies numbness, tingling or new weaknesses Behavioral/Psych: Mood is stable, no new changes  All other systems were reviewed with the patient and are negative.  I have reviewed the past medical history, past surgical history, social history and family history with the patient and they are unchanged from previous note.  ALLERGIES:  has No Known Allergies.  MEDICATIONS:  Current Outpatient Prescriptions  Medication Sig Dispense Refill  . anagrelide (AGRYLIN) 1 MG capsule Take 3 capsules (3 mg total) by mouth daily. 90 capsule 6  . aspirin 81 MG tablet Take 81 mg by mouth daily.    Marland Kitchen atorvastatin (LIPITOR) 20 MG tablet TAKE 1 TABLET (20 MG TOTAL) BY MOUTH DAILY.  1  . diltiazem (MATZIM LA) 420 MG 24 hr tablet Take 1 tablet  (420 mg total) by mouth daily. 30 tablet 12  . fluticasone (FLONASE) 50 MCG/ACT nasal spray PLACE 2 SPRAYS INTO BOTH NOSTRILS DAILY. 16 g 12  . glucose blood (ONE TOUCH ULTRA TEST) test strip Use as instructed 100 each 0  . hydrochlorothiazide (MICROZIDE) 12.5 MG capsule TAKE ONE CAPSULE BY MOUTH EVERY DAY 30 capsule 12  . Lancets (ONETOUCH ULTRASOFT) lancets Use as instructed 100 each 0  . lisinopril (PRINIVIL,ZESTRIL) 10 MG tablet Take 1 tablet (10 mg total) by mouth daily. 90 tablet 3  . LORazepam (ATIVAN) 1 MG tablet TAKE 1 TABLET BY MOUTH 3 TIMES A DAY AS NEEDED FOR ANXIETY 30 tablet 2  . metFORMIN (GLUCOPHAGE-XR) 500 MG 24 hr tablet TAKE 4 TABLETS BY MOUTH EVERY DAY 120 tablet 5  . Multiple Vitamins-Minerals (MULTIVITAMIN WITH MINERALS) tablet Take 1 tablet by mouth daily.    Marland Kitchen SYNTHROID 175 MCG tablet TAKE 1 TABLET (175 MCG TOTAL) BY MOUTH DAILY. 30 tablet 5  . glimepiride (AMARYL) 4 MG tablet Take 1 tablet (4 mg total) by mouth daily before breakfast. (Patient not taking: Reported on 05/29/2015) 30 tablet 3  . loperamide (IMODIUM A-D) 2 MG tablet 2 now and one hourly prn diarrhea.  Max 8 tabs in 24 hours (Patient not taking: Reported on 05/29/2015) 30 tablet 0  . ondansetron (ZOFRAN-ODT) 8 MG disintegrating tablet Take 1 tablet (8 mg total) by mouth every 8 (eight) hours as needed for nausea. (Patient not taking: Reported on 05/29/2015) 30 tablet 0   No current facility-administered medications for this visit.    PHYSICAL EXAMINATION: ECOG PERFORMANCE STATUS: 0 - Asymptomatic  Filed Vitals:   05/29/15 1455  BP:  159/70  Pulse: 65  Temp: 97.8 F (36.6 C)  Resp: 18   Filed Weights   05/29/15 1455  Weight: 195 lb (88.451 kg)    GENERAL:alert, no distress and comfortable SKIN: skin color, texture, turgor are normal, no rashes or significant lesions EYES: normal, Conjunctiva are pink and non-injected, sclera clear OROPHARYNX:no exudate, no erythema and lips, buccal mucosa, and  tongue normal  NECK: supple, thyroid normal size, non-tender, without nodularity LYMPH:  no palpable lymphadenopathy in the cervical, axillary or inguinal LUNGS: clear to auscultation and percussion with normal breathing effort HEART: regular rate & rhythm and no murmurs and no lower extremity edema ABDOMEN:abdomen soft, non-tender and normal bowel sounds Musculoskeletal:no cyanosis of digits and no clubbing  NEURO: alert & oriented x 3 with fluent speech, no focal motor/sensory deficits  LABORATORY DATA:  I have reviewed the data as listed    Component Value Date/Time   NA 138 01/25/2015 0943   NA 138 11/14/2014 1500   K 4.4 01/25/2015 0943   K 4.4 11/14/2014 1500   CL 102 01/25/2015 0943   CL 105 09/17/2012 1030   CO2 25 01/25/2015 0943   CO2 27 11/14/2014 1500   GLUCOSE 172* 01/25/2015 0943   GLUCOSE 106 11/14/2014 1500   GLUCOSE 140* 09/17/2012 1030   BUN 16 01/25/2015 0943   BUN 20.3 11/14/2014 1500   CREATININE 0.84 01/25/2015 0943   CREATININE 1.0 11/14/2014 1500   CREATININE 1.08 02/13/2012 1236   CALCIUM 9.5 01/25/2015 0943   CALCIUM 9.3 11/14/2014 1500   PROT 7.0 01/25/2015 0943   PROT 7.3 11/14/2014 1500   ALBUMIN 4.4 01/25/2015 0943   ALBUMIN 4.0 11/14/2014 1500   AST 19 01/25/2015 0943   AST 23 11/14/2014 1500   ALT 28 01/25/2015 0943   ALT 26 11/14/2014 1500   ALKPHOS 90 01/25/2015 0943   ALKPHOS 99 11/14/2014 1500   BILITOT 0.4 01/25/2015 0943   BILITOT 0.26 11/14/2014 1500   GFRNONAA >90 08/28/2011 1020   GFRAA >90 08/28/2011 1020    No results found for: SPEP, UPEP  Lab Results  Component Value Date   WBC 4.9 05/29/2015   NEUTROABS 3.5 05/29/2015   HGB 12.4* 05/29/2015   HCT 37.5* 05/29/2015   MCV 81.1 05/29/2015   PLT 211 05/29/2015      Chemistry      Component Value Date/Time   NA 138 01/25/2015 0943   NA 138 11/14/2014 1500   K 4.4 01/25/2015 0943   K 4.4 11/14/2014 1500   CL 102 01/25/2015 0943   CL 105 09/17/2012 1030   CO2 25  01/25/2015 0943   CO2 27 11/14/2014 1500   BUN 16 01/25/2015 0943   BUN 20.3 11/14/2014 1500   CREATININE 0.84 01/25/2015 0943   CREATININE 1.0 11/14/2014 1500   CREATININE 1.08 02/13/2012 1236      Component Value Date/Time   CALCIUM 9.5 01/25/2015 0943   CALCIUM 9.3 11/14/2014 1500   ALKPHOS 90 01/25/2015 0943   ALKPHOS 99 11/14/2014 1500   AST 19 01/25/2015 0943   AST 23 11/14/2014 1500   ALT 28 01/25/2015 0943   ALT 26 11/14/2014 1500   BILITOT 0.4 01/25/2015 0943   BILITOT 0.26 11/14/2014 1500       RADIOGRAPHIC STUDIES: I have personally reviewed the radiological images as listed and agreed with the findings in the report. No results found.   ASSESSMENT & PLAN:  THROMBOCYTHEMIA He tolerated treatment well. I recommend continue the same  Anemia in chronic illness This is likely anemia of chronic disease. The patient denies recent history of bleeding such as epistaxis, hematuria or hematochezia. He is asymptomatic from the anemia. We will observe for now.      Essential hypertension he will continue current medical management. I recommend close follow-up with primary care doctor for medication adjustment.    Orders Placed This Encounter  Procedures  . CBC with Differential/Platelet    Standing Status: Future     Number of Occurrences:      Standing Expiration Date: 07/02/2016   All questions were answered. The patient knows to call the clinic with any problems, questions or concerns. No barriers to learning was detected. I spent 15 minutes counseling the patient face to face. The total time spent in the appointment was 20 minutes and more than 50% was on counseling and review of test results     [facility], Tavon Corriher, MD 05/30/2015 8:55 PM

## 2015-06-03 ENCOUNTER — Other Ambulatory Visit: Payer: Self-pay | Admitting: Emergency Medicine

## 2015-06-17 ENCOUNTER — Other Ambulatory Visit: Payer: Self-pay | Admitting: Hematology and Oncology

## 2015-07-20 ENCOUNTER — Other Ambulatory Visit: Payer: Self-pay | Admitting: Emergency Medicine

## 2015-07-31 ENCOUNTER — Other Ambulatory Visit: Payer: Self-pay | Admitting: Emergency Medicine

## 2015-08-03 ENCOUNTER — Other Ambulatory Visit: Payer: Self-pay | Admitting: Emergency Medicine

## 2015-08-21 ENCOUNTER — Other Ambulatory Visit: Payer: Self-pay | Admitting: Emergency Medicine

## 2015-08-30 ENCOUNTER — Other Ambulatory Visit: Payer: Self-pay | Admitting: Emergency Medicine

## 2015-09-12 ENCOUNTER — Other Ambulatory Visit: Payer: Self-pay | Admitting: Emergency Medicine

## 2015-09-16 ENCOUNTER — Other Ambulatory Visit: Payer: Self-pay | Admitting: Physician Assistant

## 2015-09-17 ENCOUNTER — Other Ambulatory Visit: Payer: Self-pay | Admitting: Urgent Care

## 2015-09-17 ENCOUNTER — Other Ambulatory Visit: Payer: Self-pay | Admitting: Emergency Medicine

## 2015-10-15 ENCOUNTER — Other Ambulatory Visit: Payer: Self-pay | Admitting: Emergency Medicine

## 2015-11-11 ENCOUNTER — Telehealth: Payer: Self-pay | Admitting: Hematology and Oncology

## 2015-11-11 NOTE — Telephone Encounter (Signed)
DUE TO CALL MOVED 1/20 LAB/FU TO AM. LEFT MESSAGE FOR PATIENT.

## 2015-11-16 MED FILL — ANAGRELIDE HCL 1 MG CAPSULE: 1 | 30 days supply | Qty: 90 | Fill #5

## 2015-11-21 ENCOUNTER — Telehealth: Payer: Self-pay | Admitting: Hematology and Oncology

## 2015-11-21 NOTE — Telephone Encounter (Signed)
pt cld to r/s appt-gave pt copy of avs

## 2015-11-23 ENCOUNTER — Other Ambulatory Visit: Payer: PRIVATE HEALTH INSURANCE

## 2015-11-23 ENCOUNTER — Ambulatory Visit: Payer: PRIVATE HEALTH INSURANCE | Admitting: Hematology and Oncology

## 2015-12-03 ENCOUNTER — Encounter: Payer: Self-pay | Admitting: Hematology and Oncology

## 2015-12-03 ENCOUNTER — Other Ambulatory Visit (HOSPITAL_BASED_OUTPATIENT_CLINIC_OR_DEPARTMENT_OTHER): Payer: PRIVATE HEALTH INSURANCE

## 2015-12-03 ENCOUNTER — Ambulatory Visit (HOSPITAL_BASED_OUTPATIENT_CLINIC_OR_DEPARTMENT_OTHER): Payer: PRIVATE HEALTH INSURANCE | Admitting: Hematology and Oncology

## 2015-12-03 VITALS — BP 159/71 | HR 95 | Temp 98.1°F | Resp 17 | Ht 68.5 in | Wt 193.6 lb

## 2015-12-03 DIAGNOSIS — D473 Essential (hemorrhagic) thrombocythemia: Secondary | ICD-10-CM

## 2015-12-03 DIAGNOSIS — I10 Essential (primary) hypertension: Secondary | ICD-10-CM

## 2015-12-03 LAB — CBC WITH DIFFERENTIAL/PLATELET
BASO%: 0.4 % (ref 0.0–2.0)
BASOS ABS: 0 10*3/uL (ref 0.0–0.1)
EOS%: 2.2 % (ref 0.0–7.0)
Eosinophils Absolute: 0.1 10*3/uL (ref 0.0–0.5)
HCT: 40.3 % (ref 38.4–49.9)
HGB: 13.1 g/dL (ref 13.0–17.1)
LYMPH%: 19.4 % (ref 14.0–49.0)
MCH: 26.5 pg — AB (ref 27.2–33.4)
MCHC: 32.6 g/dL (ref 32.0–36.0)
MCV: 81.1 fL (ref 79.3–98.0)
MONO#: 0.3 10*3/uL (ref 0.1–0.9)
MONO%: 6.4 % (ref 0.0–14.0)
NEUT#: 3.7 10*3/uL (ref 1.5–6.5)
NEUT%: 71.6 % (ref 39.0–75.0)
PLATELETS: 197 10*3/uL (ref 140–400)
RBC: 4.97 10*6/uL (ref 4.20–5.82)
RDW: 16.9 % — AB (ref 11.0–14.6)
WBC: 5.2 10*3/uL (ref 4.0–10.3)
lymph#: 1 10*3/uL (ref 0.9–3.3)

## 2015-12-03 MED ORDER — ANAGRELIDE HCL 1 MG PO CAPS: 3.0000 mg | ORAL_CAPSULE | Freq: Every day | ORAL | Status: DC

## 2015-12-03 NOTE — Progress Notes (Signed)
[place] OFFICE PROGRESS NOTE  Patient Care Team: [name], DO as PCP - General (Family Medicine) Heath Lark, MD as Consulting Physician (Hematology and Oncology)  SUMMARY OF ONCOLOGIC HISTORY:   Essential thrombocythemia ([place])   11/03/1984 Cancer Diagnosis He was noted to have significant abnormal CBC   05/24/2003 Bone Marrow Biopsy Case #: [id number] biopsy confirmed essential thrombocythemia   11/06/2003 -  Chemotherapy He was placed on anagrelide    INTERVAL HISTORY: Please see below for problem oriented charting. He feels well. No recent bleeding or blood clots. He is compliant taking his medications. No recent infection.  REVIEW OF SYSTEMS:   Constitutional: Denies fevers, chills or abnormal weight loss Eyes: Denies blurriness of vision Ears, nose, mouth, throat, and face: Denies mucositis or sore throat Respiratory: Denies cough, dyspnea or wheezes Cardiovascular: Denies palpitation, chest discomfort or lower extremity swelling Gastrointestinal:  Denies nausea, heartburn or change in bowel habits Skin: Denies abnormal skin rashes Lymphatics: Denies new lymphadenopathy or easy bruising Neurological:Denies numbness, tingling or new weaknesses Behavioral/Psych: Mood is stable, no new changes  All other systems were reviewed with the patient and are negative.  I have reviewed the past medical history, past surgical history, social history and family history with the patient and they are unchanged from previous note.  ALLERGIES:  has No Known Allergies.  MEDICATIONS:  Current Outpatient Prescriptions  Medication Sig Dispense Refill  . anagrelide (AGRYLIN) 1 MG capsule Take 3 capsules (3 mg total) by mouth daily. 90 capsule 6  . anagrelide (AGRYLIN) 1 MG capsule Take 3 capsules (3 mg total) by mouth daily. 90 capsule 6  . aspirin 81 MG tablet Take 81 mg by mouth daily.    Marland Kitchen atorvastatin (LIPITOR) 20 MG tablet Take 1 tablet (20 mg total) by mouth daily.  NO MORE REFILLS WITHOUT OFFICE VISIT/ LABS - 2ND NOTICE 15 tablet 0  . diltiazem (MATZIM LA) 420 MG 24 hr tablet Take 1 tablet (420 mg total) by mouth daily. 30 tablet 12  . fluticasone (FLONASE) 50 MCG/ACT nasal spray PLACE 2 SPRAYS IN EACH NOSTRIL EVERY DAY 16 g 1  . glimepiride (AMARYL) 4 MG tablet TAKE 1 TABLET (4 MG TOTAL) BY MOUTH DAILY BEFORE BREAKFAST.  "NO MORE REFILLS WITHOUT OV" (Patient taking differently: 2 mg. TAKE 1 TABLET (4 MG TOTAL) BY MOUTH DAILY BEFORE BREAKFAST.  "NO MORE REFILLS WITHOUT OV") 30 tablet 0  . glucose blood (ONE TOUCH ULTRA TEST) test strip Use as instructed 100 each 0  . hydrochlorothiazide (MICROZIDE) 12.5 MG capsule TAKE ONE CAPSULE BY MOUTH EVERY DAY 30 capsule 12  . Lancets (ONETOUCH ULTRASOFT) lancets Use as instructed 100 each 0  . lisinopril (PRINIVIL,ZESTRIL) 10 MG tablet Take 1 tablet (10 mg total) by mouth daily. 90 tablet 3  . LORazepam (ATIVAN) 1 MG tablet TAKE 1 TABLET BY MOUTH 3 TIMES A DAY AS NEEDED FOR ANXIETY 30 tablet 2  . metFORMIN (GLUCOPHAGE-XR) 500 MG 24 hr tablet TAKE 4 TABLETS BY MOUTH EVERY DAY  "OFFICE VISIT NEEDED" 60 tablet 0  . Multiple Vitamins-Minerals (MULTIVITAMIN WITH MINERALS) tablet Take 1 tablet by mouth daily.    Marland Kitchen SYNTHROID 175 MCG tablet TAKE 1 TABLET (175 MCG TOTAL) BY MOUTH DAILY  "NO MORE REFILLS WITHOUT OFFICE VISIT" 15 tablet 0   No current facility-administered medications for this visit.    PHYSICAL EXAMINATION: ECOG PERFORMANCE STATUS: 0 - Asymptomatic  Filed Vitals:   12/03/15 1352  BP: 159/71  Pulse: 95  Temp: 98.1 F (36.7 C)  Resp: 17   Filed Weights   12/03/15 1352  Weight: 193 lb 9.6 oz (87.816 kg)    GENERAL:alert, no distress and comfortable SKIN: skin color, texture, turgor are normal, no rashes or significant lesions EYES: normal, Conjunctiva are pink and non-injected, sclera clear OROPHARYNX:no exudate, no erythema and lips, buccal mucosa, and tongue normal  NECK: supple, thyroid normal  size, non-tender, without nodularity LYMPH:  no palpable lymphadenopathy in the cervical, axillary or inguinal LUNGS: clear to auscultation and percussion with normal breathing effort HEART: regular rate & rhythm and no murmurs and no lower extremity edema ABDOMEN:abdomen soft, non-tender and normal bowel sounds Musculoskeletal:no cyanosis of digits and no clubbing  NEURO: alert & oriented x 3 with fluent speech, no focal motor/sensory deficits  LABORATORY DATA:  I have reviewed the data as listed    Component Value Date/Time   NA 138 01/25/2015 0943   NA 138 11/14/2014 1500   K 4.4 01/25/2015 0943   K 4.4 11/14/2014 1500   CL 102 01/25/2015 0943   CL 105 09/17/2012 1030   CO2 25 01/25/2015 0943   CO2 27 11/14/2014 1500   GLUCOSE 172* 01/25/2015 0943   GLUCOSE 106 11/14/2014 1500   GLUCOSE 140* 09/17/2012 1030   BUN 16 01/25/2015 0943   BUN 20.3 11/14/2014 1500   CREATININE 0.84 01/25/2015 0943   CREATININE 1.0 11/14/2014 1500   CREATININE 1.08 02/13/2012 1236   CALCIUM 9.5 01/25/2015 0943   CALCIUM 9.3 11/14/2014 1500   PROT 7.0 01/25/2015 0943   PROT 7.3 11/14/2014 1500   ALBUMIN 4.4 01/25/2015 0943   ALBUMIN 4.0 11/14/2014 1500   AST 19 01/25/2015 0943   AST 23 11/14/2014 1500   ALT 28 01/25/2015 0943   ALT 26 11/14/2014 1500   ALKPHOS 90 01/25/2015 0943   ALKPHOS 99 11/14/2014 1500   BILITOT 0.4 01/25/2015 0943   BILITOT 0.26 11/14/2014 1500   GFRNONAA >90 08/28/2011 1020   GFRAA >90 08/28/2011 1020    No results found for: SPEP, UPEP  Lab Results  Component Value Date   WBC 5.2 12/03/2015   NEUTROABS 3.7 12/03/2015   HGB 13.1 12/03/2015   HCT 40.3 12/03/2015   MCV 81.1 12/03/2015   PLT 197 12/03/2015      Chemistry      Component Value Date/Time   NA 138 01/25/2015 0943   NA 138 11/14/2014 1500   K 4.4 01/25/2015 0943   K 4.4 11/14/2014 1500   CL 102 01/25/2015 0943   CL 105 09/17/2012 1030   CO2 25 01/25/2015 0943   CO2 27 11/14/2014 1500    BUN 16 01/25/2015 0943   BUN 20.3 11/14/2014 1500   CREATININE 0.84 01/25/2015 0943   CREATININE 1.0 11/14/2014 1500   CREATININE 1.08 02/13/2012 1236      Component Value Date/Time   CALCIUM 9.5 01/25/2015 0943   CALCIUM 9.3 11/14/2014 1500   ALKPHOS 90 01/25/2015 0943   ALKPHOS 99 11/14/2014 1500   AST 19 01/25/2015 0943   AST 23 11/14/2014 1500   ALT 28 01/25/2015 0943   ALT 26 11/14/2014 1500   BILITOT 0.4 01/25/2015 0943   BILITOT 0.26 11/14/2014 1500       ASSESSMENT & PLAN:  Essential thrombocythemia He tolerated treatment well. I recommend continue the same    Essential hypertension he will continue current medical management. The patient said his BP is usually OK, wondering about white coat hypertension I recommend close  follow-up with primary care doctor for medication adjustment.      Orders Placed This Encounter  Procedures  . CBC with Differential/Platelet    Standing Status: Future     Number of Occurrences:      Standing Expiration Date: 01/06/2017   All questions were answered. The patient knows to call the clinic with any problems, questions or concerns. No barriers to learning was detected. I spent 15 minutes counseling the patient face to face. The total time spent in the appointment was 20 minutes and more than 50% was on counseling and review of test results     [facility], Kymoni Lesperance, MD 12/03/2015 4:05 PM

## 2015-12-03 NOTE — Assessment & Plan Note (Signed)
he will continue current medical management. The patient said his BP is usually OK, wondering about white coat hypertension I recommend close follow-up with primary care doctor for medication adjustment.

## 2015-12-03 NOTE — Assessment & Plan Note (Signed)
He tolerated treatment well. I recommend continue the same   

## 2015-12-04 ENCOUNTER — Telehealth: Payer: Self-pay | Admitting: Hematology and Oncology

## 2015-12-04 NOTE — Telephone Encounter (Signed)
Left message for patient about appointment date/time per 1/30 pof for July 21 with Dr. [name].

## 2015-12-19 MED FILL — ANAGRELIDE HCL 1 MG CAPSULE: 1 | 30 days supply | Qty: 90 | Fill #6

## 2016-01-21 MED FILL — ANAGRELIDE HCL 1 MG CAPSULE: 1 | 30 days supply | Qty: 90 | Fill #0

## 2016-02-14 ENCOUNTER — Other Ambulatory Visit: Payer: Self-pay

## 2016-02-14 DIAGNOSIS — I10 Essential (primary) hypertension: Secondary | ICD-10-CM

## 2016-02-14 MED ORDER — DILTIAZEM HCL ER COATED BEADS 420 MG PO TB24: 420.0000 mg | ORAL_TABLET | Freq: Every day | ORAL | Status: DC

## 2016-02-14 MED ORDER — HYDROCHLOROTHIAZIDE 12.5 MG PO CAPS: ORAL_CAPSULE | ORAL | Status: DC

## 2016-02-18 MED FILL — ANAGRELIDE HCL 1 MG CAPSULE: 1 | 30 days supply | Qty: 90 | Fill #1

## 2016-03-16 ENCOUNTER — Other Ambulatory Visit: Payer: Self-pay | Admitting: Urgent Care

## 2016-03-20 MED FILL — ANAGRELIDE HCL 1 MG CAPSULE: 1 | 30 days supply | Qty: 90 | Fill #2

## 2016-04-18 MED FILL — ANAGRELIDE HCL 1 MG CAPSULE: 1 | 30 days supply | Qty: 90 | Fill #3

## 2016-05-19 MED FILL — ANAGRELIDE HCL 1 MG CAPSULE: 1 | 30 days supply | Qty: 90 | Fill #4

## 2016-05-23 ENCOUNTER — Encounter: Payer: Self-pay | Admitting: Hematology and Oncology

## 2016-05-23 ENCOUNTER — Other Ambulatory Visit (HOSPITAL_BASED_OUTPATIENT_CLINIC_OR_DEPARTMENT_OTHER): Payer: PRIVATE HEALTH INSURANCE

## 2016-05-23 ENCOUNTER — Ambulatory Visit (HOSPITAL_BASED_OUTPATIENT_CLINIC_OR_DEPARTMENT_OTHER): Payer: PRIVATE HEALTH INSURANCE | Admitting: Hematology and Oncology

## 2016-05-23 ENCOUNTER — Telehealth: Payer: Self-pay | Admitting: Hematology and Oncology

## 2016-05-23 VITALS — BP 162/78 | HR 103 | Temp 97.9°F | Resp 20 | Ht 68.5 in | Wt 196.1 lb

## 2016-05-23 DIAGNOSIS — I10 Essential (primary) hypertension: Secondary | ICD-10-CM | POA: Diagnosis not present

## 2016-05-23 DIAGNOSIS — D473 Essential (hemorrhagic) thrombocythemia: Secondary | ICD-10-CM

## 2016-05-23 DIAGNOSIS — E119 Type 2 diabetes mellitus without complications: Secondary | ICD-10-CM | POA: Diagnosis not present

## 2016-05-23 LAB — CBC WITH DIFFERENTIAL/PLATELET
BASO%: 0.3 % (ref 0.0–2.0)
Basophils Absolute: 0 10*3/uL (ref 0.0–0.1)
EOS ABS: 0.1 10*3/uL (ref 0.0–0.5)
EOS%: 1.4 % (ref 0.0–7.0)
HEMATOCRIT: 40.4 % (ref 38.4–49.9)
HGB: 13.1 g/dL (ref 13.0–17.1)
LYMPH#: 1 10*3/uL (ref 0.9–3.3)
LYMPH%: 21.7 % (ref 14.0–49.0)
MCH: 26.1 pg — ABNORMAL LOW (ref 27.2–33.4)
MCHC: 32.5 g/dL (ref 32.0–36.0)
MCV: 80.2 fL (ref 79.3–98.0)
MONO#: 0.3 10*3/uL (ref 0.1–0.9)
MONO%: 7 % (ref 0.0–14.0)
NEUT%: 69.6 % (ref 39.0–75.0)
NEUTROS ABS: 3.2 10*3/uL (ref 1.5–6.5)
PLATELETS: 194 10*3/uL (ref 140–400)
RBC: 5.04 10*6/uL (ref 4.20–5.82)
RDW: 16.6 % — ABNORMAL HIGH (ref 11.0–14.6)
WBC: 4.6 10*3/uL (ref 4.0–10.3)

## 2016-05-23 NOTE — Assessment & Plan Note (Signed)
The patient has diabetes. He is overweight. We discussed lifestyle modification and he is motivated to lose weight. He set a goal to weigh less than 190 pounds when I see him back in 6 months.

## 2016-05-23 NOTE — Telephone Encounter (Signed)
per of to sch pt appt-gave pt copy of avs °

## 2016-05-23 NOTE — Progress Notes (Signed)
[place] OFFICE PROGRESS NOTE  Patient Care Team: [name], DO as PCP - General (Family Medicine) Heath Lark, MD as Consulting Physician (Hematology and Oncology)  SUMMARY OF ONCOLOGIC HISTORY:   Essential thrombocythemia ([place])   11/03/1984 Cancer Diagnosis He was noted to have significant abnormal CBC   05/24/2003 Bone Marrow Biopsy Case #: [id number] BM biopsy confirmed essential thrombocythemia   11/06/2003 -  Chemotherapy He was placed on anagrelide    INTERVAL HISTORY: Please see below for problem oriented charting. He returns for further follow-up. He is compliant taking all his medications as prescribed. Denies recent diagnosis of blood clots or bleeding. No recent infection.  REVIEW OF SYSTEMS:   Constitutional: Denies fevers, chills or abnormal weight loss Eyes: Denies blurriness of vision Ears, nose, mouth, throat, and face: Denies mucositis or sore throat Respiratory: Denies cough, dyspnea or wheezes Cardiovascular: Denies palpitation, chest discomfort or lower extremity swelling Gastrointestinal:  Denies nausea, heartburn or change in bowel habits Skin: Denies abnormal skin rashes Lymphatics: Denies new lymphadenopathy or easy bruising Neurological:Denies numbness, tingling or new weaknesses Behavioral/Psych: Mood is stable, no new changes  All other systems were reviewed with the patient and are negative.  I have reviewed the past medical history, past surgical history, social history and family history with the patient and they are unchanged from previous note.  ALLERGIES:  has No Known Allergies.  MEDICATIONS:  Current Outpatient Prescriptions  Medication Sig Dispense Refill  . anagrelide (AGRYLIN) 1 MG capsule Take 3 capsules (3 mg total) by mouth daily. 90 capsule 6  . aspirin 81 MG tablet Take 81 mg by mouth daily.    Marland Kitchen atorvastatin (LIPITOR) 20 MG tablet Take 1 tablet (20 mg total) by mouth daily. NO MORE REFILLS WITHOUT OFFICE VISIT/ LABS  - 2ND NOTICE 15 tablet 0  . fluticasone (FLONASE) 50 MCG/ACT nasal spray PLACE 2 SPRAYS IN EACH NOSTRIL EVERY DAY 16 g 1  . glimepiride (AMARYL) 4 MG tablet TAKE 1 TABLET (4 MG TOTAL) BY MOUTH DAILY BEFORE BREAKFAST.  "NO MORE REFILLS WITHOUT OV" (Patient taking differently: 2 mg. TAKE 1 TABLET (4 MG TOTAL) BY MOUTH DAILY BEFORE BREAKFAST.  "NO MORE REFILLS WITHOUT OV") 30 tablet 0  . glucose blood (ONE TOUCH ULTRA TEST) test strip Use as instructed 100 each 0  . hydrochlorothiazide (MICROZIDE) 12.5 MG capsule TAKE ONE CAPSULE BY MOUTH EVERY DAY 30 capsule 0  . Lancets (ONETOUCH ULTRASOFT) lancets Use as instructed 100 each 0  . lisinopril (PRINIVIL,ZESTRIL) 20 MG tablet     . LORazepam (ATIVAN) 1 MG tablet TAKE 1 TABLET BY MOUTH 3 TIMES A DAY AS NEEDED FOR ANXIETY 30 tablet 2  . MATZIM LA 420 MG 24 hr tablet TAKE 1 TABLET (420 MG TOTAL) BY MOUTH DAILY. 15 tablet 0  . metFORMIN (GLUCOPHAGE-XR) 500 MG 24 hr tablet TAKE 4 TABLETS BY MOUTH EVERY DAY  "OFFICE VISIT NEEDED" 60 tablet 0  . Multiple Vitamins-Minerals (MULTIVITAMIN WITH MINERALS) tablet Take 1 tablet by mouth daily.    Marland Kitchen SYNTHROID 175 MCG tablet TAKE 1 TABLET (175 MCG TOTAL) BY MOUTH DAILY  "NO MORE REFILLS WITHOUT OFFICE VISIT" 15 tablet 0   No current facility-administered medications for this visit.    PHYSICAL EXAMINATION: ECOG PERFORMANCE STATUS: 0 - Asymptomatic  Filed Vitals:   05/23/16 0845  BP: 162/78  Pulse: 103  Temp: 97.9 F (36.6 C)  Resp: 20   Filed Weights   05/23/16 0845  Weight: 196 lb 1.6 oz (  88.95 kg)    GENERAL:alert, no distress and comfortable SKIN: skin color, texture, turgor are normal, no rashes or significant lesions EYES: normal, Conjunctiva are pink and non-injected, sclera clear OROPHARYNX:no exudate, no erythema and lips, buccal mucosa, and tongue normal  NECK: supple, thyroid normal size, non-tender, without nodularity LYMPH:  no palpable lymphadenopathy in the cervical, axillary or  inguinal LUNGS: clear to auscultation and percussion with normal breathing effort HEART: regular rate & rhythm and no murmurs and no lower extremity edema ABDOMEN:abdomen soft, non-tender and normal bowel sounds Musculoskeletal:no cyanosis of digits and no clubbing  NEURO: alert & oriented x 3 with fluent speech, no focal motor/sensory deficits  LABORATORY DATA:  I have reviewed the data as listed    Component Value Date/Time   NA 138 01/25/2015 0943   NA 138 11/14/2014 1500   K 4.4 01/25/2015 0943   K 4.4 11/14/2014 1500   CL 102 01/25/2015 0943   CL 105 09/17/2012 1030   CO2 25 01/25/2015 0943   CO2 27 11/14/2014 1500   GLUCOSE 172* 01/25/2015 0943   GLUCOSE 106 11/14/2014 1500   GLUCOSE 140* 09/17/2012 1030   BUN 16 01/25/2015 0943   BUN 20.3 11/14/2014 1500   CREATININE 0.84 01/25/2015 0943   CREATININE 1.0 11/14/2014 1500   CREATININE 1.08 02/13/2012 1236   CALCIUM 9.5 01/25/2015 0943   CALCIUM 9.3 11/14/2014 1500   PROT 7.0 01/25/2015 0943   PROT 7.3 11/14/2014 1500   ALBUMIN 4.4 01/25/2015 0943   ALBUMIN 4.0 11/14/2014 1500   AST 19 01/25/2015 0943   AST 23 11/14/2014 1500   ALT 28 01/25/2015 0943   ALT 26 11/14/2014 1500   ALKPHOS 90 01/25/2015 0943   ALKPHOS 99 11/14/2014 1500   BILITOT 0.4 01/25/2015 0943   BILITOT 0.26 11/14/2014 1500   GFRNONAA >90 08/28/2011 1020   GFRAA >90 08/28/2011 1020    No results found for: SPEP, UPEP  Lab Results  Component Value Date   WBC 4.6 05/23/2016   NEUTROABS 3.2 05/23/2016   HGB 13.1 05/23/2016   HCT 40.4 05/23/2016   MCV 80.2 05/23/2016   PLT 194 05/23/2016      Chemistry      Component Value Date/Time   NA 138 01/25/2015 0943   NA 138 11/14/2014 1500   K 4.4 01/25/2015 0943   K 4.4 11/14/2014 1500   CL 102 01/25/2015 0943   CL 105 09/17/2012 1030   CO2 25 01/25/2015 0943   CO2 27 11/14/2014 1500   BUN 16 01/25/2015 0943   BUN 20.3 11/14/2014 1500   CREATININE 0.84 01/25/2015 0943   CREATININE 1.0  11/14/2014 1500   CREATININE 1.08 02/13/2012 1236      Component Value Date/Time   CALCIUM 9.5 01/25/2015 0943   CALCIUM 9.3 11/14/2014 1500   ALKPHOS 90 01/25/2015 0943   ALKPHOS 99 11/14/2014 1500   AST 19 01/25/2015 0943   AST 23 11/14/2014 1500   ALT 28 01/25/2015 0943   ALT 26 11/14/2014 1500   BILITOT 0.4 01/25/2015 0943   BILITOT 0.26 11/14/2014 1500      ASSESSMENT & PLAN:  Essential thrombocythemia He tolerated treatment well. I recommend continue the same   Essential hypertension he will continue current medical management. The patient said his BP is usually OK, wondering about white coat hypertension I recommend close follow-up with primary care doctor for medication adjustment.     Diabetes mellitus type 2, uncomplicated ([place]) The patient has diabetes. He is  overweight. We discussed lifestyle modification and he is motivated to lose weight. He set a goal to weigh less than 190 pounds when I see him back in 6 months.   Orders Placed This Encounter  Procedures  . CBC with Differential/Platelet    Standing Status: Future     Number of Occurrences:      Standing Expiration Date: 06/27/2017   All questions were answered. The patient knows to call the clinic with any problems, questions or concerns. No barriers to learning was detected. I spent 15 minutes counseling the patient face to face. The total time spent in the appointment was 20 minutes and more than 50% was on counseling and review of test results     [facility] [place], Benoit, MD 05/23/2016 9:25 AM

## 2016-05-23 NOTE — Assessment & Plan Note (Signed)
He tolerated treatment well. I recommend continue the same   

## 2016-05-23 NOTE — Assessment & Plan Note (Signed)
he will continue current medical management. The patient said his BP is usually OK, wondering about white coat hypertension I recommend close follow-up with primary care doctor for medication adjustment.

## 2016-06-17 MED FILL — ANAGRELIDE HCL 1 MG CAPSULE: 1 | 30 days supply | Qty: 90 | Fill #5

## 2016-07-17 MED FILL — ANAGRELIDE HCL 1 MG CAPSULE: 1 | 30 days supply | Qty: 90 | Fill #6

## 2016-08-11 ENCOUNTER — Other Ambulatory Visit: Payer: Self-pay | Admitting: Hematology and Oncology

## 2016-08-11 MED FILL — ANAGRELIDE HCL 1 MG CAPSULE: 1 | 30 days supply | Qty: 90 | Fill #0

## 2016-08-19 ENCOUNTER — Other Ambulatory Visit: Payer: Self-pay | Admitting: Hematology and Oncology

## 2016-08-19 ENCOUNTER — Telehealth: Payer: Self-pay | Admitting: Medical Oncology

## 2016-08-19 DIAGNOSIS — R042 Hemoptysis: Secondary | ICD-10-CM

## 2016-08-19 NOTE — Telephone Encounter (Signed)
I am not sure what caused that Since he had history of smoking, would consider at least CXR or CT for evaluation We can start with CXR first today or tomorrow first and depending on results, I can consider further eval If he agrees, CXR is a walk-in service. I will place order in the computer

## 2016-08-19 NOTE — Telephone Encounter (Signed)
Calling because he had 1 episode this am when he coughed up a "small amount of liquid BRB blood ". He coughed 3 times during this episode  And blood was less with each cough. He denies any other symptoms. He does not smoke anymore.

## 2016-08-19 NOTE — Telephone Encounter (Signed)
I left this message on pt phone

## 2016-08-19 NOTE — Telephone Encounter (Signed)
Pt will come on Wednesday morning for CXR

## 2016-08-20 ENCOUNTER — Emergency Department (HOSPITAL_COMMUNITY)
Admission: EM | Admit: 2016-08-20 | Discharge: 2016-08-20 | Disposition: A | Discharge status: Home / Self Care | Payer: 59 | Source: Home / Self Care | Attending: Emergency Medicine | Admitting: Emergency Medicine

## 2016-08-20 ENCOUNTER — Encounter (HOSPITAL_COMMUNITY): Payer: Self-pay

## 2016-08-20 ENCOUNTER — Telehealth: Payer: Self-pay | Admitting: *Deleted

## 2016-08-20 ENCOUNTER — Other Ambulatory Visit: Payer: Self-pay | Admitting: Hematology and Oncology

## 2016-08-20 ENCOUNTER — Emergency Department (HOSPITAL_COMMUNITY): Payer: 59

## 2016-08-20 ENCOUNTER — Ambulatory Visit (HOSPITAL_COMMUNITY)
Admission: RE | Admit: 2016-08-20 | Discharge: 2016-08-20 | Disposition: A | Discharge status: Home / Self Care | Payer: 59 | Source: Ambulatory Visit | Attending: Hematology and Oncology | Admitting: Hematology and Oncology

## 2016-08-20 ENCOUNTER — Other Ambulatory Visit: Payer: Self-pay

## 2016-08-20 DIAGNOSIS — I10 Essential (primary) hypertension: Secondary | ICD-10-CM

## 2016-08-20 DIAGNOSIS — R042 Hemoptysis: Secondary | ICD-10-CM

## 2016-08-20 DIAGNOSIS — I2699 Other pulmonary embolism without acute cor pulmonale: Secondary | ICD-10-CM | POA: Diagnosis not present

## 2016-08-20 DIAGNOSIS — R062 Wheezing: Secondary | ICD-10-CM

## 2016-08-20 DIAGNOSIS — E119 Type 2 diabetes mellitus without complications: Secondary | ICD-10-CM

## 2016-08-20 DIAGNOSIS — K920 Hematemesis: Secondary | ICD-10-CM | POA: Diagnosis not present

## 2016-08-20 DIAGNOSIS — Z79899 Other long term (current) drug therapy: Secondary | ICD-10-CM | POA: Insufficient documentation

## 2016-08-20 DIAGNOSIS — Z87891 Personal history of nicotine dependence: Secondary | ICD-10-CM

## 2016-08-20 DIAGNOSIS — Z7982 Long term (current) use of aspirin: Secondary | ICD-10-CM

## 2016-08-20 DIAGNOSIS — Z7984 Long term (current) use of oral hypoglycemic drugs: Secondary | ICD-10-CM

## 2016-08-20 LAB — CBC WITH DIFFERENTIAL/PLATELET
BASOS PCT: 0 %
Basophils Absolute: 0 10*3/uL (ref 0.0–0.1)
EOS ABS: 0.1 10*3/uL (ref 0.0–0.7)
EOS PCT: 2 %
HCT: 40.2 % (ref 39.0–52.0)
HEMOGLOBIN: 13 g/dL (ref 13.0–17.0)
LYMPHS ABS: 1.2 10*3/uL (ref 0.7–4.0)
Lymphocytes Relative: 22 %
MCH: 26.6 pg (ref 26.0–34.0)
MCHC: 32.3 g/dL (ref 30.0–36.0)
MCV: 82.4 fL (ref 78.0–100.0)
MONO ABS: 0.4 10*3/uL (ref 0.1–1.0)
MONOS PCT: 7 %
NEUTROS PCT: 69 %
Neutro Abs: 3.7 10*3/uL (ref 1.7–7.7)
PLATELETS: 196 10*3/uL (ref 150–400)
RBC: 4.88 MIL/uL (ref 4.22–5.81)
RDW: 15.5 % (ref 11.5–15.5)
WBC: 5.3 10*3/uL (ref 4.0–10.5)

## 2016-08-20 LAB — COMPREHENSIVE METABOLIC PANEL
ALBUMIN: 4.3 g/dL (ref 3.5–5.0)
ALK PHOS: 92 U/L (ref 38–126)
ALT: 38 U/L (ref 17–63)
ANION GAP: 9 (ref 5–15)
AST: 27 U/L (ref 15–41)
BUN: 21 mg/dL — ABNORMAL HIGH (ref 6–20)
CALCIUM: 9.2 mg/dL (ref 8.9–10.3)
CHLORIDE: 101 mmol/L (ref 101–111)
CO2: 26 mmol/L (ref 22–32)
Creatinine, Ser: 0.99 mg/dL (ref 0.61–1.24)
GFR calc non Af Amer: >60 mL/min (ref >60)
GLUCOSE: 151 mg/dL — AB (ref 65–99)
POTASSIUM: 4.1 mmol/L (ref 3.5–5.1)
SODIUM: 136 mmol/L (ref 135–145)
Total Bilirubin: 0.7 mg/dL (ref 0.3–1.2)
Total Protein: 7.4 g/dL (ref 6.5–8.1)

## 2016-08-20 LAB — I-STAT TROPONIN, ED
TROPONIN I, POC: 0 ng/mL (ref 0.00–0.08)
TROPONIN I, POC: 0.01 ng/mL (ref 0.00–0.08)

## 2016-08-20 LAB — BRAIN NATRIURETIC PEPTIDE: B Natriuretic Peptide: 34.6 pg/mL (ref 0.0–100.0)

## 2016-08-20 MED ORDER — IOPAMIDOL (ISOVUE-300) INJECTION 61%
75.0000 mL | Freq: Once | INTRAVENOUS | Status: AC | PRN
  Administered 2016-08-20: 75 mL via INTRAVENOUS

## 2016-08-20 MED ORDER — SODIUM CHLORIDE 0.9 % IV BOLUS (SEPSIS): 1000.0000 mL | Freq: Once | INTRAVENOUS | Status: DC

## 2016-08-20 NOTE — ED Notes (Signed)
Pt reports spitting up blood since yesterday, denies pain, sts had similar symptoms in 2009 and was diagnosed with lung infection. Pt sts hemoptysis is not necessarily brought on by violent cough, it more feels like an accumulation in his chest and throat. His oncologist recommended he is seen in ED for chest CT

## 2016-08-20 NOTE — Telephone Encounter (Signed)
Pt notified of message below.   Will hold ASA another 3 days and wait to see if clears. Does not want CT at this time

## 2016-08-20 NOTE — ED Triage Notes (Signed)
Pt has hx of thrombocytopenia.  Pt states hx of coughing up blood and told bacterial infection in 2009.  Pt states he takes a lot of goody powder.  Pt started having similar symptoms yesterday.  Today he came for CXR per his MD office.  Pt told the cxr were clear.  Pt told he could order CT exam.  Pt coughed blood again and told to come here.

## 2016-08-20 NOTE — ED Provider Notes (Signed)
[facility] DEPT Provider Note   CSN: [id number] Arrival date & time: 08/20/16  1258     History   Chief Complaint Chief Complaint  Patient presents with  . Hemoptysis    HPI Charles Bowman is a 55 y.o. male.  The history is provided by the patient.  Cough  This is a recurrent problem. The problem occurs every few hours. The problem has not changed since onset.The cough is productive of bloody sputum. There has been no fever. Associated symptoms include chest pain and wheezing. Pertinent negatives include no chills, no sweats, no rhinorrhea and no shortness of breath. He is not a smoker.  Chest Pain   This is a new problem. The current episode started 3 to 5 hours ago. The problem occurs constantly. The problem has not changed since onset.The pain is mild. The quality of the pain is described as vice-like. The pain does not radiate. Associated symptoms include cough and hemoptysis. Pertinent negatives include no shortness of breath.   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 Seen by Onc who ordered CXR that was negative. Sent for CT. Past Medical History:  Diagnosis Date  . Anemia   . Anxiety 05/11/2014  . Cancer ([place])   . Diabetes mellitus without complication ([place])   . Hypertension   . Murmur 11/09/2014  . Thyroid disease     Patient Active Problem List   Diagnosis Date Noted  . Hemoptysis 08/19/2016  . Murmur 11/09/2014  . PVC's (premature ventricular contractions) 11/09/2014  . Diabetes mellitus type 2, uncomplicated ([place]) 33/82/5053  . Anxiety 05/11/2014  . Essential thrombocythemia ([place]) 09/06/2008  . MYELOPROLIFERATIVE DISORDER 09/06/2008  . HYPOTHYROIDISM 09/06/2008  . Anemia in chronic illness 09/06/2008  . Essential hypertension 09/06/2008    Past Surgical History:  Procedure Laterality Date  . BONE MARROW BIOPSY    . BRONCHOSCOPY         Home Medications    Prior to Admission medications   Medication Sig Start Date End Date Taking? Authorizing  Provider  anagrelide (AGRYLIN) 1 MG capsule TAKE 3 CAPSULES BY MOUTH DAILY. 08/11/16  Yes Heath Lark, MD  atorvastatin (LIPITOR) 20 MG tablet Take 1 tablet (20 mg total) by mouth daily. NO MORE REFILLS WITHOUT OFFICE VISIT/ LABS - 2ND NOTICE 08/31/15  Yes Roselee Culver, MD  fluticasone [facility]) 50 MCG/ACT nasal spray PLACE 2 SPRAYS IN EACH NOSTRIL EVERY DAY 09/18/15  Yes Jaynee Eagles, PA-C  glimepiride (AMARYL) 4 MG tablet TAKE 1 TABLET (4 MG TOTAL) BY MOUTH DAILY BEFORE BREAKFAST.  "NO MORE REFILLS WITHOUT OV" Patient taking differently: 4 mg. TAKE 1 TABLET (4 MG TOTAL) BY MOUTH DAILY BEFORE BREAKFAST.  "NO MORE REFILLS WITHOUT OV" 06/03/15  Yes Ezekiel Slocumb, PA-C  glucose blood (ONE TOUCH ULTRA TEST) test strip Use as instructed 11/15/13  Yes Barton Fanny, MD  hydrochlorothiazide (MICROZIDE) 12.5 MG capsule TAKE ONE CAPSULE BY MOUTH EVERY DAY 02/14/16  Yes Jaynee Eagles, PA-C  Lancets [facility] ULTRASOFT) lancets Use as instructed 11/15/13  Yes Barton Fanny, MD  lisinopril (PRINIVIL,ZESTRIL) 20 MG tablet Take 20 mg by mouth daily.  05/12/16  Yes Historical Provider, MD  LORazepam (ATIVAN) 1 MG tablet TAKE 1 TABLET BY MOUTH 3 TIMES A DAY AS NEEDED FOR ANXIETY 01/25/15  Yes Roselee Culver, MD  MATZIM LA 420 MG 24 hr tablet TAKE 1 TABLET (420 MG TOTAL) BY MOUTH DAILY. 03/18/16  Yes Jaynee Eagles, PA-C  metFORMIN (GLUCOPHAGE-XR) 500 MG 24 hr tablet TAKE 4  TABLETS BY MOUTH EVERY DAY  "OFFICE VISIT NEEDED" Patient taking differently: Take 1,000 mg by mouth 2 (two) times daily.  09/16/15  Yes Roselee Culver, MD  Multiple Vitamins-Minerals (MULTIVITAMIN WITH MINERALS) tablet Take 1 tablet by mouth daily.   Yes Historical Provider, MD  SYNTHROID 175 MCG tablet TAKE 1 TABLET (175 MCG TOTAL) BY MOUTH DAILY  "NO MORE REFILLS WITHOUT OFFICE VISIT" 10/16/15  Yes Roselee Culver, MD  aspirin 81 MG tablet Take 81 mg by mouth daily.    Historical Provider, MD    Family History Family History    Problem Relation Age of Onset  . Cancer Father     prostate ca  . Dementia Father 46    2008  . COPD Mother     +Tobacco Use    Social History Social History  Substance Use Topics  . Smoking status: Former Smoker    Packs/day: 1.50    Years: 20.00    Types: Cigarettes    Quit date: 09/02/2008  . Smokeless tobacco: Never Used  . Alcohol use No     Allergies   Review of patient's allergies indicates no known allergies.   Review of Systems Review of Systems  Constitutional: Negative for chills.  HENT: Negative for rhinorrhea.   Respiratory: Positive for cough, hemoptysis and wheezing. Negative for shortness of breath.   Cardiovascular: Positive for chest pain.     Physical Exam Updated Vital Signs BP 172/92 (BP Location: Right Arm)   Pulse 71   Temp 98.2 F (36.8 C) (Oral)   Resp 18   Ht '5\' 7"'$  (1.702 m)   Wt 196 lb 6 oz (89.1 kg)   SpO2 96%   BMI 30.76 kg/m   Physical Exam  Constitutional: He is oriented to person, place, and time. He appears well-developed and well-nourished. No distress.  HENT:  Head: Normocephalic and atraumatic.  Nose: Nose normal.  Eyes: Conjunctivae and EOM are normal. Pupils are equal, round, and reactive to light. Right eye exhibits no discharge. Left eye exhibits no discharge. No scleral icterus.  Neck: Normal range of motion. Neck supple.  Cardiovascular: Normal rate and regular rhythm.  Exam reveals no gallop and no friction rub.   No murmur heard. Pulmonary/Chest: Effort normal and breath sounds normal. No stridor. No respiratory distress. He has no rales.  Abdominal: Soft. He exhibits no distension. There is no tenderness.  Musculoskeletal: He exhibits no edema or tenderness.  Neurological: He is alert and oriented to person, place, and time.  Skin: Skin is warm and dry. No rash noted. He is not diaphoretic. No erythema.  Psychiatric: He has a normal mood and affect.  Vitals reviewed.    ED Treatments / Results   Labs (all labs ordered are listed, but only abnormal results are displayed) Labs Reviewed  COMPREHENSIVE METABOLIC PANEL - Abnormal; Notable for the following:       Result Value   Glucose, Bld 151 (*)    BUN 21 (*)    All other components within normal limits  CBC WITH DIFFERENTIAL/PLATELET  BRAIN NATRIURETIC PEPTIDE  I-STAT TROPOININ, ED  Randolm Idol, ED    EKG  EKG Interpretation None       Radiology Dg Chest 2 View  Result Date: 08/20/2016 CLINICAL DATA:  Hemoptysis. EXAM: CHEST  2 VIEW COMPARISON:  Radiographs of January 23, 2016. FINDINGS: The heart size and mediastinal contours are within normal limits. Both lungs are clear. No pneumothorax or pleural effusion is noted. The  visualized skeletal structures are unremarkable. IMPRESSION: No active cardiopulmonary disease. Electronically Signed   By: Marijo Conception, M.D.   On: 08/20/2016 10:29   Ct Chest W Contrast  Result Date: 08/20/2016 CLINICAL DATA:  Hemoptysis and history of thrombocytopenia EXAM: CT CHEST WITH CONTRAST TECHNIQUE: Multidetector CT imaging of the chest was performed during intravenous contrast administration. CONTRAST:  71m ISOVUE-300 IOPAMIDOL (ISOVUE-300) INJECTION 61% COMPARISON:  Same day chest radiographs FINDINGS: Cardiovascular: No significant vascular findings. Mild atherosclerosis of the aorta. Normal heart size. No pericardial effusion. Mediastinum/Nodes: No axillary, supraclavicular, mediastinal nor hilar lymphadenopathy. Small hiatal hernia. Lungs/Pleura: No evidence of bronchiectasis. A few small right apical pulmonary blebs are noted. Centrilobular emphysema, upper lobe predominant. No pneumonic consolidation, effusion or CHF. Upper Abdomen: No acute abnormality. Musculoskeletal: No chest wall abnormality. No acute or significant osseous findings. IMPRESSION: Upper lobe predominant centrilobular emphysema. No acute cardiopulmonary disease. Aortic atherosclerosis. Electronically Signed   By:  DAshley RoyaltyM.D.   On: 08/20/2016 17:43    Procedures Procedures (including critical care time)  Medications Ordered in ED Medications  sodium chloride 0.9 % bolus 1,000 mL (not administered)  iopamidol (ISOVUE-300) 61 % injection 75 mL (75 mLs Intravenous Contrast Given 08/20/16 1727)     Initial Impression / Assessment and Plan / ED Course  I have reviewed the triage vital signs and the nursing notes.  Pertinent labs & imaging results that were available during my care of the patient were reviewed by me and considered in my medical decision making (see chart for details).  Clinical Course    CT of the chest revealed no evidence of pneumonia, lesions. No obvious large pulmonary emboli noted. Labs reassuring with normal platelets and hemoglobin. Patient symptoms have now recurred while in the emergency department.  Discussed case with oncology who will follow patient up in clinic.  Discussed findings with the patient to agree to close monitoring and strict return precautions.  Final Clinical Impressions(s) / ED Diagnoses   Final diagnoses:  Hemoptysis   Disposition: Discharge  Condition: Good  I have discussed the results, Dx and Tx plan with the patient who expressed understanding and agree(s) with the plan. Discharge instructions discussed at great length. The patient was given strict return precautions who verbalized understanding of the instructions. No further questions at time of discharge.    Current Discharge Medication List      Follow Up: CLuetta Nutting DO [address] [phone] Call  As needed  NHeath Lark MD [phone] Schedule an appointment as soon as possible for a visit  in 3-5 days      PFatima Blank MD 08/20/16 2005

## 2016-08-20 NOTE — Telephone Encounter (Signed)
"  I know te CXR was clear.  I'm coughing up blood and this is not normal.  I want to go ahead and schedule a CT scan to find out what's going on." Coughing with this call.  Will notify provider of this request.

## 2016-08-20 NOTE — Telephone Encounter (Signed)
-----   Message from Heath Lark, MD sent at 08/20/2016 10:42 AM EDT ----- Regarding: CXR CXR is clear, would not explain his hemoptysis If he is still concerned, we can order CT chest or just see if it would clear up on its own He can also hold aspirin for 3 days before resuming ----- Message ----- From: Interface, Rad Results In Sent: 08/20/2016  10:32 AM To: Heath Lark, MD

## 2016-08-20 NOTE — Telephone Encounter (Signed)
I have ordered urgent CT chest and placed return appt to see me back next Tuesday Charles Bowman, please order lab appt to be done same day as CT, preferably 1 hour before

## 2016-08-20 NOTE — Telephone Encounter (Signed)
Pt called back. States about 10 minutes after speaking with RN , he started coughing up blood for ~ 10 minutes. Is going to ED.

## 2016-08-21 ENCOUNTER — Telehealth: Payer: Self-pay | Admitting: *Deleted

## 2016-08-21 NOTE — Telephone Encounter (Signed)
Can you call him and ask how he is feeling?  I have scheduled him to be seen next Tuesday. If he feels OK, we can cancel his appt and keep his appt in Jan   S/w pt and he says he is feeling better.  He has not coughed up any blood since leaving the ED yesterday.  He understands to not take any more Goody Powders or Aspirin or NSAIDs.    He agees ok to cancel his appt on Tuesday and he will keep appt in January as scheduled.  He will call back sooner if any further problems or concerns.   I canceled his appt on Tuesday.

## 2016-08-22 ENCOUNTER — Encounter (HOSPITAL_COMMUNITY): Payer: Self-pay | Admitting: Emergency Medicine

## 2016-08-22 ENCOUNTER — Observation Stay (HOSPITAL_COMMUNITY): Payer: 59

## 2016-08-22 ENCOUNTER — Inpatient Hospital Stay (HOSPITAL_COMMUNITY)
Admission: EM | Admit: 2016-08-22 | Discharge: 2016-08-29 | DRG: 163 | Disposition: A | Discharge status: Home / Self Care | Payer: 59 | Attending: Internal Medicine | Admitting: Internal Medicine

## 2016-08-22 DIAGNOSIS — E119 Type 2 diabetes mellitus without complications: Secondary | ICD-10-CM | POA: Diagnosis present

## 2016-08-22 DIAGNOSIS — G4733 Obstructive sleep apnea (adult) (pediatric): Secondary | ICD-10-CM | POA: Diagnosis present

## 2016-08-22 DIAGNOSIS — I2699 Other pulmonary embolism without acute cor pulmonale: Principal | ICD-10-CM | POA: Diagnosis present

## 2016-08-22 DIAGNOSIS — E872 Acidosis: Secondary | ICD-10-CM | POA: Diagnosis present

## 2016-08-22 DIAGNOSIS — J440 Chronic obstructive pulmonary disease with acute lower respiratory infection: Secondary | ICD-10-CM | POA: Diagnosis present

## 2016-08-22 DIAGNOSIS — E860 Dehydration: Secondary | ICD-10-CM | POA: Diagnosis present

## 2016-08-22 DIAGNOSIS — I10 Essential (primary) hypertension: Secondary | ICD-10-CM | POA: Diagnosis present

## 2016-08-22 DIAGNOSIS — Z7984 Long term (current) use of oral hypoglycemic drugs: Secondary | ICD-10-CM

## 2016-08-22 DIAGNOSIS — R042 Hemoptysis: Secondary | ICD-10-CM | POA: Diagnosis present

## 2016-08-22 DIAGNOSIS — D473 Essential (hemorrhagic) thrombocythemia: Secondary | ICD-10-CM | POA: Diagnosis present

## 2016-08-22 DIAGNOSIS — Z87891 Personal history of nicotine dependence: Secondary | ICD-10-CM

## 2016-08-22 DIAGNOSIS — Z825 Family history of asthma and other chronic lower respiratory diseases: Secondary | ICD-10-CM

## 2016-08-22 DIAGNOSIS — E871 Hypo-osmolality and hyponatremia: Secondary | ICD-10-CM | POA: Diagnosis present

## 2016-08-22 DIAGNOSIS — J181 Lobar pneumonia, unspecified organism: Secondary | ICD-10-CM | POA: Diagnosis present

## 2016-08-22 DIAGNOSIS — F419 Anxiety disorder, unspecified: Secondary | ICD-10-CM | POA: Diagnosis present

## 2016-08-22 DIAGNOSIS — R000 Tachycardia, unspecified: Secondary | ICD-10-CM | POA: Diagnosis present

## 2016-08-22 DIAGNOSIS — E039 Hypothyroidism, unspecified: Secondary | ICD-10-CM | POA: Diagnosis present

## 2016-08-22 DIAGNOSIS — R011 Cardiac murmur, unspecified: Secondary | ICD-10-CM | POA: Diagnosis present

## 2016-08-22 DIAGNOSIS — D638 Anemia in other chronic diseases classified elsewhere: Secondary | ICD-10-CM | POA: Diagnosis present

## 2016-08-22 DIAGNOSIS — D693 Immune thrombocytopenic purpura: Secondary | ICD-10-CM | POA: Diagnosis present

## 2016-08-22 DIAGNOSIS — Z7951 Long term (current) use of inhaled steroids: Secondary | ICD-10-CM

## 2016-08-22 DIAGNOSIS — Z8042 Family history of malignant neoplasm of prostate: Secondary | ICD-10-CM

## 2016-08-22 DIAGNOSIS — Z7982 Long term (current) use of aspirin: Secondary | ICD-10-CM

## 2016-08-22 LAB — COMPREHENSIVE METABOLIC PANEL
ALK PHOS: 97 U/L (ref 38–126)
ALT: 39 U/L (ref 17–63)
ANION GAP: 10 (ref 5–15)
AST: 29 U/L (ref 15–41)
Albumin: 4.5 g/dL (ref 3.5–5.0)
BILIRUBIN TOTAL: 0.8 mg/dL (ref 0.3–1.2)
BUN: 23 mg/dL — ABNORMAL HIGH (ref 6–20)
CALCIUM: 9.6 mg/dL (ref 8.9–10.3)
CO2: 26 mmol/L (ref 22–32)
Chloride: 98 mmol/L — ABNORMAL LOW (ref 101–111)
Creatinine, Ser: 1.12 mg/dL (ref 0.61–1.24)
GFR calc non Af Amer: >60 mL/min (ref >60)
Glucose, Bld: 270 mg/dL — ABNORMAL HIGH (ref 65–99)
Potassium: 4.3 mmol/L (ref 3.5–5.1)
SODIUM: 134 mmol/L — AB (ref 135–145)
TOTAL PROTEIN: 7.9 g/dL (ref 6.5–8.1)

## 2016-08-22 LAB — CBC
HCT: 40.8 % (ref 39.0–52.0)
HEMOGLOBIN: 13.8 g/dL (ref 13.0–17.0)
MCH: 27 pg (ref 26.0–34.0)
MCHC: 33.8 g/dL (ref 30.0–36.0)
MCV: 79.8 fL (ref 78.0–100.0)
Platelets: 222 10*3/uL (ref 150–400)
RBC: 5.11 MIL/uL (ref 4.22–5.81)
RDW: 15.2 % (ref 11.5–15.5)
WBC: 5.9 10*3/uL (ref 4.0–10.5)

## 2016-08-22 LAB — ABO/RH: ABO/RH(D): O POS

## 2016-08-22 LAB — I-STAT CG4 LACTIC ACID, ED: Lactic Acid, Venous: 2.78 mmol/L (ref 0.5–1.9)

## 2016-08-22 LAB — GLUCOSE, CAPILLARY
GLUCOSE-CAPILLARY: 122 mg/dL — AB (ref 65–99)
Glucose-Capillary: 211 mg/dL — ABNORMAL HIGH (ref 65–99)

## 2016-08-22 LAB — PROTIME-INR
INR: 0.94
Prothrombin Time: 12.5 seconds (ref 11.4–15.2)

## 2016-08-22 LAB — MRSA PCR SCREENING: MRSA by PCR: NEGATIVE

## 2016-08-22 LAB — PREPARE RBC (CROSSMATCH)

## 2016-08-22 LAB — LACTIC ACID, PLASMA: LACTIC ACID, VENOUS: 1.7 mmol/L (ref 0.5–1.9)

## 2016-08-22 MED ORDER — VANCOMYCIN HCL 10 G IV SOLR
1500.0000 mg | Freq: Once | INTRAVENOUS | Status: AC
  Administered 2016-08-22: 1500 mg via INTRAVENOUS
  Filled 2016-08-22: qty 1500

## 2016-08-22 MED ORDER — SALINE SPRAY 0.65 % NA SOLN
1.0000 | Freq: Two times a day (BID) | NASAL | Status: DC
  Administered 2016-08-23 – 2016-08-29 (×8): 1 via NASAL
  Filled 2016-08-22: qty 44

## 2016-08-22 MED ORDER — PIPERACILLIN-TAZOBACTAM 3.375 G IVPB
3.3750 g | Freq: Three times a day (TID) | INTRAVENOUS | Status: DC
  Administered 2016-08-23 – 2016-08-29 (×19): 3.375 g via INTRAVENOUS
  Filled 2016-08-22 (×18): qty 50

## 2016-08-22 MED ORDER — VANCOMYCIN HCL IN DEXTROSE 1-5 GM/200ML-% IV SOLN
1000.0000 mg | Freq: Three times a day (TID) | INTRAVENOUS | Status: DC
  Administered 2016-08-23 – 2016-08-25 (×7): 1000 mg via INTRAVENOUS
  Filled 2016-08-22 (×7): qty 200

## 2016-08-22 MED ORDER — SODIUM CHLORIDE 0.9 % IV SOLN: Freq: Once | INTRAVENOUS | Status: DC

## 2016-08-22 MED ORDER — ACETAMINOPHEN 650 MG RE SUPP: 650.0000 mg | Freq: Four times a day (QID) | RECTAL | Status: DC | PRN

## 2016-08-22 MED ORDER — PIPERACILLIN-TAZOBACTAM 3.375 G IVPB 30 MIN
3.3750 g | Freq: Once | INTRAVENOUS | Status: AC
  Administered 2016-08-22: 3.375 g via INTRAVENOUS
  Filled 2016-08-22: qty 50

## 2016-08-22 MED ORDER — ONDANSETRON HCL 4 MG/2ML IJ SOLN: 4.0000 mg | Freq: Four times a day (QID) | INTRAMUSCULAR | Status: DC | PRN

## 2016-08-22 MED ORDER — ATORVASTATIN CALCIUM 20 MG PO TABS
20.0000 mg | ORAL_TABLET | Freq: Every day | ORAL | Status: DC
  Administered 2016-08-23 – 2016-08-29 (×7): 20 mg via ORAL
  Filled 2016-08-22: qty 1
  Filled 2016-08-22 (×6): qty 2

## 2016-08-22 MED ORDER — HYDROCODONE-ACETAMINOPHEN 5-325 MG PO TABS
1.0000 | ORAL_TABLET | ORAL | Status: DC | PRN
  Administered 2016-08-23 – 2016-08-25 (×6): 1 via ORAL
  Administered 2016-08-27: 2 via ORAL
  Administered 2016-08-28 – 2016-08-29 (×3): 1 via ORAL
  Filled 2016-08-22: qty 1
  Filled 2016-08-22: qty 2
  Filled 2016-08-22 (×4): qty 1
  Filled 2016-08-22: qty 2
  Filled 2016-08-22 (×3): qty 1

## 2016-08-22 MED ORDER — SODIUM CHLORIDE 0.9 % IV SOLN
Freq: Once | INTRAVENOUS | Status: AC
Start: 2016-08-22 — End: 2016-08-22
  Administered 2016-08-22: 17:00:00 via INTRAVENOUS

## 2016-08-22 MED ORDER — LEVOTHYROXINE SODIUM 75 MCG PO TABS
175.0000 ug | ORAL_TABLET | Freq: Every day | ORAL | Status: DC
  Administered 2016-08-22 – 2016-08-28 (×7): 175 ug via ORAL
  Filled 2016-08-22 (×7): qty 1

## 2016-08-22 MED ORDER — INSULIN ASPART 100 UNIT/ML ~~LOC~~ SOLN
0.0000 [IU] | Freq: Three times a day (TID) | SUBCUTANEOUS | Status: DC
  Administered 2016-08-22 – 2016-08-23 (×3): 5 [IU] via SUBCUTANEOUS
  Administered 2016-08-23 – 2016-08-24 (×2): 3 [IU] via SUBCUTANEOUS
  Administered 2016-08-24: 8 [IU] via SUBCUTANEOUS
  Administered 2016-08-24: 5 [IU] via SUBCUTANEOUS
  Administered 2016-08-25: 3 [IU] via SUBCUTANEOUS
  Administered 2016-08-25: 8 [IU] via SUBCUTANEOUS
  Administered 2016-08-25 – 2016-08-26 (×2): 3 [IU] via SUBCUTANEOUS
  Administered 2016-08-26: 5 [IU] via SUBCUTANEOUS
  Administered 2016-08-26: 8 [IU] via SUBCUTANEOUS
  Administered 2016-08-27 – 2016-08-29 (×5): 3 [IU] via SUBCUTANEOUS
  Administered 2016-08-29: 5 [IU] via SUBCUTANEOUS

## 2016-08-22 MED ORDER — HYDROCHLOROTHIAZIDE 12.5 MG PO CAPS: 12.5000 mg | ORAL_CAPSULE | Freq: Every day | ORAL | Status: DC

## 2016-08-22 MED ORDER — DILTIAZEM HCL ER COATED BEADS 420 MG PO TB24: 420.0000 mg | ORAL_TABLET | Freq: Every day | ORAL | Status: DC

## 2016-08-22 MED ORDER — LISINOPRIL 10 MG PO TABS
20.0000 mg | ORAL_TABLET | Freq: Every day | ORAL | Status: DC
  Administered 2016-08-23: 20 mg via ORAL
  Filled 2016-08-22: qty 2

## 2016-08-22 MED ORDER — ACETAMINOPHEN 325 MG PO TABS: 650.0000 mg | ORAL_TABLET | Freq: Four times a day (QID) | ORAL | Status: DC | PRN

## 2016-08-22 MED ORDER — SODIUM CHLORIDE 0.9 % IV SOLN
INTRAVENOUS | Status: AC
  Administered 2016-08-22: 14:00:00 via INTRAVENOUS

## 2016-08-22 MED ORDER — INSULIN ASPART 100 UNIT/ML ~~LOC~~ SOLN
0.0000 [IU] | Freq: Every day | SUBCUTANEOUS | Status: DC
  Administered 2016-08-24: 2 [IU] via SUBCUTANEOUS
  Administered 2016-08-28: 3 [IU] via SUBCUTANEOUS

## 2016-08-22 MED ORDER — IOPAMIDOL (ISOVUE-370) INJECTION 76%
100.0000 mL | Freq: Once | INTRAVENOUS | Status: AC | PRN
  Administered 2016-08-22: 100 mL via INTRAVENOUS

## 2016-08-22 MED ORDER — ONDANSETRON HCL 4 MG PO TABS: 4.0000 mg | ORAL_TABLET | Freq: Four times a day (QID) | ORAL | Status: DC | PRN

## 2016-08-22 MED ORDER — DILTIAZEM HCL ER COATED BEADS 240 MG PO CP24
420.0000 mg | ORAL_CAPSULE | Freq: Every day | ORAL | Status: DC
  Administered 2016-08-23 – 2016-08-29 (×7): 420 mg via ORAL
  Filled 2016-08-22: qty 1
  Filled 2016-08-22 (×6): qty 2

## 2016-08-22 NOTE — ED Provider Notes (Signed)
[facility] DEPT Provider Note   CSN: [id number] Arrival date & time: 08/22/16  0857     History   Chief Complaint Chief Complaint  Patient presents with  . Hematemesis    HPI Charles Bowman is a 55 y.o. male.  HPI Patient has a known history of essential thrombocythemia. He takes AGRYLIN and aspirin. Patient has hemoptysis started 4 days ago. He can feel a buildup in his throat and then coughs and brings up clots and bright red blood. He reports that he coughs for several minutes and brings up several tablespoons or more of blood at a time. Once it resolves, it may not come back again for several hours to days. Symptom first started on Tuesday. He had several more episodes on Wednesday. Then again this morning he has coughed over 4 times and produced bright red blood with clots. Patient ports he has not had any kind of continuous chest pain with this. He occasionally gets a left chest discomfort. This however is something he's had intermittently for a long time and proceeded hemoptysis. No fevers or chills. Patient denies other associated symptoms. He reports the last time he had something similar, he ultimately had a lung infection the cut diagnosed by bronchoscopy. Past Medical History:  Diagnosis Date  . Anemia   . Anxiety 05/11/2014  . Cancer ([place])   . Diabetes mellitus without complication ([place])   . Hypertension   . Murmur 11/09/2014  . Thyroid disease     Patient Active Problem List   Diagnosis Date Noted  . Hemoptysis 08/19/2016  . Murmur 11/09/2014  . PVC's (premature ventricular contractions) 11/09/2014  . Diabetes mellitus type 2, uncomplicated ([place]) 51/70/0174  . Anxiety 05/11/2014  . Essential thrombocythemia ([place]) 09/06/2008  . MYELOPROLIFERATIVE DISORDER 09/06/2008  . HYPOTHYROIDISM 09/06/2008  . Anemia in chronic illness 09/06/2008  . Essential hypertension 09/06/2008    Past Surgical History:  Procedure Laterality Date  . BONE MARROW BIOPSY    .  BRONCHOSCOPY         Home Medications    Prior to Admission medications   Medication Sig Start Date End Date Taking? Authorizing Provider  anagrelide (AGRYLIN) 1 MG capsule TAKE 3 CAPSULES BY MOUTH DAILY. Patient taking differently: TAKE 2 IN THE  MORNING AND 1 IN THE EVENING 08/11/16  Yes Heath Lark, MD  aspirin 81 MG tablet Take 81 mg by mouth daily.   Yes Historical Provider, MD  atorvastatin (LIPITOR) 20 MG tablet Take 1 tablet (20 mg total) by mouth daily. NO MORE REFILLS WITHOUT OFFICE VISIT/ LABS - 2ND NOTICE 08/31/15  Yes Roselee Culver, MD  fluticasone [facility]) 50 MCG/ACT nasal spray PLACE 2 SPRAYS IN EACH NOSTRIL EVERY DAY 09/18/15  Yes Jaynee Eagles, PA-C  glimepiride (AMARYL) 4 MG tablet TAKE 1 TABLET (4 MG TOTAL) BY MOUTH DAILY BEFORE BREAKFAST.  "NO MORE REFILLS WITHOUT OV" Patient taking differently: 4 mg. TAKE 1 TABLET (4 MG TOTAL) BY MOUTH DAILY BEFORE BREAKFAST.  "NO MORE REFILLS WITHOUT OV" 06/03/15  Yes Ezekiel Slocumb, PA-C  hydrochlorothiazide (MICROZIDE) 12.5 MG capsule TAKE ONE CAPSULE BY MOUTH EVERY DAY 02/14/16  Yes Jaynee Eagles, PA-C  lisinopril (PRINIVIL,ZESTRIL) 20 MG tablet Take 20 mg by mouth daily.  05/12/16  Yes Historical Provider, MD  LORazepam (ATIVAN) 1 MG tablet TAKE 1 TABLET BY MOUTH 3 TIMES A DAY AS NEEDED FOR ANXIETY 01/25/15  Yes Roselee Culver, MD  MATZIM LA 420 MG 24 hr tablet TAKE 1 TABLET (420 MG  TOTAL) BY MOUTH DAILY. 03/18/16  Yes Jaynee Eagles, PA-C  metFORMIN (GLUCOPHAGE-XR) 500 MG 24 hr tablet TAKE 4 TABLETS BY MOUTH EVERY DAY  "OFFICE VISIT NEEDED" Patient taking differently: Take 1,000 mg by mouth 2 (two) times daily.  09/16/15  Yes Roselee Culver, MD  Multiple Vitamins-Minerals (MULTIVITAMIN WITH MINERALS) tablet Take 1 tablet by mouth daily.   Yes Historical Provider, MD  SYNTHROID 175 MCG tablet TAKE 1 TABLET (175 MCG TOTAL) BY MOUTH DAILY  "NO MORE REFILLS WITHOUT OFFICE VISIT" 10/16/15  Yes Roselee Culver, MD  glucose blood (ONE  TOUCH ULTRA TEST) test strip Use as instructed 11/15/13   Barton Fanny, MD  Lancets [facility] ULTRASOFT) lancets Use as instructed 11/15/13   Barton Fanny, MD    Family History Family History  Problem Relation Age of Onset  . Cancer Father     prostate ca  . Dementia Father 61    2008  . COPD Mother     +Tobacco Use    Social History Social History  Substance Use Topics  . Smoking status: Former Smoker    Packs/day: 1.50    Years: 20.00    Types: Cigarettes    Quit date: 09/02/2008  . Smokeless tobacco: Never Used  . Alcohol use No     Allergies   Review of patient's allergies indicates no known allergies.   Review of Systems Review of Systems 10 Systems reviewed and are negative for acute change except as noted in the HPI.   Physical Exam Updated Vital Signs BP 160/85   Pulse 107   Temp 98 F (36.7 C) (Oral)   Resp 18   Ht '5\' 7"'  (1.702 m)   Wt 195 lb (88.5 kg)   SpO2 94%   BMI 30.54 kg/m   Physical Exam  Constitutional: He is oriented to person, place, and time.  Patient has mild central obesity. He is alert and nontoxic. No respiratory distress at rest. Monitor shows sinus rhythm tachycardic lower 100s. Blood pressure is hypertensive. Oxygen saturation 93%  HENT:  Head: Normocephalic and atraumatic.  Right Ear: External ear normal.  Left Ear: External ear normal.  Nose: Nose normal.  Mouth/Throat: Oropharynx is clear and moist.  Bilateral TMs normal without hemotympanum. Nares patent without blood or clot. Posterior or first widely patent. No blood or mucus streaking of blood in the oral cavity. No sources of intraoral bleeding.  Eyes: EOM are normal. Pupils are equal, round, and reactive to light.  Neck: Neck supple.  Cardiovascular:  Tachycardia. No gross rub murmur gallop.  Pulmonary/Chest: No stridor.  No respiratory distress. Patient has good air flow throughout. They do appreciate a focal area of expiratory wheeze at the lower right  lung base. Also some Rales at the lateral left auscultation.  Abdominal: Soft. Bowel sounds are normal. He exhibits no distension. There is no tenderness. There is no guarding.  Musculoskeletal: Normal range of motion. He exhibits no edema, tenderness or deformity.  Neurological: He is alert and oriented to person, place, and time. No cranial nerve deficit. He exhibits normal muscle tone. Coordination normal.  Skin: Skin is warm and dry. No pallor.  Psychiatric: He has a normal mood and affect.     ED Treatments / Results  Labs (all labs ordered are listed, but only abnormal results are displayed) Labs Reviewed  COMPREHENSIVE METABOLIC PANEL - Abnormal; Notable for the following:       Result Value   Sodium 134 (*)  Chloride 98 (*)    Glucose, Bld 270 (*)    BUN 23 (*)    All other components within normal limits  I-STAT CG4 LACTIC ACID, ED - Abnormal; Notable for the following:    Lactic Acid, Venous 2.78 (*)    All other components within normal limits  CBC  PROTIME-INR  TYPE AND SCREEN  ABO/RH    EKG  EKG Interpretation None       Radiology Ct Chest W Contrast  Result Date: 08/20/2016 CLINICAL DATA:  Hemoptysis and history of thrombocytopenia EXAM: CT CHEST WITH CONTRAST TECHNIQUE: Multidetector CT imaging of the chest was performed during intravenous contrast administration. CONTRAST:  65m ISOVUE-300 IOPAMIDOL (ISOVUE-300) INJECTION 61% COMPARISON:  Same day chest radiographs FINDINGS: Cardiovascular: No significant vascular findings. Mild atherosclerosis of the aorta. Normal heart size. No pericardial effusion. Mediastinum/Nodes: No axillary, supraclavicular, mediastinal nor hilar lymphadenopathy. Small hiatal hernia. Lungs/Pleura: No evidence of bronchiectasis. A few small right apical pulmonary blebs are noted. Centrilobular emphysema, upper lobe predominant. No pneumonic consolidation, effusion or CHF. Upper Abdomen: No acute abnormality. Musculoskeletal: No chest  wall abnormality. No acute or significant osseous findings. IMPRESSION: Upper lobe predominant centrilobular emphysema. No acute cardiopulmonary disease. Aortic atherosclerosis. Electronically Signed   By: DAshley RoyaltyM.D.   On: 08/20/2016 17:43    Procedures Procedures (including critical care time)  Medications Ordered in ED Medications - No data to display   Initial Impression / Assessment and Plan / ED Course  I have reviewed the triage vital signs and the nursing notes.  Pertinent labs & imaging results that were available during my care of the patient were reviewed by me and considered in my medical decision making (see chart for details).  Clinical Course  Comment By Time  Consulted with Dr.kale of oncology. He will review the case with Dr. GRozetta Nunneryand return call. MCharlesetta Shanks MD 10/20 1026  11:15 reviewed with Dr. RWynelle ClevelandTriad hospitalist for admission.  Final Clinical Impressions(s) / ED Diagnoses   Final diagnoses:  Hemoptysis  [place]    Patient with recurrent hemoptysis, tachycardia and history of essential thrombocythemia. Plan will be for admission for further diagnostic evaluation. New Prescriptions New Prescriptions   No medications on file     MCharlesetta Shanks MD 08/22/16 1119

## 2016-08-22 NOTE — Progress Notes (Signed)
Pharmacy Antibiotic Note  Charles Bowman is a 55 y.o. 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 CTa chest reveals likely infiltrate.  Pharmacy has been consulted for vancomycin and Zosyn dosing.  Plan:  Vancomycin 1500 mg IV now, then 1000 mg IV q8 hr; goal trough 15-20 mcg/mL  Measure vancomycin trough levels at steady state as indicated  Zosyn 3.375 g IV given once over 30 minutes, then every 8 hrs by 4-hr infusion  Follow clinical course, renal function, culture results as available  Follow for de-escalation of antibiotics and LOT   Height: 5\' 7"  (170.2 cm) Weight: 192 lb 1.6 oz (87.1 kg) IBW/kg (Calculated) : 66.1  Temp (24hrs), Avg:98.1 F (36.7 C), Min:98 F (36.7 C), Max:98.1 F (36.7 C)   Recent Labs Lab 08/20/16 1605 08/22/16 0950 08/22/16 1017 08/22/16 1401  WBC 5.3 5.9  --   --   CREATININE 0.99 1.12  --   --   LATICACIDVEN  --   --  2.78* 1.7    Estimated Creatinine Clearance: 81.3 mL/min (by C-G formula based on SCr of 1.12 mg/dL).    No Known Allergies  Antimicrobials this admission: vancomycin 10/20 >>  Zosyn 10/20 >>   Dose adjustments this admission: ---  Microbiology results: 10/20 MRSA PCR: sent  Thank you for allowing pharmacy to be a part of this patient's care.  Reuel Boom, PharmD, BCPS Pager: [phone] 08/22/2016, 8:56 PM

## 2016-08-22 NOTE — ED Notes (Signed)
Floor states that they do not have a bed in the room.  Transport delayed until the floor can obtain a bed to assigned room. Pt made aware.

## 2016-08-22 NOTE — ED Triage Notes (Addendum)
Pt complaint of continued "coughing up blood" evaluated for same on Tuesday. Pt hx of same related to bacterial infection. Pt hx of cancer and clotting disorder.

## 2016-08-22 NOTE — ED Notes (Signed)
Spoke with Pfeiffer MD aware of pt status and complaint.

## 2016-08-22 NOTE — H&P (Deleted)
History and Physical    Charles Bowman VXY:[id number] DOB: 1961-10-22 DOA: 08/22/2016    PCP: [name], DO  Patient coming from: home  Chief Complaint: coughing up blood  HPI: Charles Bowman is a 55 y.o. male with medical history significant of essential thrombocytosis, DM, HTN, hypothyroid who presents with hemoptysis for 4 days. He has a cough only when he feels that he needs to cough up blood. No fever, chills sweats. No chest pain, sore throat or post nasal drip. He stopped his baby aspirin a couple of weeks ago but was taking BC powders which he stopped on Tuesday. He underwent a CT chest on 10/18 which showed emphysema.   ED Course: mild tachycardia, sodium 134, BUN 23, Cr 1.12, Lactic acid 2.78  Review of Systems:  All other systems reviewed and apart from HPI, are negative.  Past Medical History:  Diagnosis Date  . Anemia   . Anxiety 05/11/2014  . Cancer ([place])   . Diabetes mellitus without complication ([place])   . Hypertension   . Murmur 11/09/2014  . Thyroid disease     Past Surgical History:  Procedure Laterality Date  . BONE MARROW BIOPSY    . BRONCHOSCOPY      Social History:   reports that he quit smoking about 7 years ago. His smoking use included Cigarettes. He has a 30.00 pack-year smoking history. He has never used smokeless tobacco. He reports that he does not drink alcohol or use drugs.  No Known Allergies  Family History  Problem Relation Age of Onset  . Cancer Father     prostate ca  . Dementia Father 34    2008  . COPD Mother     +Tobacco Use     Prior to Admission medications   Medication Sig Start Date End Date Taking? Authorizing Provider  anagrelide (AGRYLIN) 1 MG capsule TAKE 3 CAPSULES BY MOUTH DAILY. Patient taking differently: TAKE 2 IN THE  MORNING AND 1 IN THE EVENING 08/11/16  Yes Heath Lark, MD  aspirin 81 MG tablet Take 81 mg by mouth daily.   Yes Historical Provider, MD  atorvastatin (LIPITOR) 20 MG tablet Take 1 tablet (20  mg total) by mouth daily. NO MORE REFILLS WITHOUT OFFICE VISIT/ LABS - 2ND NOTICE 08/31/15  Yes Roselee Culver, MD  fluticasone [facility]) 50 MCG/ACT nasal spray PLACE 2 SPRAYS IN EACH NOSTRIL EVERY DAY 09/18/15  Yes Jaynee Eagles, PA-C  glimepiride (AMARYL) 4 MG tablet TAKE 1 TABLET (4 MG TOTAL) BY MOUTH DAILY BEFORE BREAKFAST.  "NO MORE REFILLS WITHOUT OV" Patient taking differently: 4 mg. TAKE 1 TABLET (4 MG TOTAL) BY MOUTH DAILY BEFORE BREAKFAST.  "NO MORE REFILLS WITHOUT OV" 06/03/15  Yes Ezekiel Slocumb, PA-C  hydrochlorothiazide (MICROZIDE) 12.5 MG capsule TAKE ONE CAPSULE BY MOUTH EVERY DAY 02/14/16  Yes Jaynee Eagles, PA-C  lisinopril (PRINIVIL,ZESTRIL) 20 MG tablet Take 20 mg by mouth daily.  05/12/16  Yes Historical Provider, MD  LORazepam (ATIVAN) 1 MG tablet TAKE 1 TABLET BY MOUTH 3 TIMES A DAY AS NEEDED FOR ANXIETY 01/25/15  Yes Roselee Culver, MD  MATZIM LA 420 MG 24 hr tablet TAKE 1 TABLET (420 MG TOTAL) BY MOUTH DAILY. 03/18/16  Yes Jaynee Eagles, PA-C  metFORMIN (GLUCOPHAGE-XR) 500 MG 24 hr tablet TAKE 4 TABLETS BY MOUTH EVERY DAY  "OFFICE VISIT NEEDED" Patient taking differently: Take 1,000 mg by mouth 2 (two) times daily.  09/16/15  Yes Roselee Culver, MD  Multiple Vitamins-Minerals (  MULTIVITAMIN WITH MINERALS) tablet Take 1 tablet by mouth daily.   Yes Historical Provider, MD  SYNTHROID 175 MCG tablet TAKE 1 TABLET (175 MCG TOTAL) BY MOUTH DAILY  "NO MORE REFILLS WITHOUT OFFICE VISIT" 10/16/15  Yes Roselee Culver, MD  glucose blood (ONE TOUCH ULTRA TEST) test strip Use as instructed 11/15/13   Barton Fanny, MD  Lancets [facility] ULTRASOFT) lancets Use as instructed 11/15/13   Barton Fanny, MD    Physical Exam: Vitals:   08/22/16 1005 08/22/16 1044 08/22/16 1130 08/22/16 1215  BP: 164/88 160/85 135/84 161/84  Pulse: 113 107 98 92  Resp: '15 18 15 15  ' Temp:      TempSrc:      SpO2: 95% 94% 91% 97%  Weight:      Height:          Constitutional: NAD, calm,  comfortable Eyes: PERTLA, lids and conjunctivae normal ENMT: Mucous membranes are moist. Posterior pharynx clear of any exudate or lesions. Normal dentition.  Neck: normal, supple, no masses, no thyromegaly Respiratory: clear to auscultation bilaterally, no wheezing, no crackles. Normal respiratory effort. No accessory muscle use.  Cardiovascular: S1 & S2 heard, regular rate and rhythm, no murmurs / rubs / gallops. No extremity edema. 2+ pedal pulses. No carotid bruits.  Abdomen: No distension, no tenderness, no masses palpated. No hepatosplenomegaly. Bowel sounds normal.  Musculoskeletal: no clubbing / cyanosis. No joint deformity upper and lower extremities. Good ROM, no contractures. Normal muscle tone.  Skin: no rashes, lesions, ulcers. No induration Neurologic: CN 2-12 grossly intact. Sensation intact, DTR normal. Strength 5/5 in all 4 limbs.  Psychiatric: Normal judgment and insight. Alert and oriented x 3. Normal mood.     Labs on Admission: I have personally reviewed following labs and imaging studies  CBC:  Recent Labs Lab 08/20/16 1605 08/22/16 0950  WBC 5.3 5.9  NEUTROABS 3.7  --   HGB 13.0 13.8  HCT 40.2 40.8  MCV 82.4 79.8  PLT 196 017   Basic Metabolic Panel:  Recent Labs Lab 08/20/16 1605 08/22/16 0950  NA 136 134*  K 4.1 4.3  CL 101 98*  CO2 26 26  GLUCOSE 151* 270*  BUN 21* 23*  CREATININE 0.99 1.12  CALCIUM 9.2 9.6   GFR: Estimated Creatinine Clearance: 82 mL/min (by C-G formula based on SCr of 1.12 mg/dL). Liver Function Tests:  Recent Labs Lab 08/20/16 1605 08/22/16 0950  AST 27 29  ALT 38 39  ALKPHOS 92 97  BILITOT 0.7 0.8  PROT 7.4 7.9  ALBUMIN 4.3 4.5   No results for input(s): LIPASE, AMYLASE in the last 168 hours. No results for input(s): AMMONIA in the last 168 hours. Coagulation Profile:  Recent Labs Lab 08/22/16 0954  INR 0.94   Cardiac Enzymes: No results for input(s): CKTOTAL, CKMB, CKMBINDEX, TROPONINI in the last 168  hours. BNP (last 3 results) No results for input(s): PROBNP in the last 8760 hours. HbA1C: No results for input(s): HGBA1C in the last 72 hours. CBG: No results for input(s): GLUCAP in the last 168 hours. Lipid Profile: No results for input(s): CHOL, HDL, LDLCALC, TRIG, CHOLHDL, LDLDIRECT in the last 72 hours. Thyroid Function Tests: No results for input(s): TSH, T4TOTAL, FREET4, T3FREE, THYROIDAB in the last 72 hours. Anemia Panel: No results for input(s): VITAMINB12, FOLATE, FERRITIN, TIBC, IRON, RETICCTPCT in the last 72 hours. Urine analysis:    Component Value Date/Time   COLORURINE YELLOW 08/28/2011 0913   APPEARANCEUR CLEAR 08/28/2011 0913  LABSPEC 1.006 08/28/2011 0913   PHURINE 6.5 08/28/2011 0913   GLUCOSEU NEGATIVE 08/28/2011 0913   HGBUR NEGATIVE 08/28/2011 0913   BILIRUBINUR NEGATIVE 08/28/2011 0913   KETONESUR NEGATIVE 08/28/2011 0913   PROTEINUR NEGATIVE 08/28/2011 0913   UROBILINOGEN 0.2 08/28/2011 0913   NITRITE NEGATIVE 08/28/2011 0913   LEUKOCYTESUR NEGATIVE 08/28/2011 0913   Sepsis Labs: '@LABRCNTIP' (procalcitonin:4,lacticidven:4) )No results found for this or any previous visit (from the past 240 hour(s)).   Radiological Exams on Admission: Ct Chest W Contrast  Result Date: 08/20/2016 CLINICAL DATA:  Hemoptysis and history of thrombocytopenia EXAM: CT CHEST WITH CONTRAST TECHNIQUE: Multidetector CT imaging of the chest was performed during intravenous contrast administration. CONTRAST:  41m ISOVUE-300 IOPAMIDOL (ISOVUE-300) INJECTION 61% COMPARISON:  Same day chest radiographs FINDINGS: Cardiovascular: No significant vascular findings. Mild atherosclerosis of the aorta. Normal heart size. No pericardial effusion. Mediastinum/Nodes: No axillary, supraclavicular, mediastinal nor hilar lymphadenopathy. Small hiatal hernia. Lungs/Pleura: No evidence of bronchiectasis. A few small right apical pulmonary blebs are noted. Centrilobular emphysema, upper lobe  predominant. No pneumonic consolidation, effusion or CHF. Upper Abdomen: No acute abnormality. Musculoskeletal: No chest wall abnormality. No acute or significant osseous findings. IMPRESSION: Upper lobe predominant centrilobular emphysema. No acute cardiopulmonary disease. Aortic atherosclerosis. Electronically Signed   By: DAshley RoyaltyM.D.   On: 08/20/2016 17:43      Assessment/Plan Principal Problem:   Hemoptysis - no etiology found on CT scan- does not appear to have an infection.  - will request Pulmonary eval  Active Problems: Mild lactic acidosis, tachycardia - tachycardia resolved - recheck LA    Essential thrombocythemia  - on Agrylin per Dr GAlvy Bimler hold for now    Hypothyroidism - synthroid QHS    Anemia in chronic illness - stable    Essential hypertension - hold HCTZ, cont Lisinopril  Mild Hyponatremia/ dehydration - hold HCTZ for now    Anxiety - has not required Ativan lately    Diabetes mellitus type 2, uncomplicated  - hold Metformin and Amaryl for now- ISS   DVT prophylaxis: SCDs  Code Status: Full code  Family Communication:   Disposition Plan: admit to med surg  Consults called: pulmonary  Admission status: observation    REvantMD [facility] Pager: [email or web address] Password [email or web address] 7PM-7AM, please contact night-coverage   08/22/2016, 12:41 PM

## 2016-08-22 NOTE — Consult Note (Signed)
Name: Charles Bowman MRN: [id number] DOB: 1961-03-17    ADMISSION DATE:  08/22/2016 CONSULTATION DATE:  08/22/16  REFERRING MD :  Dr. Wynelle Cleveland   CHIEF COMPLAINT:  Hemoptysis    HISTORY OF PRESENT ILLNESS:  55 y/o M, former smoker (quit 2009) with PMH of anxiety, DM, HTN, heart murmur, hypothyroidism, anemia, and essential thrombocytosis (diagnosed in the 80's with a bone marrow biopsy, followed by Dr. [name], on Agrylin + ASA 81 mg) who presented to [facility] on 10/20 with hemoptysis.    Patient reports in 2009 he had a prior episode of hemoptysis. At that time he was found to have right upper lobe groundglass opacity. In the setting of hemoptysis, he underwent bronchoscopy with BAL of the right upper lobe and transbronchial biopsy (RUL) which was positive for Escherichia coli.  Cytology, fungal and AFB were negative.  At that time hemoptysis was thought related to smoking history, Goody powder use and Agrylin.  Right upper lobe infiltrate improved at time of follow-up (seen by Dr. Halford Chessman).  She reports symptoms began 10/17 early a.m. with 2 episodes of coughing up blood.  He describes it as if he "feels it coming up in his throat" and then he is able to cough it up. He called his primary oncologist and a chest x-ray was ordered which was clear. He denied any other systemic symptoms and resume activity as usual. After the chest x-ray was completed he discussed results with oncology. After he got off the phone he had a second episode of bleeding and called the office back.  A CT of the chest was ordered for the next day.  He had recurrent symptoms on Wednesday afternoon and proceeded to the ER for evaluation. At that time he underwent a CT scan of the chest which showed upper lobe predominant centrilobular emphysema but no other abnormality. Labs were evaluated and within normal limits. The patient was discharged home from the ER.  On 10/19, the patient woke up and felt tired and stayed home from work. Call  Dr. [name] for an update on his CT.  He subsequently had further episodes of hemoptysis (approximately 53-3 times) and he describes as bright red blood. Occasionally it will appear like clotted blood. He was able to feel the bottom of a urine specimen cup which is estimated approximately 20 mL's of fluid.  She was referred to the ER for evaluation.    Currently, he reports he has held his Gabriel Earing powders and aspirin since Monday of this week. He has not noted a change in the amount of bleeding. He reports dry mouth and occasional sneezing. No significant allergic symptoms. No noted sinus drainage but he typically has to blow his nose every am.  He previously had a sleep study in 2016 and required pressures of approximately 17 cm H2O (? If contributing to dryness).  No significant cough, fevers, chills, nausea, vomiting, pain, shortness of breath, pain with inspiration. He denies travel outside of the [place]. No known sick contacts.  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  PCCM consulted for evaluation of hemoptysis.     PAST MEDICAL HISTORY :   has a past medical history of Anemia; Anxiety (05/11/2014); Cancer ([place]); Diabetes mellitus without complication ([place]); Hypertension; Murmur (11/09/2014); and Thyroid disease.  has a past surgical history that includes Bone marrow biopsy and Bronchoscopy.   Prior to Admission medications   Medication Sig  Start Date End Date Taking? Authorizing Provider  anagrelide (AGRYLIN) 1 MG capsule TAKE 3 CAPSULES BY MOUTH DAILY. Patient taking differently: TAKE 2 IN THE  MORNING AND 1 IN THE EVENING 08/11/16  Yes Heath Lark, MD  aspirin 81 MG tablet Take 81 mg by mouth daily.   Yes Historical Provider, MD  atorvastatin (LIPITOR) 20 MG tablet Take 1 tablet (20 mg total) by mouth daily. NO MORE REFILLS WITHOUT OFFICE VISIT/ LABS - 2ND NOTICE 08/31/15  Yes Roselee Culver, MD  fluticasone [facility]) 50 MCG/ACT  nasal spray PLACE 2 SPRAYS IN EACH NOSTRIL EVERY DAY 09/18/15  Yes Jaynee Eagles, PA-C  glimepiride (AMARYL) 4 MG tablet TAKE 1 TABLET (4 MG TOTAL) BY MOUTH DAILY BEFORE BREAKFAST.  "NO MORE REFILLS WITHOUT OV" Patient taking differently: 4 mg. TAKE 1 TABLET (4 MG TOTAL) BY MOUTH DAILY BEFORE BREAKFAST.  "NO MORE REFILLS WITHOUT OV" 06/03/15  Yes Ezekiel Slocumb, PA-C  hydrochlorothiazide (MICROZIDE) 12.5 MG capsule TAKE ONE CAPSULE BY MOUTH EVERY DAY 02/14/16  Yes Jaynee Eagles, PA-C  lisinopril (PRINIVIL,ZESTRIL) 20 MG tablet Take 20 mg by mouth daily.  05/12/16  Yes Historical Provider, MD  LORazepam (ATIVAN) 1 MG tablet TAKE 1 TABLET BY MOUTH 3 TIMES A DAY AS NEEDED FOR ANXIETY 01/25/15  Yes Roselee Culver, MD  MATZIM LA 420 MG 24 hr tablet TAKE 1 TABLET (420 MG TOTAL) BY MOUTH DAILY. 03/18/16  Yes Jaynee Eagles, PA-C  metFORMIN (GLUCOPHAGE-XR) 500 MG 24 hr tablet TAKE 4 TABLETS BY MOUTH EVERY DAY  "OFFICE VISIT NEEDED" Patient taking differently: Take 1,000 mg by mouth 2 (two) times daily.  09/16/15  Yes Roselee Culver, MD  Multiple Vitamins-Minerals (MULTIVITAMIN WITH MINERALS) tablet Take 1 tablet by mouth daily.   Yes Historical Provider, MD  SYNTHROID 175 MCG tablet TAKE 1 TABLET (175 MCG TOTAL) BY MOUTH DAILY  "NO MORE REFILLS WITHOUT OFFICE VISIT" 10/16/15  Yes Roselee Culver, MD  glucose blood (ONE TOUCH ULTRA TEST) test strip Use as instructed 11/15/13   Barton Fanny, MD  Lancets [facility] ULTRASOFT) lancets Use as instructed 11/15/13   Barton Fanny, MD   No Known Allergies  FAMILY HISTORY:  family history includes COPD in his mother; Cancer in his father; Dementia (age of onset: 23) in his father.   SOCIAL HISTORY:  reports that he quit smoking about 7 years ago. His smoking use included Cigarettes. He has a 30.00 pack-year smoking history. He has never used smokeless tobacco. He reports that he does not drink alcohol or use drugs.  REVIEW OF SYSTEMS:  POSITIVES IN  BOLD Constitutional: Negative for fever, chills, weight loss, malaise/fatigue and diaphoresis.  HENT: Negative for hearing loss, ear pain, nosebleeds, congestion, sore throat, neck pain, tinnitus and ear discharge.   Eyes: Negative for blurred vision, double vision, photophobia, pain, discharge and redness.  Respiratory: Negative for cough, hemoptysis, sputum production, shortness of breath, wheezing and stridor.   Cardiovascular: Negative for chest pain, palpitations, orthopnea, claudication, leg swelling and PND.  Gastrointestinal: Negative for heartburn, nausea, vomiting, abdominal pain, diarrhea, constipation, blood in stool and melena.  Genitourinary: Negative for dysuria, urgency, frequency, hematuria and flank pain.  Musculoskeletal: Negative for myalgias, back pain, joint pain and falls.  Skin: Negative for itching and rash.  Neurological: Negative for dizziness, tingling, tremors, sensory change, speech change, focal weakness, seizures, loss of consciousness, weakness and headaches.  Endo/Heme/Allergies: Negative for environmental allergies and polydipsia. Does not bruise/bleed easily.  SUBJECTIVE:  VITAL SIGNS: Temp:  [98 F (36.7 C)-98.1 F (36.7 C)] 98.1 F (36.7 C) (10/20 1247) Pulse Rate:  [92-113] 97 (10/20 1247) Resp:  [14-18] 18 (10/20 1247) BP: (135-164)/(78-88) 144/78 (10/20 1247) SpO2:  [91 %-97 %] 94 % (10/20 1247) Weight:  [192 lb 1.6 oz (87.1 kg)-195 lb (88.5 kg)] 192 lb 1.6 oz (87.1 kg) (10/20 1247)  PHYSICAL EXAMINATION: General:  Well developed male in NAD Neuro:  AAOx4, speech clear, MAE  HEENT:  MM pink/moist, no jvd Cardiovascular:  s1s2 rrr, no m/r/g  Lungs:  Even/non-labored, lungs bilaterally clear Abdomen:  Obese/soft, bsx4 active  Musculoskeletal:  No acute deformities  Skin:  Warm/dry, no edema    Recent Labs Lab 08/20/16 1605 08/22/16 0950  NA 136 134*  K 4.1 4.3  CL 101 98*  CO2 26 26  BUN 21* 23*  CREATININE 0.99 1.12  GLUCOSE 151*  270*    Recent Labs Lab 08/20/16 1605 08/22/16 0950  HGB 13.0 13.8  HCT 40.2 40.8  WBC 5.3 5.9  PLT 196 222   Ct Chest W Contrast  Result Date: 08/20/2016 CLINICAL DATA:  Hemoptysis and history of thrombocytopenia EXAM: CT CHEST WITH CONTRAST TECHNIQUE: Multidetector CT imaging of the chest was performed during intravenous contrast administration. CONTRAST:  82m ISOVUE-300 IOPAMIDOL (ISOVUE-300) INJECTION 61% COMPARISON:  Same day chest radiographs FINDINGS: Cardiovascular: No significant vascular findings. Mild atherosclerosis of the aorta. Normal heart size. No pericardial effusion. Mediastinum/Nodes: No axillary, supraclavicular, mediastinal nor hilar lymphadenopathy. Small hiatal hernia. Lungs/Pleura: No evidence of bronchiectasis. A few small right apical pulmonary blebs are noted. Centrilobular emphysema, upper lobe predominant. No pneumonic consolidation, effusion or CHF. Upper Abdomen: No acute abnormality. Musculoskeletal: No chest wall abnormality. No acute or significant osseous findings. IMPRESSION: Upper lobe predominant centrilobular emphysema. No acute cardiopulmonary disease. Aortic atherosclerosis. Electronically Signed   By: DAshley RoyaltyM.D.   On: 08/20/2016 17:43   SIGNIFICANT EVENTS  10/20  Admit with hemoptysis   STUDIES:  CT chest 10/18 >> Upper lobe predominant centrilobular emphysema. No acute cardiopulmonary disease. Aortic atherosclerosis.  ASSESSMENT / PLAN:  Mild Hemoptysis - patient carries a history of hemoptysis with prior bronchoscopy in 2009 that was negative for malignancy, AFB and fungal elements. At baseline he is on Agrylin (essential thrombocytosis), ASA and Goody powders.  I am hopeful that this is airway irritation (cough, increased Goody powder use, dryness / seasonal allergies ?) with bleeding in the setting of anti-platelets. However, must also consider aspiration, GI source bleeding (doubt), epistaxis, COPD, vascular disorders, malignancy (doubt)  and infection.  Given the absence of findings on CT chest on 10/18, we will assess CTA chest to review vessels in the event he needs embolization.  Plan: Hold Agrylin, ASA, Goody Powders  Monitor at least 24 hours off therapy STAT CT angio chest to assess vessels in the event he needs IR embolization  NS 150 ml/hr x 1 liter (pre-med) with contrast No role for FOB at this time  Consider ENT evaluation of upper airway to review for bleeding  Nasal saline for moisture Monitor volume of bleeding  Attending to follow.  Thank you for the consultation.  We will follow along with you.    BNoe Gens NP-C [facility] Pgr: [phone] or if no answer [phone]/20/2017, 3:05 PM  ATTENDING NOTE / ATTESTATION NOTE :   I have discussed the case with the resident/APP  BNoe Gens   I agree with the resident/APP's  history, physical examination, assessment,  and plans.    I have edited the above note and modified it according to our agreed history, physical examination, assessment and plan.   Briefly, pt known to have ET (Essential Thrombocytosis), coming in with persistent hemoptysis.  Pt takes agrylin and ASA chronically for ET. Also takes Goody powder for chronic pain.  He had an episode of hemoptysis (not as severe as now) in 2009. Chest Ct scan in 2009 showed Right upper lobe infiltrate. He had bronchoscopy which showed infection as a cause of hemoptysis. Improved with antibiotics. No recurrence of hemoptysis until this week. Since that episode in 2009, he remains on aspirin and Agrylin for his ET. He also takes goody powder for chronic pain.   He denies any unusual events proceeding hemoptysis. Denies fevers, chills, dyspnea. Has been having hemoptysis, since 3 days now. At the end of a day, he would cough 25 mls of frank bllod at least.  He went to the ER on 10/17 and workup was unremarkable. Chest CT scan with contrast was unremarkable except for COPD. Hemoptysis  persisted so he was advised to go to the emergency room today. He has been taking more Goody powder to last week. He stopped aspirin and Goody powder since Monday.  Denies sinus issues/congestion, GERD sx. (-) recent sick contacts. No new meds.   Pt examined. Seen comfortable, NAD. 145/78, heart rate 98, respiratory rate 20, sats 94% on room air. Crowded airway. Good ae. CTA. Good s1/s2. (-) s3/m/r/g. (+) BS, soft, NT. (-) edema/ clubbing /cyanosis. Rest of exam per Brandi.   Labs reviewed and normal. CXR with no obvious infiltrate.   Assessment : Hemoptysis > during the tail end of my interview, pt started coughing up frank bloody sputum, amounting to 25 mls.  Differentials : 1. Related to meds with ET (Aspirin, Agrylin) and recent more [employer] use.  2. Pt denies UACS/Sinus infection.  3. Pt denies GERD. Denies vomiting/nausea.  4. Possible infection (bronchitis). 5. Vasculopathy 6. Less likely pulm edema.   Plan : 1. Plan for Chest CTA stat. I extensively discussed the case with interventional radiologist Dr. Earleen Newport. He will sign out to IR on call. They will follow-up chest CT scan. Patient may end up needing embolization if with persistent hemoptysis. Obviously,  if he has persistent hemoptysis, he may need intubation to protect his airway. 2. Transfer to stepdown unit. I discussed the case with tried hospitalist. 3. We'll give IV fluids now. Patient recently received contrast and will be getting contrast today for chest CT scan. 4. Antibiotics, broad-spectrum, depending on chest CTA. 5. Hold off on Agrylin, aspirin. 6. Will check echo to  be throrough 7. Will order blood type and screen and place 2u pRBC on hold just in case.  8. May need to hold off on BP meds if BP starts dropping.  9. Patient has obstructive sleep apnea. This needs to be addressed as an outpatient. He wants to be on CPAP. Likely will need a repeat sleep study. He had a sleep study in 2016 but was not able to  get CPAP as his insurance did not cover it.   Family : No family at bedside. Plan discussed with pt.  Plan d/w [place], MD 08/22/2016, 4:11 PM [facility] Pager [phone] After 3 pm or if no answer, call [phone]

## 2016-08-22 NOTE — H&P (Addendum)
History and Physical    PRATHER FAILLA HMC:[id number] DOB: [birth year] DOA: 08/22/2016    PCP: [name], DO  Patient coming from: home  Chief Complaint: coughing up blood  HPI: Charles Bowman is a 55 y.o. male with medical history significant of essential thrombocytosis, DM, HTN, hypothyroid who presents with hemoptysis for 4 days. He has a cough only when he feels that he needs to cough up blood. He is having a few episodes a day. No fever, chills sweats. No chest pain, sore throat or post nasal drip. He stopped his baby aspirin a couple of weeks ago but was taking BC powders which he stopped on Tuesday. He underwent a CT chest on 10/18 which showed emphysema.   ED Course: mild tachycardia, sodium 134, BUN 23, Cr 1.12, Lactic acid 2.78  Review of Systems:  All other systems reviewed and apart from HPI, are negative.  Past Medical History:  Diagnosis Date  . Anemia   . Anxiety 05/11/2014  . Cancer ([place])   . Diabetes mellitus without complication ([place])   . Hypertension   . Murmur 11/09/2014  . Thyroid disease     Past Surgical History:  Procedure Laterality Date  . BONE MARROW BIOPSY    . BRONCHOSCOPY      Social History:   reports that he quit smoking about 7 years ago. His smoking use included Cigarettes. He has a 30.00 pack-year smoking history. He has never used smokeless tobacco. He reports that he does not drink alcohol or use drugs.  No Known Allergies  Family History  Problem Relation Age of Onset  . Cancer Father     prostate ca  . Dementia Father 68    2008  . COPD Mother     +Tobacco Use     Prior to Admission medications   Medication Sig Start Date End Date Taking? Authorizing Provider  anagrelide (AGRYLIN) 1 MG capsule TAKE 3 CAPSULES BY MOUTH DAILY. Patient taking differently: TAKE 2 IN THE  MORNING AND 1 IN THE EVENING 08/11/16  Yes Heath Lark, MD  aspirin 81 MG tablet Take 81 mg by mouth daily.   Yes Historical Provider, MD  atorvastatin  (LIPITOR) 20 MG tablet Take 1 tablet (20 mg total) by mouth daily. NO MORE REFILLS WITHOUT OFFICE VISIT/ LABS - 2ND NOTICE 08/31/15  Yes Roselee Culver, MD  fluticasone [facility]) 50 MCG/ACT nasal spray PLACE 2 SPRAYS IN EACH NOSTRIL EVERY DAY 09/18/15  Yes Jaynee Eagles, PA-C  glimepiride (AMARYL) 4 MG tablet TAKE 1 TABLET (4 MG TOTAL) BY MOUTH DAILY BEFORE BREAKFAST.  "NO MORE REFILLS WITHOUT OV" Patient taking differently: 4 mg. TAKE 1 TABLET (4 MG TOTAL) BY MOUTH DAILY BEFORE BREAKFAST.  "NO MORE REFILLS WITHOUT OV" 06/03/15  Yes Ezekiel Slocumb, PA-C  hydrochlorothiazide (MICROZIDE) 12.5 MG capsule TAKE ONE CAPSULE BY MOUTH EVERY DAY 02/14/16  Yes Jaynee Eagles, PA-C  lisinopril (PRINIVIL,ZESTRIL) 20 MG tablet Take 20 mg by mouth daily.  05/12/16  Yes Historical Provider, MD  LORazepam (ATIVAN) 1 MG tablet TAKE 1 TABLET BY MOUTH 3 TIMES A DAY AS NEEDED FOR ANXIETY 01/25/15  Yes Roselee Culver, MD  MATZIM LA 420 MG 24 hr tablet TAKE 1 TABLET (420 MG TOTAL) BY MOUTH DAILY. 03/18/16  Yes Jaynee Eagles, PA-C  metFORMIN (GLUCOPHAGE-XR) 500 MG 24 hr tablet TAKE 4 TABLETS BY MOUTH EVERY DAY  "OFFICE VISIT NEEDED" Patient taking differently: Take 1,000 mg by mouth 2 (two) times daily.  09/16/15  Yes Roselee Culver, MD  Multiple Vitamins-Minerals (MULTIVITAMIN WITH MINERALS) tablet Take 1 tablet by mouth daily.   Yes Historical Provider, MD  SYNTHROID 175 MCG tablet TAKE 1 TABLET (175 MCG TOTAL) BY MOUTH DAILY  "NO MORE REFILLS WITHOUT OFFICE VISIT" 10/16/15  Yes Roselee Culver, MD  glucose blood (ONE TOUCH ULTRA TEST) test strip Use as instructed 11/15/13   Barton Fanny, MD  Lancets [facility] ULTRASOFT) lancets Use as instructed 11/15/13   Barton Fanny, MD    Physical Exam: Vitals:   08/22/16 1044 08/22/16 1130 08/22/16 1215 08/22/16 1247  BP: 160/85 135/84 161/84 (!) 144/78  Pulse: 107 98 92 97  Resp: '18 15 15 18  ' Temp:    98.1 F (36.7 C)  TempSrc:    Oral  SpO2: 94% 91% 97% 94%    Weight:    87.1 kg (192 lb 1.6 oz)  Height:    '5\' 7"'  (1.702 m)      Constitutional: NAD, calm, comfortable Eyes: PERTLA, lids and conjunctivae normal ENMT: Mucous membranes are moist. Posterior pharynx clear of any exudate or lesions. Normal dentition.  Neck: normal, supple, no masses, no thyromegaly Respiratory: clear to auscultation bilaterally, no wheezing, no crackles. Normal respiratory effort. No accessory muscle use.  Cardiovascular: S1 & S2 heard, regular rate and rhythm, no murmurs / rubs / gallops. No extremity edema. 2+ pedal pulses. No carotid bruits.  Abdomen: No distension, no tenderness, no masses palpated. No hepatosplenomegaly. Bowel sounds normal.  Musculoskeletal: no clubbing / cyanosis. No joint deformity upper and lower extremities. Good ROM, no contractures. Normal muscle tone.  Skin: no rashes, lesions, ulcers. No induration Neurologic: CN 2-12 grossly intact. Sensation intact, DTR normal. Strength 5/5 in all 4 limbs.  Psychiatric: Normal judgment and insight. Alert and oriented x 3. Normal mood.     Labs on Admission: I have personally reviewed following labs and imaging studies  CBC:  Recent Labs Lab 08/20/16 1605 08/22/16 0950  WBC 5.3 5.9  NEUTROABS 3.7  --   HGB 13.0 13.8  HCT 40.2 40.8  MCV 82.4 79.8  PLT 196 160   Basic Metabolic Panel:  Recent Labs Lab 08/20/16 1605 08/22/16 0950  NA 136 134*  K 4.1 4.3  CL 101 98*  CO2 26 26  GLUCOSE 151* 270*  BUN 21* 23*  CREATININE 0.99 1.12  CALCIUM 9.2 9.6   GFR: Estimated Creatinine Clearance: 81.3 mL/min (by C-G formula based on SCr of 1.12 mg/dL). Liver Function Tests:  Recent Labs Lab 08/20/16 1605 08/22/16 0950  AST 27 29  ALT 38 39  ALKPHOS 92 97  BILITOT 0.7 0.8  PROT 7.4 7.9  ALBUMIN 4.3 4.5   No results for input(s): LIPASE, AMYLASE in the last 168 hours. No results for input(s): AMMONIA in the last 168 hours. Coagulation Profile:  Recent Labs Lab 08/22/16 0954   INR 0.94   Cardiac Enzymes: No results for input(s): CKTOTAL, CKMB, CKMBINDEX, TROPONINI in the last 168 hours. BNP (last 3 results) No results for input(s): PROBNP in the last 8760 hours. HbA1C: No results for input(s): HGBA1C in the last 72 hours. CBG: No results for input(s): GLUCAP in the last 168 hours. Lipid Profile: No results for input(s): CHOL, HDL, LDLCALC, TRIG, CHOLHDL, LDLDIRECT in the last 72 hours. Thyroid Function Tests: No results for input(s): TSH, T4TOTAL, FREET4, T3FREE, THYROIDAB in the last 72 hours. Anemia Panel: No results for input(s): VITAMINB12, FOLATE, FERRITIN, TIBC, IRON, RETICCTPCT in the last 72  hours. Urine analysis:    Component Value Date/Time   COLORURINE YELLOW 08/28/2011 0913   APPEARANCEUR CLEAR 08/28/2011 0913   LABSPEC 1.006 08/28/2011 0913   PHURINE 6.5 08/28/2011 0913   GLUCOSEU NEGATIVE 08/28/2011 0913   HGBUR NEGATIVE 08/28/2011 0913   BILIRUBINUR NEGATIVE 08/28/2011 0913   KETONESUR NEGATIVE 08/28/2011 0913   PROTEINUR NEGATIVE 08/28/2011 0913   UROBILINOGEN 0.2 08/28/2011 0913   NITRITE NEGATIVE 08/28/2011 0913   LEUKOCYTESUR NEGATIVE 08/28/2011 0913   Sepsis Labs: '@LABRCNTIP' (procalcitonin:4,lacticidven:4) )No results found for this or any previous visit (from the past 240 hour(s)).   Radiological Exams on Admission: Ct Chest W Contrast  Result Date: 08/20/2016 CLINICAL DATA:  Hemoptysis and history of thrombocytopenia EXAM: CT CHEST WITH CONTRAST TECHNIQUE: Multidetector CT imaging of the chest was performed during intravenous contrast administration. CONTRAST:  89m ISOVUE-300 IOPAMIDOL (ISOVUE-300) INJECTION 61% COMPARISON:  Same day chest radiographs FINDINGS: Cardiovascular: No significant vascular findings. Mild atherosclerosis of the aorta. Normal heart size. No pericardial effusion. Mediastinum/Nodes: No axillary, supraclavicular, mediastinal nor hilar lymphadenopathy. Small hiatal hernia. Lungs/Pleura: No evidence of  bronchiectasis. A few small right apical pulmonary blebs are noted. Centrilobular emphysema, upper lobe predominant. No pneumonic consolidation, effusion or CHF. Upper Abdomen: No acute abnormality. Musculoskeletal: No chest wall abnormality. No acute or significant osseous findings. IMPRESSION: Upper lobe predominant centrilobular emphysema. No acute cardiopulmonary disease. Aortic atherosclerosis. Electronically Signed   By: DAshley RoyaltyM.D.   On: 08/20/2016 17:43      Assessment/Plan Principal Problem:   Hemoptysis - no etiology found on CT scan- does not appear to have an infection.  - will request Pulmonary eval ADDENDEM: spoke with Dr dCorrie Dandyof Pulm- recommended CTA and transfer to SDU- he has spoken with IR as well  Active Problems: Mild lactic acidosis, tachycardia - tachycardia resolved - recheck LA    Essential thrombocythemia  - on Agrylin per Dr GAlvy Bimler hold for now  OSA - does not wear CPAP    Hypothyroidism - synthroid QHS    Anemia in chronic illness - stable    Essential hypertension - hold HCTZ, cont Lisinopril  Mild Hyponatremia/ dehydration - hold HCTZ for now    Anxiety - has not required Ativan lately    Diabetes mellitus type 2, uncomplicated  - hold Metformin and Amaryl for now- ISS   DVT prophylaxis: SCDs  Code Status: Full code  Family Communication:   Disposition Plan: admit to med surg  Consults called: pulmonary  Admission status: observation    RGpddc LLCMD [facility] Pager: [email or web address] Password [email or web address] 7PM-7AM, please contact night-coverage   08/22/2016, 1:12 PM

## 2016-08-22 NOTE — Progress Notes (Signed)
eLink Physician-Brief Progress Note Patient Name: Charles Bowman DOB: 1961-09-22 MRN: [id number]   Date of Service  08/22/2016  HPI/Events of Note  Chest CTA c/w possible pneumonia.  eICU Interventions  Will order: 1. Vancomycin per pharmacy consult. 2. Zosyn per pharmacy consult.     Intervention Category Intermediate Interventions: Infection - evaluation and management  [name] 08/22/2016, 6:58 PM

## 2016-08-22 NOTE — ED Notes (Signed)
11"58 pt can go to floor.

## 2016-08-23 ENCOUNTER — Observation Stay (HOSPITAL_BASED_OUTPATIENT_CLINIC_OR_DEPARTMENT_OTHER): Payer: 59

## 2016-08-23 DIAGNOSIS — R0600 Dyspnea, unspecified: Secondary | ICD-10-CM | POA: Diagnosis not present

## 2016-08-23 DIAGNOSIS — K920 Hematemesis: Secondary | ICD-10-CM | POA: Diagnosis present

## 2016-08-23 DIAGNOSIS — E039 Hypothyroidism, unspecified: Secondary | ICD-10-CM | POA: Diagnosis present

## 2016-08-23 DIAGNOSIS — J181 Lobar pneumonia, unspecified organism: Secondary | ICD-10-CM | POA: Diagnosis present

## 2016-08-23 DIAGNOSIS — J9601 Acute respiratory failure with hypoxia: Secondary | ICD-10-CM | POA: Diagnosis not present

## 2016-08-23 DIAGNOSIS — I10 Essential (primary) hypertension: Secondary | ICD-10-CM | POA: Diagnosis not present

## 2016-08-23 DIAGNOSIS — Z87891 Personal history of nicotine dependence: Secondary | ICD-10-CM | POA: Diagnosis not present

## 2016-08-23 DIAGNOSIS — D693 Immune thrombocytopenic purpura: Secondary | ICD-10-CM | POA: Diagnosis present

## 2016-08-23 DIAGNOSIS — E860 Dehydration: Secondary | ICD-10-CM | POA: Diagnosis present

## 2016-08-23 DIAGNOSIS — Z7982 Long term (current) use of aspirin: Secondary | ICD-10-CM | POA: Diagnosis not present

## 2016-08-23 DIAGNOSIS — I2699 Other pulmonary embolism without acute cor pulmonale: Secondary | ICD-10-CM | POA: Diagnosis present

## 2016-08-23 DIAGNOSIS — E872 Acidosis: Secondary | ICD-10-CM | POA: Diagnosis present

## 2016-08-23 DIAGNOSIS — G4733 Obstructive sleep apnea (adult) (pediatric): Secondary | ICD-10-CM | POA: Diagnosis present

## 2016-08-23 DIAGNOSIS — E119 Type 2 diabetes mellitus without complications: Secondary | ICD-10-CM | POA: Diagnosis not present

## 2016-08-23 DIAGNOSIS — F419 Anxiety disorder, unspecified: Secondary | ICD-10-CM | POA: Diagnosis present

## 2016-08-23 DIAGNOSIS — J440 Chronic obstructive pulmonary disease with acute lower respiratory infection: Secondary | ICD-10-CM | POA: Diagnosis present

## 2016-08-23 DIAGNOSIS — R042 Hemoptysis: Secondary | ICD-10-CM | POA: Diagnosis not present

## 2016-08-23 DIAGNOSIS — D638 Anemia in other chronic diseases classified elsewhere: Secondary | ICD-10-CM | POA: Diagnosis not present

## 2016-08-23 DIAGNOSIS — E871 Hypo-osmolality and hyponatremia: Secondary | ICD-10-CM | POA: Diagnosis present

## 2016-08-23 DIAGNOSIS — R000 Tachycardia, unspecified: Secondary | ICD-10-CM | POA: Diagnosis present

## 2016-08-23 DIAGNOSIS — Z7984 Long term (current) use of oral hypoglycemic drugs: Secondary | ICD-10-CM | POA: Diagnosis not present

## 2016-08-23 LAB — GLUCOSE, CAPILLARY
Glucose-Capillary: 233 mg/dL — ABNORMAL HIGH (ref 65–99)
Glucose-Capillary: 167 mg/dL — ABNORMAL HIGH (ref 65–99)
Glucose-Capillary: 245 mg/dL — ABNORMAL HIGH (ref 65–99)
Glucose-Capillary: 122 mg/dL — ABNORMAL HIGH (ref 65–99)

## 2016-08-23 LAB — ECHOCARDIOGRAM COMPLETE
Height: 67 in
Weight: 3082.91 oz

## 2016-08-23 LAB — CBC
HCT: 36.7 % — ABNORMAL LOW (ref 39.0–52.0)
Hemoglobin: 11.8 g/dL — ABNORMAL LOW (ref 13.0–17.0)
MCH: 26.5 pg (ref 26.0–34.0)
MCHC: 32.2 g/dL (ref 30.0–36.0)
MCV: 82.3 fL (ref 78.0–100.0)
Platelets: 193 10*3/uL (ref 150–400)
RBC: 4.46 MIL/uL (ref 4.22–5.81)
RDW: 15.7 % — AB (ref 11.5–15.5)
WBC: 4.6 10*3/uL (ref 4.0–10.5)

## 2016-08-23 LAB — BASIC METABOLIC PANEL
ANION GAP: 7 (ref 5–15)
BUN: 19 mg/dL (ref 6–20)
CHLORIDE: 103 mmol/L (ref 101–111)
CO2: 27 mmol/L (ref 22–32)
Calcium: 8.8 mg/dL — ABNORMAL LOW (ref 8.9–10.3)
Creatinine, Ser: 0.93 mg/dL (ref 0.61–1.24)
GFR calc non Af Amer: >60 mL/min (ref >60)
Glucose, Bld: 141 mg/dL — ABNORMAL HIGH (ref 65–99)
POTASSIUM: 4.3 mmol/L (ref 3.5–5.1)
SODIUM: 137 mmol/L (ref 135–145)

## 2016-08-23 MED ORDER — SODIUM CHLORIDE 0.9 % IV SOLN
INTRAVENOUS | Status: DC
  Administered 2016-08-23: 1000 mL via INTRAVENOUS

## 2016-08-23 MED ORDER — HYDRALAZINE HCL 20 MG/ML IJ SOLN
10.0000 mg | Freq: Four times a day (QID) | INTRAMUSCULAR | Status: DC | PRN
  Administered 2016-08-23 – 2016-08-24 (×2): 10 mg via INTRAVENOUS
  Filled 2016-08-23 (×2): qty 1

## 2016-08-23 MED ORDER — HYDROCHLOROTHIAZIDE 12.5 MG PO CAPS
12.5000 mg | ORAL_CAPSULE | Freq: Every day | ORAL | Status: DC
Start: 2016-08-23 — End: 2016-08-29
  Administered 2016-08-23 – 2016-08-29 (×7): 12.5 mg via ORAL
  Filled 2016-08-23 (×7): qty 1

## 2016-08-23 NOTE — Progress Notes (Signed)
PCCM PROGRESS NOTE  Admission date: 08/22/2016 Consult date: 08/22/16 Referring provider: Dr. Wynelle Cleveland  CC: hemoptysis  Subjective: Coughed up blood twice this morning.  Denies nose bleed, chest pain.  Vital signs: BP (!) 177/74   Pulse 82   Temp 98 F (36.7 C) (Oral)   Resp (!) 25   Ht 5\' 7"  (1.702 m)   Wt 192 lb 10.9 oz (87.4 kg)   SpO2 95%   BMI 30.18 kg/m   Intake/output: I/O last 3 completed shifts: In: [id number] [P.O.:880; I.V.:300; IV Piggyback:600] Out: 970 [Urine:935; Emesis/NG output:35]  General: alert Neuro: follows commands HEENT: no oral lesions Cardiac: regular, no murmur Chest: no wheeze/rales Abd: soft, non tender Ext: no edema Skin: no rashes   CMP Latest Ref Rng & Units 08/23/2016 08/22/2016 08/20/2016  Glucose 65 - 99 mg/dL 141(H) 270(H) 151(H)  BUN 6 - 20 mg/dL 19 23(H) 21(H)  Creatinine 0.61 - 1.24 mg/dL 0.93 1.12 0.99  Sodium 135 - 145 mmol/L 137 134(L) 136  Potassium 3.5 - 5.1 mmol/L 4.3 4.3 4.1  Chloride 101 - 111 mmol/L 103 98(L) 101  CO2 22 - 32 mmol/L 27 26 26   Calcium 8.9 - 10.3 mg/dL 8.8(L) 9.6 9.2  Total Protein 6.5 - 8.1 g/dL - 7.9 7.4  Total Bilirubin 0.3 - 1.2 mg/dL - 0.8 0.7  Alkaline Phos 38 - 126 U/L - 97 92  AST 15 - 41 U/L - 29 27  ALT 17 - 63 U/L - 39 38    CBC Latest Ref Rng & Units 08/23/2016 08/22/2016 08/20/2016  WBC 4.0 - 10.5 K/uL 4.6 5.9 5.3  Hemoglobin 13.0 - 17.0 g/dL 11.8(L) 13.8 13.0  Hematocrit 39.0 - 52.0 % 36.7(L) 40.8 40.2  Platelets 150 - 400 K/uL 193 222 196    CBG (last 3)   Recent Labs  08/22/16 2128 08/23/16 0832 08/23/16 1227  GLUCAP 122* 233* 167*    Imaging: Ct Angio Chest Pe W Or Wo Contrast  Result Date: 08/22/2016 CLINICAL DATA:  Hemoptysis.  Thrombocytosis. EXAM: CT ANGIOGRAPHY CHEST WITH CONTRAST TECHNIQUE: Multidetector CT imaging of the chest was performed using the standard protocol during bolus administration of intravenous contrast. Multiplanar CT image reconstructions and MIPs  were obtained to evaluate the vascular anatomy. CONTRAST:  100 cc Isovue 370 COMPARISON:  CT chest from 2 days ago, 08/20/2016 FINDINGS: Cardiovascular: No filling defect is identified in the pulmonary arterial tree to suggest pulmonary embolus. Mild atherosclerotic calcification of the aortic arch. Mediastinum/Nodes: No obvious esophageal abnormality. No adenopathy. Lungs/Pleura: Centrilobular emphysema. Mosaic attenuation in the lower lobes and right middle lobe and to a lesser extent posteriorly in the right upper lobe, subtle sub solid nodularity in a centrilobular distribution. Upper Abdomen: Unremarkable Musculoskeletal: Unremarkable Review of the MIP images confirms the above findings. IMPRESSION: 1. No filling defect is identified in the pulmonary arterial tree to suggest pulmonary embolus. 2. Very faint centrilobular sub solid nodularity causing a mosaic attenuation appearance primarily in the right middle lobe and in the lower lobes. Differential diagnostic considerations favor hypersensitivity pneumonitis, respiratory bronchiolitis, or atypical infectious process. 3. Centrilobular emphysema. Electronically Signed   By: Van Clines M.D.   On: 08/22/2016 18:13   Dg Chest Port 1 View  Result Date: 08/22/2016 CLINICAL DATA:  Hemoptysis. EXAM: PORTABLE CHEST 1 VIEW COMPARISON:  Radiographs and CT scan of August 20, 2016. FINDINGS: The heart size and mediastinal contours are within normal limits. Both lungs are clear. No pneumothorax or pleural effusion is noted. The visualized  skeletal structures are unremarkable. IMPRESSION: No acute cardiopulmonary abnormality seen. Electronically Signed   By: Marijo Conception, M.D.   On: 08/22/2016 15:52   Studies: CT angio chest 10/20 >> centrilobular emphysema, mosaic pattern b/l lower lobes and RML Echo 10/21 >>   Antibiotics: Vancomycin 10/20 >> Zosyn 10/20 >>  Summary: 55 yo male former smoker with hx of essential thrombocytosis on agrylin and  ASA presented with hemoptysis.  He had prior episode of hemoptysis in 2009 from E coli PNA.  Assessment/plan:  Hemoptysis with CT angio chest concerning for pneumonia. - Day 2 of vancomycin, zosyn - if hemoptysis persists, will then need bronchoscopy - f/u Echo - avoid Goody powder  Essential thrombocytopenia. - f/u CBC - hold ASA, agrylin for now  Hx of OSA. - reassess as outpt  Updated pt's family at bedside.  Chesley Mires, MD [facility] Pulmonary/Critical Care 08/23/2016, 1:00 PM Pager:  [phone] After 3pm call: [phone]

## 2016-08-23 NOTE — Progress Notes (Signed)
*  PRELIMINARY RESULTS* Echocardiogram 2D Echocardiogram has been performed.  Beryle Beams 08/23/2016, 1:10 PM

## 2016-08-23 NOTE — Progress Notes (Signed)
PROGRESS NOTE                                                                                                                                                                                                             Patient Demographics:    Charles Bowman, is a 55 y.o. male, DOB - Sep 25, 1961, TK:[id number]  Admit date - 08/22/2016   Admitting Physician [name], MD  Outpatient Primary MD for the patient is [name], DO  LOS - 0  Chief Complaint  Patient presents with  . Hematemesis       Brief Narrative   55 y.o. male with medical history significant of essential thrombocytosis, DM, HTN, hypothyroid who presents with hemoptysis   Subjective:    Charles Bowman today has, No headache, No chest pain, No abdominal pain - No Nausea, Reports hemoptysis this a.m.,   Assessment  & Plan :    Principal Problem:   Hemoptysis Active Problems:   Essential thrombocythemia ([place])   Hypothyroidism   Anemia in chronic illness   Essential hypertension   Anxiety   Diabetes mellitus type 2, uncomplicated (HCC)   Murmur    Hemoptysis - PCCM Input greatly appreciated, CTA chest obtained, no evidence of PE, faint abnormal finding in right middle lobe and lower lobes suspicious for pneumonitis, bronchiolitis or atypical infection. - Continue to hold aspirin and Agrylin - monitor in  stepdown per PCCM recommendation - Hemoglobin 13.8>11.8 today, I think this is mainly delutional given significant IV fluid over last 24 hours  Essential thrombocythemia  - on aspirin and Agrylin per Dr Lonna Cobb  hold for now  OSA - does not wear CPAP  Hypothyroidism - Continue with synthroid  Anemia in chronic illness - stable - Hemoglobin dropped from 13.8 on admission to 11.8,most likely related to dilutional effect  Essential hypertension - Controlled, already on lisinopril, resume home dose hydrochlorothiazide hold HCTZ, cont  Lisinopril  Mild Hyponatremia - Due to dehydration, resolved with IV fluid/ dehydration  Diabetes mellitus type 2, uncomplicated  - hold Metformin and Amaryl for now- ISS     Code Status : Full  Family Communication  : D/W patient, None at bedside  Disposition Plan  : Home when stable  Consults  :  PCCM  Procedures  : None  DVT Prophylaxis  :  SCDs  Lab Results  Component Value Date   PLT 193 08/23/2016    Antibiotics  :   Anti-infectives    Start     Dose/Rate Route Frequency Ordered Stop   08/23/16 0600  vancomycin (VANCOCIN) IVPB 1000 mg/200 mL premix     1,000 mg 200 mL/hr over 60 Minutes Intravenous Every 8 hours 08/22/16 1942     08/23/16 0200  piperacillin-tazobactam (ZOSYN) IVPB 3.375 g     3.375 g 12.5 mL/hr over 240 Minutes Intravenous Every 8 hours 08/22/16 1942     08/22/16 2000  vancomycin (VANCOCIN) 1,500 mg in sodium chloride 0.9 % 500 mL IVPB     1,500 mg 250 mL/hr over 120 Minutes Intravenous  Once 08/22/16 1942 08/22/16 2307   08/22/16 2000  piperacillin-tazobactam (ZOSYN) IVPB 3.375 g     3.375 g 100 mL/hr over 30 Minutes Intravenous  Once 08/22/16 1942 08/22/16 2059        Objective:   Vitals:   08/23/16 0600 08/23/16 0800 08/23/16 1023 08/23/16 1026  BP: (!) 150/67  (!) 160/73 (!) 160/73  Pulse: 65     Resp: 12     Temp:  98 F (36.7 C)    TempSrc:  Oral    SpO2: 94%     Weight:      Height:        Wt Readings from Last 3 Encounters:  08/22/16 87.4 kg (192 lb 10.9 oz)  08/20/16 89.1 kg (196 lb 6 oz)  05/23/16 89 kg (196 lb 1.6 oz)     Intake/Output Summary (Last 24 hours) at 08/23/16 1029 Last data filed at 08/23/16 0700  Gross per 24 hour  Intake             1780 ml  Output              970 ml  Net              810 ml     Physical Exam  Awake Alert, Oriented X 3, No new F.N deficits, Normal affect [place].AT,PERRAL Supple Neck,No JVD Symmetrical Chest wall movement, Good air movement bilaterally, CTAB RRR,No  Gallops,Rubs or new Murmurs, No Parasternal Heave +ve B.Sounds, Abd Soft, No tenderness,  No rebound - guarding or rigidity. No Cyanosis, Clubbing or edema, No new Rash or bruise      Data Review:    CBC  Recent Labs Lab 08/20/16 1605 08/22/16 0950 08/23/16 0326  WBC 5.3 5.9 4.6  HGB 13.0 13.8 11.8*  HCT 40.2 40.8 36.7*  PLT 196 222 193  MCV 82.4 79.8 82.3  MCH 26.6 27.0 26.5  MCHC 32.3 33.8 32.2  RDW 15.5 15.2 15.7*  LYMPHSABS 1.2  --   --   MONOABS 0.4  --   --   EOSABS 0.1  --   --   BASOSABS 0.0  --   --     Chemistries   Recent Labs Lab 08/20/16 1605 08/22/16 0950 08/23/16 0326  NA 136 134* 137  K 4.1 4.3 4.3  CL 101 98* 103  CO2 26 26 27   GLUCOSE 151* 270* 141*  BUN 21* 23* 19  CREATININE 0.99 1.12 0.93  CALCIUM 9.2 9.6 8.8*  AST 27 29  --   ALT 38 39  --   ALKPHOS 92 97  --   BILITOT 0.7 0.8  --    ------------------------------------------------------------------------------------------------------------------ No results for input(s): CHOL, HDL, LDLCALC, TRIG, CHOLHDL, LDLDIRECT in the last 72 hours.  Lab  Results  Component Value Date   HGBA1C 7.3 01/25/2015   ------------------------------------------------------------------------------------------------------------------ No results for input(s): TSH, T4TOTAL, T3FREE, THYROIDAB in the last 72 hours.  Invalid input(s): FREET3 ------------------------------------------------------------------------------------------------------------------ No results for input(s): VITAMINB12, FOLATE, FERRITIN, TIBC, IRON, RETICCTPCT in the last 72 hours.  Coagulation profile  Recent Labs Lab 08/22/16 0954  INR 0.94    No results for input(s): DDIMER in the last 72 hours.  Cardiac Enzymes No results for input(s): CKMB, TROPONINI, MYOGLOBIN in the last 168 hours.  Invalid input(s): CK ------------------------------------------------------------------------------------------------------------------     Component Value Date/Time   BNP 34.6 08/20/2016 1605    Inpatient Medications  Scheduled Meds: . sodium chloride   Intravenous Once  . atorvastatin  20 mg Oral Daily  . diltiazem  420 mg Oral Daily  . insulin aspart  0-15 Units Subcutaneous TID WC  . insulin aspart  0-5 Units Subcutaneous QHS  . levothyroxine  175 mcg Oral QHS  . lisinopril  20 mg Oral Daily  . piperacillin-tazobactam (ZOSYN)  IV  3.375 g Intravenous Q8H  . sodium chloride  1 spray Each Nare BID  . vancomycin  1,000 mg Intravenous Q8H   Continuous Infusions:  PRN Meds:.acetaminophen **OR** acetaminophen, hydrALAZINE, HYDROcodone-acetaminophen, ondansetron **OR** ondansetron (ZOFRAN) IV  Micro Results Recent Results (from the past 240 hour(s))  MRSA PCR Screening     Status: None   Collection Time: 08/22/16  6:50 PM  Result Value Ref Range Status   MRSA by PCR NEGATIVE NEGATIVE Final    Comment:        The GeneXpert MRSA Assay (FDA approved for NASAL specimens only), is one component of a comprehensive MRSA colonization surveillance program. It is not intended to diagnose MRSA infection nor to guide or monitor treatment for MRSA infections.     Radiology Reports Dg Chest 2 View  Result Date: 08/20/2016 CLINICAL DATA:  Hemoptysis. EXAM: CHEST  2 VIEW COMPARISON:  Radiographs of January 23, 2016. FINDINGS: The heart size and mediastinal contours are within normal limits. Both lungs are clear. No pneumothorax or pleural effusion is noted. The visualized skeletal structures are unremarkable. IMPRESSION: No active cardiopulmonary disease. Electronically Signed   By: Marijo Conception, M.D.   On: 08/20/2016 10:29   Ct Chest W Contrast  Result Date: 08/20/2016 CLINICAL DATA:  Hemoptysis and history of thrombocytopenia EXAM: CT CHEST WITH CONTRAST TECHNIQUE: Multidetector CT imaging of the chest was performed during intravenous contrast administration. CONTRAST:  53mL ISOVUE-300 IOPAMIDOL (ISOVUE-300) INJECTION  61% COMPARISON:  Same day chest radiographs FINDINGS: Cardiovascular: No significant vascular findings. Mild atherosclerosis of the aorta. Normal heart size. No pericardial effusion. Mediastinum/Nodes: No axillary, supraclavicular, mediastinal nor hilar lymphadenopathy. Small hiatal hernia. Lungs/Pleura: No evidence of bronchiectasis. A few small right apical pulmonary blebs are noted. Centrilobular emphysema, upper lobe predominant. No pneumonic consolidation, effusion or CHF. Upper Abdomen: No acute abnormality. Musculoskeletal: No chest wall abnormality. No acute or significant osseous findings. IMPRESSION: Upper lobe predominant centrilobular emphysema. No acute cardiopulmonary disease. Aortic atherosclerosis. Electronically Signed   By: Ashley Royalty M.D.   On: 08/20/2016 17:43   Ct Angio Chest Pe W Or Wo Contrast  Result Date: 08/22/2016 CLINICAL DATA:  Hemoptysis.  Thrombocytosis. EXAM: CT ANGIOGRAPHY CHEST WITH CONTRAST TECHNIQUE: Multidetector CT imaging of the chest was performed using the standard protocol during bolus administration of intravenous contrast. Multiplanar CT image reconstructions and MIPs were obtained to evaluate the vascular anatomy. CONTRAST:  100 cc Isovue 370 COMPARISON:  CT chest from 2 days  ago, 08/20/2016 FINDINGS: Cardiovascular: No filling defect is identified in the pulmonary arterial tree to suggest pulmonary embolus. Mild atherosclerotic calcification of the aortic arch. Mediastinum/Nodes: No obvious esophageal abnormality. No adenopathy. Lungs/Pleura: Centrilobular emphysema. Mosaic attenuation in the lower lobes and right middle lobe and to a lesser extent posteriorly in the right upper lobe, subtle sub solid nodularity in a centrilobular distribution. Upper Abdomen: Unremarkable Musculoskeletal: Unremarkable Review of the MIP images confirms the above findings. IMPRESSION: 1. No filling defect is identified in the pulmonary arterial tree to suggest pulmonary embolus. 2.  Very faint centrilobular sub solid nodularity causing a mosaic attenuation appearance primarily in the right middle lobe and in the lower lobes. Differential diagnostic considerations favor hypersensitivity pneumonitis, respiratory bronchiolitis, or atypical infectious process. 3. Centrilobular emphysema. Electronically Signed   By: Van Clines M.D.   On: 08/22/2016 18:13   Dg Chest Port 1 View  Result Date: 08/22/2016 CLINICAL DATA:  Hemoptysis. EXAM: PORTABLE CHEST 1 VIEW COMPARISON:  Radiographs and CT scan of August 20, 2016. FINDINGS: The heart size and mediastinal contours are within normal limits. Both lungs are clear. No pneumothorax or pleural effusion is noted. The visualized skeletal structures are unremarkable. IMPRESSION: No acute cardiopulmonary abnormality seen. Electronically Signed   By: Marijo Conception, M.D.   On: 08/22/2016 15:52     Suzanne Garbers M.D on 08/23/2016 at 10:29 AM  Between 7am to 7pm - Pager - [phone]  After 7pm go to [email or web address] - password [email or web address]  [facility] -  Office  [phone]

## 2016-08-24 ENCOUNTER — Inpatient Hospital Stay (HOSPITAL_COMMUNITY): Payer: 59

## 2016-08-24 LAB — BASIC METABOLIC PANEL
ANION GAP: 9 (ref 5–15)
BUN: 20 mg/dL (ref 6–20)
CHLORIDE: 99 mmol/L — AB (ref 101–111)
CO2: 28 mmol/L (ref 22–32)
Calcium: 9.7 mg/dL (ref 8.9–10.3)
Creatinine, Ser: 0.94 mg/dL (ref 0.61–1.24)
GFR calc Af Amer: >60 mL/min (ref >60)
GLUCOSE: 197 mg/dL — AB (ref 65–99)
POTASSIUM: 4.4 mmol/L (ref 3.5–5.1)
Sodium: 136 mmol/L (ref 135–145)

## 2016-08-24 LAB — CBC
HEMATOCRIT: 40.4 % (ref 39.0–52.0)
HEMOGLOBIN: 13.1 g/dL (ref 13.0–17.0)
MCH: 26.8 pg (ref 26.0–34.0)
MCHC: 32.4 g/dL (ref 30.0–36.0)
MCV: 82.8 fL (ref 78.0–100.0)
PLATELETS: 263 10*3/uL (ref 150–400)
RBC: 4.88 MIL/uL (ref 4.22–5.81)
RDW: 16.1 % — ABNORMAL HIGH (ref 11.5–15.5)
WBC: 8.2 10*3/uL (ref 4.0–10.5)

## 2016-08-24 LAB — GLUCOSE, CAPILLARY
GLUCOSE-CAPILLARY: 233 mg/dL — AB (ref 65–99)
GLUCOSE-CAPILLARY: 165 mg/dL — AB (ref 65–99)
GLUCOSE-CAPILLARY: 264 mg/dL — AB (ref 65–99)
Glucose-Capillary: 210 mg/dL — ABNORMAL HIGH (ref 65–99)

## 2016-08-24 MED ORDER — HYDRALAZINE HCL 20 MG/ML IJ SOLN
10.0000 mg | INTRAMUSCULAR | Status: DC | PRN
  Administered 2016-08-24 – 2016-08-25 (×3): 10 mg via INTRAVENOUS
  Filled 2016-08-24 (×3): qty 1

## 2016-08-24 MED ORDER — LISINOPRIL 20 MG PO TABS
40.0000 mg | ORAL_TABLET | Freq: Every day | ORAL | Status: DC
  Administered 2016-08-24 – 2016-08-29 (×6): 40 mg via ORAL
  Filled 2016-08-24 (×3): qty 4
  Filled 2016-08-24: qty 2
  Filled 2016-08-24 (×2): qty 4

## 2016-08-24 MED ORDER — CALCIUM CARBONATE ANTACID 500 MG PO CHEW
1.0000 | CHEWABLE_TABLET | Freq: Once | ORAL | Status: AC
  Administered 2016-08-24: 200 mg via ORAL
  Filled 2016-08-24: qty 1

## 2016-08-24 MED ORDER — ZOLPIDEM TARTRATE 5 MG PO TABS
5.0000 mg | ORAL_TABLET | Freq: Every evening | ORAL | Status: DC | PRN
  Administered 2016-08-24 – 2016-08-28 (×4): 5 mg via ORAL
  Filled 2016-08-24 (×4): qty 1

## 2016-08-24 NOTE — Progress Notes (Signed)
Pharmacy Antibiotic Note  Charles Bowman is a 55 y.o. 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 CTa chest reveals likely infiltrate.  Pharmacy has been consulted for vancomycin and Zosyn dosing.  Today, 08/24/2016  Day #2 antibiotics  Renal :SCr stable  WBC WNL  Afebrile  Plan for bronchoscopy for hemoptysis  Plan:  Vancomycin 1000 mg IV q8 hr; goal trough 15-20 mcg/mL  Measure vancomycin trough level in am  Zosyn 3.375 g IV q8 hrs by 4-hr infusion  Follow clinical course, renal function, culture results as available  Follow for de-escalation of antibiotics and LOT  MRSA PCR negative, consider stopping vancomycin as appropriate   Height: 5\' 7"  (170.2 cm) Weight: 192 lb 10.9 oz (87.4 kg) IBW/kg (Calculated) : 66.1  Temp (24hrs), Avg:98 F (36.7 C), Min:97 F (36.1 C), Max:98.4 F (36.9 C)   Recent Labs Lab 08/20/16 1605 08/22/16 0950 08/22/16 1017 08/22/16 1401 08/23/16 0326 08/24/16 0358  WBC 5.3 5.9  --   --  4.6 8.2  CREATININE 0.99 1.12  --   --  0.93 0.94  LATICACIDVEN  --   --  2.78* 1.7  --   --     Estimated Creatinine Clearance: 97 mL/min (by C-G formula based on SCr of 0.94 mg/dL).    No Known Allergies  Antimicrobials this admission: vancomycin 10/20 >>  Zosyn 10/20 >>    Dose adjustments this admission: 10/23 0530 VT =  __mcg/ml on vanco 1gm q8h (prior to 7th dose)   Microbiology results: 10/20 MRSA PCR: neg  Thank you for allowing pharmacy to be a part of this patient's care.  Doreene Eland, PharmD, BCPS.   Pager: [phone] 08/24/2016 1:48 PM

## 2016-08-24 NOTE — Progress Notes (Signed)
PROGRESS NOTE                                                                                                                                                                                                             Patient Demographics:    Charles Bowman, is a 55 y.o. male, DOB - July 06, 1961, [id number]  Admit date - 08/22/2016   Admitting Physician [name], MD  Outpatient Primary MD for the patient is [name], DO  LOS - 1  Chief Complaint  Patient presents with  . Hematemesis       Brief Narrative   55 y.o. male with medical history significant of essential thrombocytosis, DM, HTN, hypothyroid who presents with hemoptysis, Seen by PCCM.   Subjective:    Charles Bowman today has, No headache, No chest pain, No abdominal pain - No Nausea, Reports hemoptysis over the last 24 hours (reports 5 episodes ).   Assessment  & Plan :    Principal Problem:   Hemoptysis Active Problems:   Essential thrombocythemia ([place])   Hypothyroidism   Anemia in chronic illness   Essential hypertension   Anxiety   Diabetes mellitus type 2, uncomplicated (HCC)   Murmur    Hemoptysis With CT Andrew chest concerning for pneumonia - PCCM Input greatly appreciated, CTA chest obtained, no evidence of PE, Findings suspect concerning for pneumonia. - Continue to hold aspirin and Agrylin - monitor in  stepdown per PCCM recommendation - Pulmonary input greatly appreciated,Continue with IV vancomycin and Zosyn for pneumonia treatment, per PCCM if hemoptysis persists will need bronchoscopy.  Essential thrombocythemia  - on aspirin and Agrylin per Dr Lonna Cobb  hold for now, currently platelet counts within normal limits.  OSA - does not wear CPAP  Hypothyroidism - Continue with synthroid  Essential hypertension - Poorly Controlled, continue with home medication including lisinopril, hydrochlorothiazide and diltiazem , will increase  lisinopril from 20-40 mg oral daily . - 2-D echo significant for EF 45-50% with diffuse hypokinesis , D/W Dr Cathie Olden this is most likely due to poorly controlled blood pressure , and to follow with his primary cardiologist as an outpatient Dr.Hilty.  Mild Hyponatremia - Due to dehydration, resolved with IV fluid/ dehydration  Diabetes mellitus type 2, uncomplicated  - hold Metformin and Amaryl for now- ISS     Code Status : Full  Family Communication  : D/W patient, None at bedside  Disposition Plan  : Home when stable  Consults  :  PCCM  Procedures  : None  DVT Prophylaxis  :  SCDs   Lab Results  Component Value Date   PLT 263 08/24/2016    Antibiotics  :   Anti-infectives    Start     Dose/Rate Route Frequency Ordered Stop   08/23/16 0600  vancomycin (VANCOCIN) IVPB 1000 mg/200 mL premix     1,000 mg 200 mL/hr over 60 Minutes Intravenous Every 8 hours 08/22/16 1942     08/23/16 0200  piperacillin-tazobactam (ZOSYN) IVPB 3.375 g     3.375 g 12.5 mL/hr over 240 Minutes Intravenous Every 8 hours 08/22/16 1942     08/22/16 2000  vancomycin (VANCOCIN) 1,500 mg in sodium chloride 0.9 % 500 mL IVPB     1,500 mg 250 mL/hr over 120 Minutes Intravenous  Once 08/22/16 1942 08/22/16 2307   08/22/16 2000  piperacillin-tazobactam (ZOSYN) IVPB 3.375 g     3.375 g 100 mL/hr over 30 Minutes Intravenous  Once 08/22/16 1942 08/22/16 2059        Objective:   Vitals:   08/24/16 0900 08/24/16 1000 08/24/16 1008 08/24/16 1100  BP:  (!) 184/87 (!) 176/75   Pulse: 92 (!) 104  92  Resp: 12 12  15   Temp:      TempSrc:      SpO2: 97% 92%  93%  Weight:      Height:        Wt Readings from Last 3 Encounters:  08/22/16 87.4 kg (192 lb 10.9 oz)  08/20/16 89.1 kg (196 lb 6 oz)  05/23/16 89 kg (196 lb 1.6 oz)     Intake/Output Summary (Last 24 hours) at 08/24/16 1233 Last data filed at 08/24/16 1145  Gross per 24 hour  Intake          1043.75 ml  Output             3055 ml    Net         -2011.25 ml     Physical Exam  Awake Alert, Oriented X 3, No new F.N deficits, Normal affect [place].AT,PERRAL Supple Neck,No JVD Symmetrical Chest wall movement, Good air movement bilaterally, CTAB RRR,No Gallops,Rubs or new Murmurs, No Parasternal Heave +ve B.Sounds, Abd Soft, No tenderness,  No rebound - guarding or rigidity. No Cyanosis, Clubbing or edema, No new Rash or bruise      Data Review:    CBC  Recent Labs Lab 08/20/16 1605 08/22/16 0950 08/23/16 0326 08/24/16 0358  WBC 5.3 5.9 4.6 8.2  HGB 13.0 13.8 11.8* 13.1  HCT 40.2 40.8 36.7* 40.4  PLT 196 222 193 263  MCV 82.4 79.8 82.3 82.8  MCH 26.6 27.0 26.5 26.8  MCHC 32.3 33.8 32.2 32.4  RDW 15.5 15.2 15.7* 16.1*  LYMPHSABS 1.2  --   --   --   MONOABS 0.4  --   --   --   EOSABS 0.1  --   --   --   BASOSABS 0.0  --   --   --     Chemistries   Recent Labs Lab 08/20/16 1605 08/22/16 0950 08/23/16 0326 08/24/16 0358  NA 136 134* 137 136  K 4.1 4.3 4.3 4.4  CL 101 98* 103 99*  CO2 26 26 27 28   GLUCOSE 151* 270* 141* 197*  BUN 21* 23* 19 20  CREATININE  0.99 1.12 0.93 0.94  CALCIUM 9.2 9.6 8.8* 9.7  AST 27 29  --   --   ALT 38 39  --   --   ALKPHOS 92 97  --   --   BILITOT 0.7 0.8  --   --    ------------------------------------------------------------------------------------------------------------------ No results for input(s): CHOL, HDL, LDLCALC, TRIG, CHOLHDL, LDLDIRECT in the last 72 hours.  Lab Results  Component Value Date   HGBA1C 7.3 01/25/2015   ------------------------------------------------------------------------------------------------------------------ No results for input(s): TSH, T4TOTAL, T3FREE, THYROIDAB in the last 72 hours.  Invalid input(s): FREET3 ------------------------------------------------------------------------------------------------------------------ No results for input(s): VITAMINB12, FOLATE, FERRITIN, TIBC, IRON, RETICCTPCT in the last 72  hours.  Coagulation profile  Recent Labs Lab 08/22/16 0954  INR 0.94    No results for input(s): DDIMER in the last 72 hours.  Cardiac Enzymes No results for input(s): CKMB, TROPONINI, MYOGLOBIN in the last 168 hours.  Invalid input(s): CK ------------------------------------------------------------------------------------------------------------------    Component Value Date/Time   BNP 34.6 08/20/2016 1605    Inpatient Medications  Scheduled Meds: . atorvastatin  20 mg Oral Daily  . diltiazem  420 mg Oral Daily  . hydrochlorothiazide  12.5 mg Oral Daily  . insulin aspart  0-15 Units Subcutaneous TID WC  . insulin aspart  0-5 Units Subcutaneous QHS  . levothyroxine  175 mcg Oral QHS  . lisinopril  40 mg Oral Daily  . piperacillin-tazobactam (ZOSYN)  IV  3.375 g Intravenous Q8H  . sodium chloride  1 spray Each Nare BID  . vancomycin  1,000 mg Intravenous Q8H   Continuous Infusions:  PRN Meds:.acetaminophen **OR** acetaminophen, hydrALAZINE, HYDROcodone-acetaminophen, ondansetron **OR** ondansetron (ZOFRAN) IV  Micro Results Recent Results (from the past 240 hour(s))  MRSA PCR Screening     Status: None   Collection Time: 08/22/16  6:50 PM  Result Value Ref Range Status   MRSA by PCR NEGATIVE NEGATIVE Final    Comment:        The GeneXpert MRSA Assay (FDA approved for NASAL specimens only), is one component of a comprehensive MRSA colonization surveillance program. It is not intended to diagnose MRSA infection nor to guide or monitor treatment for MRSA infections.     Radiology Reports Dg Chest 2 View  Result Date: 08/20/2016 CLINICAL DATA:  Hemoptysis. EXAM: CHEST  2 VIEW COMPARISON:  Radiographs of January 23, 2016. FINDINGS: The heart size and mediastinal contours are within normal limits. Both lungs are clear. No pneumothorax or pleural effusion is noted. The visualized skeletal structures are unremarkable. IMPRESSION: No active cardiopulmonary disease.  Electronically Signed   By: Marijo Conception, M.D.   On: 08/20/2016 10:29   Ct Chest W Contrast  Result Date: 08/20/2016 CLINICAL DATA:  Hemoptysis and history of thrombocytopenia EXAM: CT CHEST WITH CONTRAST TECHNIQUE: Multidetector CT imaging of the chest was performed during intravenous contrast administration. CONTRAST:  82mL ISOVUE-300 IOPAMIDOL (ISOVUE-300) INJECTION 61% COMPARISON:  Same day chest radiographs FINDINGS: Cardiovascular: No significant vascular findings. Mild atherosclerosis of the aorta. Normal heart size. No pericardial effusion. Mediastinum/Nodes: No axillary, supraclavicular, mediastinal nor hilar lymphadenopathy. Small hiatal hernia. Lungs/Pleura: No evidence of bronchiectasis. A few small right apical pulmonary blebs are noted. Centrilobular emphysema, upper lobe predominant. No pneumonic consolidation, effusion or CHF. Upper Abdomen: No acute abnormality. Musculoskeletal: No chest wall abnormality. No acute or significant osseous findings. IMPRESSION: Upper lobe predominant centrilobular emphysema. No acute cardiopulmonary disease. Aortic atherosclerosis. Electronically Signed   By: Ashley Royalty M.D.   On: 08/20/2016 17:43  Ct Angio Chest Pe W Or Wo Contrast  Result Date: 08/22/2016 CLINICAL DATA:  Hemoptysis.  Thrombocytosis. EXAM: CT ANGIOGRAPHY CHEST WITH CONTRAST TECHNIQUE: Multidetector CT imaging of the chest was performed using the standard protocol during bolus administration of intravenous contrast. Multiplanar CT image reconstructions and MIPs were obtained to evaluate the vascular anatomy. CONTRAST:  100 cc Isovue 370 COMPARISON:  CT chest from 2 days ago, 08/20/2016 FINDINGS: Cardiovascular: No filling defect is identified in the pulmonary arterial tree to suggest pulmonary embolus. Mild atherosclerotic calcification of the aortic arch. Mediastinum/Nodes: No obvious esophageal abnormality. No adenopathy. Lungs/Pleura: Centrilobular emphysema. Mosaic attenuation in the  lower lobes and right middle lobe and to a lesser extent posteriorly in the right upper lobe, subtle sub solid nodularity in a centrilobular distribution. Upper Abdomen: Unremarkable Musculoskeletal: Unremarkable Review of the MIP images confirms the above findings. IMPRESSION: 1. No filling defect is identified in the pulmonary arterial tree to suggest pulmonary embolus. 2. Very faint centrilobular sub solid nodularity causing a mosaic attenuation appearance primarily in the right middle lobe and in the lower lobes. Differential diagnostic considerations favor hypersensitivity pneumonitis, respiratory bronchiolitis, or atypical infectious process. 3. Centrilobular emphysema. Electronically Signed   By: Van Clines M.D.   On: 08/22/2016 18:13   Dg Chest Port 1 View  Result Date: 08/24/2016 CLINICAL DATA:  55 year old male with hemoptysis. EXAM: PORTABLE CHEST 1 VIEW COMPARISON:  Chest CT dated 08/22/2016 FINDINGS: A faint area of increased density noted at the right lung base which may represent normal pulmonary vasculature or correspond to the hazy and ground-glass density noted on the CT. The left lung is clear. There is no pleural effusion or pneumothorax. The cardiac silhouette is within normal limits with no acute osseous pathology. IMPRESSION: Normal pulmonary vasculature versus airspace haziness at the right lung base. No focal consolidation. Electronically Signed   By: Anner Crete M.D.   On: 08/24/2016 05:59   Dg Chest Port 1 View  Result Date: 08/22/2016 CLINICAL DATA:  Hemoptysis. EXAM: PORTABLE CHEST 1 VIEW COMPARISON:  Radiographs and CT scan of August 20, 2016. FINDINGS: The heart size and mediastinal contours are within normal limits. Both lungs are clear. No pneumothorax or pleural effusion is noted. The visualized skeletal structures are unremarkable. IMPRESSION: No acute cardiopulmonary abnormality seen. Electronically Signed   By: Marijo Conception, M.D.   On: 08/22/2016 15:52       Jerelene Salaam M.D on 08/24/2016 at 12:33 PM  Between 7am to 7pm - Pager - [phone]  After 7pm go to [email or web address] - password [facility]  [facility] -  Office  [phone]

## 2016-08-24 NOTE — Progress Notes (Signed)
PCCM PROGRESS NOTE  Admission date: 08/22/2016 Consult date: 08/22/16 Referring provider: Dr. Wynelle Cleveland  CC: hemoptysis  Subjective: Had several more episodes of hemoptysis since yesterday >> last at 9 am today.  Denies chest pain.  Had indigestion yesterday after eating pinto beans >> better now.  Vital signs: BP (!) 152/72   Pulse 86   Temp 98.1 F (36.7 C) (Oral)   Resp 18   Ht 5\' 7"  (1.702 m)   Wt 192 lb 10.9 oz (87.4 kg)   SpO2 94%   BMI 30.18 kg/m   Intake/output: I/O last 3 completed shifts: In: 2446.3 [P.O.:500; I.V.:596.3; IV Piggyback:1350] Out: [id number] [Urine:3760; Emesis/NG output:70]  General: alert Neuro: follows commands HEENT: no oral lesions Cardiac: regular, no murmur Chest: no wheeze/rales Abd: soft, non tender Ext: no edema Skin: no rashes   CMP Latest Ref Rng & Units 08/24/2016 08/23/2016 08/22/2016  Glucose 65 - 99 mg/dL 197(H) 141(H) 270(H)  BUN 6 - 20 mg/dL 20 19 23(H)  Creatinine 0.61 - 1.24 mg/dL 0.94 0.93 1.12  Sodium 135 - 145 mmol/L 136 137 134(L)  Potassium 3.5 - 5.1 mmol/L 4.4 4.3 4.3  Chloride 101 - 111 mmol/L 99(L) 103 98(L)  CO2 22 - 32 mmol/L 28 27 26   Calcium 8.9 - 10.3 mg/dL 9.7 8.8(L) 9.6  Total Protein 6.5 - 8.1 g/dL - - 7.9  Total Bilirubin 0.3 - 1.2 mg/dL - - 0.8  Alkaline Phos 38 - 126 U/L - - 97  AST 15 - 41 U/L - - 29  ALT 17 - 63 U/L - - 39    CBC Latest Ref Rng & Units 08/24/2016 08/23/2016 08/22/2016  WBC 4.0 - 10.5 K/uL 8.2 4.6 5.9  Hemoglobin 13.0 - 17.0 g/dL 13.1 11.8(L) 13.8  Hematocrit 39.0 - 52.0 % 40.4 36.7(L) 40.8  Platelets 150 - 400 K/uL 263 193 222    CBG (last 3)   Recent Labs  08/23/16 1604 08/23/16 2137 08/24/16 0745  GLUCAP 245* 122* 233*    Imaging: Ct Angio Chest Pe W Or Wo Contrast  Result Date: 08/22/2016 CLINICAL DATA:  Hemoptysis.  Thrombocytosis. EXAM: CT ANGIOGRAPHY CHEST WITH CONTRAST TECHNIQUE: Multidetector CT imaging of the chest was performed using the standard protocol  during bolus administration of intravenous contrast. Multiplanar CT image reconstructions and MIPs were obtained to evaluate the vascular anatomy. CONTRAST:  100 cc Isovue 370 COMPARISON:  CT chest from 2 days ago, 08/20/2016 FINDINGS: Cardiovascular: No filling defect is identified in the pulmonary arterial tree to suggest pulmonary embolus. Mild atherosclerotic calcification of the aortic arch. Mediastinum/Nodes: No obvious esophageal abnormality. No adenopathy. Lungs/Pleura: Centrilobular emphysema. Mosaic attenuation in the lower lobes and right middle lobe and to a lesser extent posteriorly in the right upper lobe, subtle sub solid nodularity in a centrilobular distribution. Upper Abdomen: Unremarkable Musculoskeletal: Unremarkable Review of the MIP images confirms the above findings. IMPRESSION: 1. No filling defect is identified in the pulmonary arterial tree to suggest pulmonary embolus. 2. Very faint centrilobular sub solid nodularity causing a mosaic attenuation appearance primarily in the right middle lobe and in the lower lobes. Differential diagnostic considerations favor hypersensitivity pneumonitis, respiratory bronchiolitis, or atypical infectious process. 3. Centrilobular emphysema. Electronically Signed   By: Van Clines M.D.   On: 08/22/2016 18:13   Dg Chest Port 1 View  Result Date: 08/24/2016 CLINICAL DATA:  55 year old male with hemoptysis. EXAM: PORTABLE CHEST 1 VIEW COMPARISON:  Chest CT dated 08/22/2016 FINDINGS: A faint area of increased density noted at  the right lung base which may represent normal pulmonary vasculature or correspond to the hazy and ground-glass density noted on the CT. The left lung is clear. There is no pleural effusion or pneumothorax. The cardiac silhouette is within normal limits with no acute osseous pathology. IMPRESSION: Normal pulmonary vasculature versus airspace haziness at the right lung base. No focal consolidation. Electronically Signed   By:  Anner Crete M.D.   On: 08/24/2016 05:59   Dg Chest Port 1 View  Result Date: 08/22/2016 CLINICAL DATA:  Hemoptysis. EXAM: PORTABLE CHEST 1 VIEW COMPARISON:  Radiographs and CT scan of August 20, 2016. FINDINGS: The heart size and mediastinal contours are within normal limits. Both lungs are clear. No pneumothorax or pleural effusion is noted. The visualized skeletal structures are unremarkable. IMPRESSION: No acute cardiopulmonary abnormality seen. Electronically Signed   By: Marijo Conception, M.D.   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  He had prior episode of hemoptysis in 2009 from E coli PNA.  Assessment/plan:  Hemoptysis with CT angio chest concerning for pneumonia. - Day 3 of vancomycin, zosyn - keep NPO after 12 am on 10/24 with anticipation he will need bronchoscopy  - avoid Goody powder  Essential thrombocytopenia. - f/u CBC - hold ASA, agrylin for now  Hx of OSA. - reassess as outpt  Updated pt's family at bedside.  Chesley Mires, MD [facility] Pulmonary/Critical Care 08/24/2016, 12:58 PM Pager:  [phone] After 3pm call: [phone]

## 2016-08-25 LAB — GLUCOSE, CAPILLARY
GLUCOSE-CAPILLARY: 169 mg/dL — AB (ref 65–99)
GLUCOSE-CAPILLARY: 252 mg/dL — AB (ref 65–99)
Glucose-Capillary: 191 mg/dL — ABNORMAL HIGH (ref 65–99)
Glucose-Capillary: 195 mg/dL — ABNORMAL HIGH (ref 65–99)

## 2016-08-25 LAB — CBC
HCT: 38.3 % — ABNORMAL LOW (ref 39.0–52.0)
Hemoglobin: 12.2 g/dL — ABNORMAL LOW (ref 13.0–17.0)
MCH: 26.7 pg (ref 26.0–34.0)
MCHC: 31.9 g/dL (ref 30.0–36.0)
MCV: 83.8 fL (ref 78.0–100.0)
PLATELETS: 266 10*3/uL (ref 150–400)
RBC: 4.57 MIL/uL (ref 4.22–5.81)
RDW: 16.4 % — AB (ref 11.5–15.5)
WBC: 6.3 10*3/uL (ref 4.0–10.5)

## 2016-08-25 LAB — VANCOMYCIN, TROUGH: VANCOMYCIN TR: 20 ug/mL (ref 15–20)

## 2016-08-25 LAB — BASIC METABOLIC PANEL
ANION GAP: 7 (ref 5–15)
BUN: 22 mg/dL — ABNORMAL HIGH (ref 6–20)
CO2: 30 mmol/L (ref 22–32)
Calcium: 9.3 mg/dL (ref 8.9–10.3)
Chloride: 101 mmol/L (ref 101–111)
Creatinine, Ser: 1.1 mg/dL (ref 0.61–1.24)
GFR calc Af Amer: >60 mL/min (ref >60)
GLUCOSE: 154 mg/dL — AB (ref 65–99)
POTASSIUM: 4.2 mmol/L (ref 3.5–5.1)
SODIUM: 138 mmol/L (ref 135–145)

## 2016-08-25 MED ORDER — LIDOCAINE VISCOUS 2 % MT SOLN: 15.0000 mL | Freq: Once | OROMUCOSAL | Status: DC

## 2016-08-25 MED ORDER — LORAZEPAM 1 MG PO TABS
1.0000 mg | ORAL_TABLET | Freq: Three times a day (TID) | ORAL | Status: DC | PRN
  Administered 2016-08-25: 1 mg via ORAL
  Administered 2016-08-26 – 2016-08-27 (×4): 0.5 mg via ORAL
  Administered 2016-08-27: 1 mg via ORAL
  Administered 2016-08-27: 0.5 mg via ORAL
  Filled 2016-08-25 (×5): qty 1

## 2016-08-25 MED ORDER — LIDOCAINE VISCOUS 2 % MT SOLN
15.0000 mL | Freq: Once | OROMUCOSAL | Status: AC
  Administered 2016-08-25: 15 mL via OROMUCOSAL
  Filled 2016-08-25: qty 15

## 2016-08-25 MED ORDER — OXYMETAZOLINE HCL 0.05 % NA SOLN
2.0000 | Freq: Once | NASAL | Status: AC
Start: 2016-08-25 — End: 2016-08-25
  Administered 2016-08-25: 2 via NASAL
  Filled 2016-08-25: qty 15

## 2016-08-25 NOTE — Progress Notes (Signed)
PROGRESS NOTE                                                                                                                                                                                                             Patient Demographics:    Charles Bowman, is a 55 y.o. male, DOB - October 25, 1961, [id number]  Admit date - 08/22/2016   Admitting Physician [name], MD  Outpatient Primary MD for the patient is [name], DO  LOS - 2  Outpatient Specialists:None  Chief Complaint  Patient presents with  . 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   Subjective:   Continues to have hemoptysis. Complains of some chest tightness with cough.   Assessment  & Plan :    Principal Problem:   Hemoptysis CT angiogram of the chest concerning for pneumonia. PC CM following.recommend ENT evaluation of upper airway, which is likely the source for his hemoptysis. ASA and anagrelide discontinued. H&H stable. Transfer to telemetry.   Active Problems: ? lobar pneumonia Afebrile. Stable on room air with occasional dropping O2 sat to high 80s. DC vancomycin. Discontinue Zosyn tomorrow.    Essential thrombocythemia ([place]) Hold ASA and acrylin. Platelets stable. Follows with Dr [name].    Hypothyroidism Continue Synthroid.  OSA Continue CPAP     Essential hypertension Poorly controlled. Home medications adjusted. 2-D echo shows EF of 45-50% with diffuse hypokinesis. Dr [name] Labs discussed with cardiology who recommended this likely from poorly controlled hypertension and patient should follow-up with his primary cardiologist Dr. [name] as outpatient.  Hyponatremia Mild, secondary to dehydration. Resolved with fluids     Diabetes mellitus type 2, uncomplicated  (HCC) Continue sliding scale coverage. Holding metformin and Amaryl       Code Status : Full code  Family Communication  : None at bedside  Disposition Plan  : Transfer to telemetry.  Barriers For Discharge : Active hemoptysis  Consults  :   PC CM ENT  Procedures  : CT angiogram of the chest 2-D echo  DVT Prophylaxis  :  SCDs  Lab Results  Component Value Date   PLT 266 08/25/2016    Antibiotics  :    Anti-infectives    Start  Dose/Rate Route Frequency Ordered Stop   08/23/16 0600  vancomycin (VANCOCIN) IVPB 1000 mg/200 mL premix  Status:  Discontinued     1,000 mg 200 mL/hr over 60 Minutes Intravenous Every 8 hours 08/22/16 1942 08/25/16 0835   08/23/16 0200  piperacillin-tazobactam (ZOSYN) IVPB 3.375 g     3.375 g 12.5 mL/hr over 240 Minutes Intravenous Every 8 hours 08/22/16 1942     08/22/16 2000  vancomycin (VANCOCIN) 1,500 mg in sodium chloride 0.9 % 500 mL IVPB     1,500 mg 250 mL/hr over 120 Minutes Intravenous  Once 08/22/16 1942 08/22/16 2307   08/22/16 2000  piperacillin-tazobactam (ZOSYN) IVPB 3.375 g     3.375 g 100 mL/hr over 30 Minutes Intravenous  Once 08/22/16 1942 08/22/16 2059        Objective:   Vitals:   08/25/16 0321 08/25/16 0400 08/25/16 0600 08/25/16 0800  BP:  (!) 144/65 137/78 (!) 154/80  Pulse:  66 75 83  Resp:  14 17 14   Temp: 97.5 F (36.4 C)   98.1 F (36.7 C)  TempSrc: Oral   Oral  SpO2:  (!) 87% (!) 82% 90%  Weight:      Height:        Wt Readings from Last 3 Encounters:  08/22/16 87.4 kg (192 lb 10.9 oz)  08/20/16 89.1 kg (196 lb 6 oz)  05/23/16 89 kg (196 lb 1.6 oz)     Intake/Output Summary (Last 24 hours) at 08/25/16 1230 Last data filed at 08/25/16 0800  Gross per 24 hour  Intake              740 ml  Output             2310 ml  Net            -1570 ml     Physical Exam  Gen: not in distress HEENT: no pallor, moist mucosa, supple neck Chest: clear b/l, no added sounds CVS: N S1&S2, no  murmurs, rubs or gallop GI: soft, NT, ND, BS+ Musculoskeletal: warm, no edema     Data Review:    CBC  Recent Labs Lab 08/20/16 1605 08/22/16 0950 08/23/16 0326 08/24/16 0358 08/25/16 0311  WBC 5.3 5.9 4.6 8.2 6.3  HGB 13.0 13.8 11.8* 13.1 12.2*  HCT 40.2 40.8 36.7* 40.4 38.3*  PLT 196 222 193 263 266  MCV 82.4 79.8 82.3 82.8 83.8  MCH 26.6 27.0 26.5 26.8 26.7  MCHC 32.3 33.8 32.2 32.4 31.9  RDW 15.5 15.2 15.7* 16.1* 16.4*  LYMPHSABS 1.2  --   --   --   --   MONOABS 0.4  --   --   --   --   EOSABS 0.1  --   --   --   --   BASOSABS 0.0  --   --   --   --     Chemistries   Recent Labs Lab 08/20/16 1605 08/22/16 0950 08/23/16 0326 08/24/16 0358 08/25/16 0311  NA 136 134* 137 136 138  K 4.1 4.3 4.3 4.4 4.2  CL 101 98* 103 99* 101  CO2 26 26 27 28 30   GLUCOSE 151* 270* 141* 197* 154*  BUN 21* 23* 19 20 22*  CREATININE 0.99 1.12 0.93 0.94 1.10  CALCIUM 9.2 9.6 8.8* 9.7 9.3  AST 27 29  --   --   --   ALT 38 39  --   --   --   [facility]  92 97  --   --   --   BILITOT 0.7 0.8  --   --   --    ------------------------------------------------------------------------------------------------------------------ No results for input(s): CHOL, HDL, LDLCALC, TRIG, CHOLHDL, LDLDIRECT in the last 72 hours.  Lab Results  Component Value Date   HGBA1C 7.3 01/25/2015   ------------------------------------------------------------------------------------------------------------------ No results for input(s): TSH, T4TOTAL, T3FREE, THYROIDAB in the last 72 hours.  Invalid input(s): FREET3 ------------------------------------------------------------------------------------------------------------------ No results for input(s): VITAMINB12, FOLATE, FERRITIN, TIBC, IRON, RETICCTPCT in the last 72 hours.  Coagulation profile  Recent Labs Lab 08/22/16 0954  INR 0.94    No results for input(s): DDIMER in the last 72 hours.  Cardiac Enzymes No results for input(s): CKMB,  TROPONINI, MYOGLOBIN in the last 168 hours.  Invalid input(s): CK ------------------------------------------------------------------------------------------------------------------    Component Value Date/Time   BNP 34.6 08/20/2016 1605    Inpatient Medications  Scheduled Meds: . atorvastatin  20 mg Oral Daily  . diltiazem  420 mg Oral Daily  . hydrochlorothiazide  12.5 mg Oral Daily  . insulin aspart  0-15 Units Subcutaneous TID WC  . insulin aspart  0-5 Units Subcutaneous QHS  . levothyroxine  175 mcg Oral QHS  . lisinopril  40 mg Oral Daily  . piperacillin-tazobactam (ZOSYN)  IV  3.375 g Intravenous Q8H  . sodium chloride  1 spray Each Nare BID   Continuous Infusions:  PRN Meds:.acetaminophen **OR** acetaminophen, hydrALAZINE, HYDROcodone-acetaminophen, ondansetron **OR** ondansetron (ZOFRAN) IV, zolpidem  Micro Results Recent Results (from the past 240 hour(s))  MRSA PCR Screening     Status: None   Collection Time: 08/22/16  6:50 PM  Result Value Ref Range Status   MRSA by PCR NEGATIVE NEGATIVE Final    Comment:        The GeneXpert MRSA Assay (FDA approved for NASAL specimens only), is one component of a comprehensive MRSA colonization surveillance program. It is not intended to diagnose MRSA infection nor to guide or monitor treatment for MRSA infections.     Radiology Reports Dg Chest 2 View  Result Date: 08/20/2016 CLINICAL DATA:  Hemoptysis. EXAM: CHEST  2 VIEW COMPARISON:  Radiographs of January 23, 2016. FINDINGS: The heart size and mediastinal contours are within normal limits. Both lungs are clear. No pneumothorax or pleural effusion is noted. The visualized skeletal structures are unremarkable. IMPRESSION: No active cardiopulmonary disease. Electronically Signed   By: Marijo Conception, M.D.   On: 08/20/2016 10:29   Ct Chest W Contrast  Result Date: 08/20/2016 CLINICAL DATA:  Hemoptysis and history of thrombocytopenia EXAM: CT CHEST WITH CONTRAST  TECHNIQUE: Multidetector CT imaging of the chest was performed during intravenous contrast administration. CONTRAST:  23mL ISOVUE-300 IOPAMIDOL (ISOVUE-300) INJECTION 61% COMPARISON:  Same day chest radiographs FINDINGS: Cardiovascular: No significant vascular findings. Mild atherosclerosis of the aorta. Normal heart size. No pericardial effusion. Mediastinum/Nodes: No axillary, supraclavicular, mediastinal nor hilar lymphadenopathy. Small hiatal hernia. Lungs/Pleura: No evidence of bronchiectasis. A few small right apical pulmonary blebs are noted. Centrilobular emphysema, upper lobe predominant. No pneumonic consolidation, effusion or CHF. Upper Abdomen: No acute abnormality. Musculoskeletal: No chest wall abnormality. No acute or significant osseous findings. IMPRESSION: Upper lobe predominant centrilobular emphysema. No acute cardiopulmonary disease. Aortic atherosclerosis. Electronically Signed   By: Ashley Royalty M.D.   On: 08/20/2016 17:43   Ct Angio Chest Pe W Or Wo Contrast  Result Date: 08/22/2016 CLINICAL DATA:  Hemoptysis.  Thrombocytosis. EXAM: CT ANGIOGRAPHY CHEST WITH CONTRAST TECHNIQUE: Multidetector CT imaging of the chest was  performed using the standard protocol during bolus administration of intravenous contrast. Multiplanar CT image reconstructions and MIPs were obtained to evaluate the vascular anatomy. CONTRAST:  100 cc Isovue 370 COMPARISON:  CT chest from 2 days ago, 08/20/2016 FINDINGS: Cardiovascular: No filling defect is identified in the pulmonary arterial tree to suggest pulmonary embolus. Mild atherosclerotic calcification of the aortic arch. Mediastinum/Nodes: No obvious esophageal abnormality. No adenopathy. Lungs/Pleura: Centrilobular emphysema. Mosaic attenuation in the lower lobes and right middle lobe and to a lesser extent posteriorly in the right upper lobe, subtle sub solid nodularity in a centrilobular distribution. Upper Abdomen: Unremarkable Musculoskeletal: Unremarkable  Review of the MIP images confirms the above findings. IMPRESSION: 1. No filling defect is identified in the pulmonary arterial tree to suggest pulmonary embolus. 2. Very faint centrilobular sub solid nodularity causing a mosaic attenuation appearance primarily in the right middle lobe and in the lower lobes. Differential diagnostic considerations favor hypersensitivity pneumonitis, respiratory bronchiolitis, or atypical infectious process. 3. Centrilobular emphysema. Electronically Signed   By: Van Clines M.D.   On: 08/22/2016 18:13   Dg Chest Port 1 View  Result Date: 08/24/2016 CLINICAL DATA:  55 year old male with hemoptysis. EXAM: PORTABLE CHEST 1 VIEW COMPARISON:  Chest CT dated 08/22/2016 FINDINGS: A faint area of increased density noted at the right lung base which may represent normal pulmonary vasculature or correspond to the hazy and ground-glass density noted on the CT. The left lung is clear. There is no pleural effusion or pneumothorax. The cardiac silhouette is within normal limits with no acute osseous pathology. IMPRESSION: Normal pulmonary vasculature versus airspace haziness at the right lung base. No focal consolidation. Electronically Signed   By: Anner Crete M.D.   On: 08/24/2016 05:59   Dg Chest Port 1 View  Result Date: 08/22/2016 CLINICAL DATA:  Hemoptysis. EXAM: PORTABLE CHEST 1 VIEW COMPARISON:  Radiographs and CT scan of August 20, 2016. FINDINGS: The heart size and mediastinal contours are within normal limits. Both lungs are clear. No pneumothorax or pleural effusion is noted. The visualized skeletal structures are unremarkable. IMPRESSION: No acute cardiopulmonary abnormality seen. Electronically Signed   By: Marijo Conception, M.D.   On: 08/22/2016 15:52    Time Spent in minutes  25   Louellen Molder M.D on 08/25/2016 at 12:30 PM  Between 7am to 7pm - Pager - [phone]  After 7pm go to [email or web address] - password [facility]  [facility] -  Office   [phone]

## 2016-08-25 NOTE — Progress Notes (Signed)
PCCM PROGRESS NOTE  Admission date: 08/22/2016 Consult date: 08/22/16 Referring provider: Dr. Wynelle Cleveland  CC: hemoptysis  Subjective: Coughed up blood again overnight Says it pools up in his throat then he needs to cough it out Denies dyspnea  Vital signs: BP (!) 154/80 (BP Location: Left Arm)   Pulse 83   Temp 98.1 F (36.7 C) (Oral)   Resp 14   Ht 5\' 7"  (1.702 m)   Wt 87.4 kg (192 lb 10.9 oz)   SpO2 90%   BMI 30.18 kg/m   Intake/output: I/O last 3 completed shifts: In: 1340 [P.O.:340; IV Piggyback:1000] Out: [id number] [Urine:4250; Emesis/NG output:35; Other:55]  General: awake, alert Neuro: follows commands, moves all four ext HEENT: NCAT, OP clear Cardiac: RRR, no mgr Chest: CTA B, normal effort Abd: Soft, non tender, BS+ Ext: warm, no cyanosis or edema Skin: no rashes, well perfused   CMP Latest Ref Rng & Units 08/25/2016 08/24/2016 08/23/2016  Glucose 65 - 99 mg/dL 154(H) 197(H) 141(H)  BUN 6 - 20 mg/dL 22(H) 20 19  Creatinine 0.61 - 1.24 mg/dL 1.10 0.94 0.93  Sodium 135 - 145 mmol/L 138 136 137  Potassium 3.5 - 5.1 mmol/L 4.2 4.4 4.3  Chloride 101 - 111 mmol/L 101 99(L) 103  CO2 22 - 32 mmol/L 30 28 27   Calcium 8.9 - 10.3 mg/dL 9.3 9.7 8.8(L)  Total Protein 6.5 - 8.1 g/dL - - -  Total Bilirubin 0.3 - 1.2 mg/dL - - -  Alkaline Phos 38 - 126 U/L - - -  AST 15 - 41 U/L - - -  ALT 17 - 63 U/L - - -    CBC Latest Ref Rng & Units 08/25/2016 08/24/2016 08/23/2016  WBC 4.0 - 10.5 K/uL 6.3 8.2 4.6  Hemoglobin 13.0 - 17.0 g/dL 12.2(L) 13.1 11.8(L)  Hematocrit 39.0 - 52.0 % 38.3(L) 40.4 36.7(L)  Platelets 150 - 400 K/uL 266 263 193    CBG (last 3)   Recent Labs  08/24/16 1613 08/24/16 2152 08/25/16 0745  GLUCAP 264* 210* 191*    Imaging: Dg Chest Port 1 View  Result Date: 08/24/2016 CLINICAL DATA:  55 year old male with hemoptysis. EXAM: PORTABLE CHEST 1 VIEW COMPARISON:  Chest CT dated 08/22/2016 FINDINGS: A faint area of increased density noted at the  right lung base which may represent normal pulmonary vasculature or correspond to the hazy and ground-glass density noted on the CT. The left lung is clear. There is no pleural effusion or pneumothorax. The cardiac silhouette is within normal limits with no acute osseous pathology. IMPRESSION: Normal pulmonary vasculature versus airspace haziness at the right lung base. No focal consolidation. Electronically Signed   By: Anner Crete M.D.   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  He had prior episode of hemoptysis in 2009 from E coli PNA.  Assessment/plan:  Hemoptysis, uncertain source but I suspect upper airway based on his history and prior normal bronchoscopy in 2009.  Pneumonia? doubt - Day 4 of vancomycin, zosyn > will d/c vanc, consider stopping zosyn 10/24 - ENT consult today to look at nasopharynx - plan bronchoscopy tomorrow - avoid Goody powder  Essential thrombocytopenia. - f/u CBC - hold ASA, agrylin for now  Hx of OSA. - reassess as outpt  Updated pt's family at bedside.  Roselie Awkward, MD [place] PCCM Pager: [phone] Cell: [phone] After 3pm or if no response, call [phone]

## 2016-08-25 NOTE — Progress Notes (Signed)
RT called to pts bedside for low sats. RT placed pt on 6L HFNC - sats stable. Pt coughing up thick red small - pt states when he coughs his oxygen gets low

## 2016-08-25 NOTE — Consult Note (Signed)
Charles Bowman, Charles Bowman 55 y.o., male [id number]     Chief Complaint: bleeding from pharynx  HPI: 55 yo wm with thrombocytosis on ASA, Agrylin and also prn Goody powders has been hawking up bright red blood from his pharynx several times daily for the last 6 days.  No throat pain.  No trauma.  No SOB. He does not feel like he is truly coughing it up from his lungs.  Had a prior episode with true hemoptysis with E Coli pneumonia some years back.    PMH: Past Medical History:  Diagnosis Date  . Anemia   . Anxiety 05/11/2014  . Cancer ([place])   . Diabetes mellitus without complication ([place])   . Hypertension   . Murmur 11/09/2014  . Thyroid disease     Surg Hx: Past Surgical History:  Procedure Laterality Date  . BONE MARROW BIOPSY    . BRONCHOSCOPY      FHx:   Family History  Problem Relation Age of Onset  . Cancer Father     prostate ca  . Dementia Father 61    2008  . COPD Mother     +Tobacco Use   SocHx:  reports that he quit smoking about 7 years ago. His smoking use included Cigarettes. He has a 30.00 pack-year smoking history. He has never used smokeless tobacco. He reports that he does not drink alcohol or use drugs.  ALLERGIES: No Known Allergies  Medications Prior to Admission  Medication Sig Dispense Refill  . anagrelide (AGRYLIN) 1 MG capsule TAKE 3 CAPSULES BY MOUTH DAILY. (Patient taking differently: TAKE 2 IN THE  MORNING AND 1 IN THE EVENING) 90 capsule 6  . aspirin 81 MG tablet Take 81 mg by mouth daily.    Marland Kitchen atorvastatin (LIPITOR) 20 MG tablet Take 1 tablet (20 mg total) by mouth daily. NO MORE REFILLS WITHOUT OFFICE VISIT/ LABS - 2ND NOTICE 15 tablet 0  . fluticasone (FLONASE) 50 MCG/ACT nasal spray PLACE 2 SPRAYS IN EACH NOSTRIL EVERY DAY 16 g 1  . glimepiride (AMARYL) 4 MG tablet TAKE 1 TABLET (4 MG TOTAL) BY MOUTH DAILY BEFORE BREAKFAST.  "NO MORE REFILLS WITHOUT OV" (Patient taking differently: 4 mg. TAKE 1 TABLET (4 MG TOTAL) BY MOUTH DAILY BEFORE BREAKFAST.  "NO  MORE REFILLS WITHOUT OV") 30 tablet 0  . hydrochlorothiazide (MICROZIDE) 12.5 MG capsule TAKE ONE CAPSULE BY MOUTH EVERY DAY 30 capsule 0  . lisinopril (PRINIVIL,ZESTRIL) 20 MG tablet Take 20 mg by mouth daily.     Marland Kitchen LORazepam (ATIVAN) 1 MG tablet TAKE 1 TABLET BY MOUTH 3 TIMES A DAY AS NEEDED FOR ANXIETY 30 tablet 2  . MATZIM LA 420 MG 24 hr tablet TAKE 1 TABLET (420 MG TOTAL) BY MOUTH DAILY. 15 tablet 0  . metFORMIN (GLUCOPHAGE-XR) 500 MG 24 hr tablet TAKE 4 TABLETS BY MOUTH EVERY DAY  "OFFICE VISIT NEEDED" (Patient taking differently: Take 1,000 mg by mouth 2 (two) times daily. ) 60 tablet 0  . Multiple Vitamins-Minerals (MULTIVITAMIN WITH MINERALS) tablet Take 1 tablet by mouth daily.    Marland Kitchen SYNTHROID 175 MCG tablet TAKE 1 TABLET (175 MCG TOTAL) BY MOUTH DAILY  "NO MORE REFILLS WITHOUT OFFICE VISIT" 15 tablet 0  . glucose blood (ONE TOUCH ULTRA TEST) test strip Use as instructed 100 each 0  . Lancets (ONETOUCH ULTRASOFT) lancets Use as instructed 100 each 0    Results for orders placed or performed during the hospital encounter of 08/22/16 (from the past 48 hour(s))  Glucose, capillary     Status: Abnormal   Collection Time: 08/23/16  4:04 PM  Result Value Ref Range   Glucose-Capillary 245 (H) 65 - 99 mg/dL   Comment 1 Notify RN    Comment 2 Document in Chart   Glucose, capillary     Status: Abnormal   Collection Time: 08/23/16  9:37 PM  Result Value Ref Range   Glucose-Capillary 122 (H) 65 - 99 mg/dL  CBC     Status: Abnormal   Collection Time: 08/24/16  3:58 AM  Result Value Ref Range   WBC 8.2 4.0 - 10.5 K/uL   RBC 4.88 4.22 - 5.81 MIL/uL   Hemoglobin 13.1 13.0 - 17.0 g/dL   HCT 40.4 39.0 - 52.0 %   MCV 82.8 78.0 - 100.0 fL   MCH 26.8 26.0 - 34.0 pg   MCHC 32.4 30.0 - 36.0 g/dL   RDW 16.1 (H) 11.5 - 15.5 %   Platelets 263 150 - 400 K/uL  Basic metabolic panel     Status: Abnormal   Collection Time: 08/24/16  3:58 AM  Result Value Ref Range   Sodium 136 135 - 145 mmol/L    Potassium 4.4 3.5 - 5.1 mmol/L   Chloride 99 (L) 101 - 111 mmol/L   CO2 28 22 - 32 mmol/L   Glucose, Bld 197 (H) 65 - 99 mg/dL   BUN 20 6 - 20 mg/dL   Creatinine, Ser 0.94 0.61 - 1.24 mg/dL   Calcium 9.7 8.9 - 10.3 mg/dL   GFR calc non Af Amer >60 >60 mL/min   GFR calc Af Amer >60 >60 mL/min    Comment: (NOTE) The eGFR has been calculated using the CKD EPI equation. This calculation has not been validated in all clinical situations. eGFR's persistently <60 mL/min signify possible Chronic Kidney Disease.    Anion gap 9 5 - 15  Glucose, capillary     Status: Abnormal   Collection Time: 08/24/16  7:45 AM  Result Value Ref Range   Glucose-Capillary 233 (H) 65 - 99 mg/dL   Comment 1 Notify RN    Comment 2 Document in Chart   Glucose, capillary     Status: Abnormal   Collection Time: 08/24/16 12:58 PM  Result Value Ref Range   Glucose-Capillary 165 (H) 65 - 99 mg/dL  Glucose, capillary     Status: Abnormal   Collection Time: 08/24/16  4:13 PM  Result Value Ref Range   Glucose-Capillary 264 (H) 65 - 99 mg/dL  Glucose, capillary     Status: Abnormal   Collection Time: 08/24/16  9:52 PM  Result Value Ref Range   Glucose-Capillary 210 (H) 65 - 99 mg/dL  CBC     Status: Abnormal   Collection Time: 08/25/16  3:11 AM  Result Value Ref Range   WBC 6.3 4.0 - 10.5 K/uL   RBC 4.57 4.22 - 5.81 MIL/uL   Hemoglobin 12.2 (L) 13.0 - 17.0 g/dL   HCT 38.3 (L) 39.0 - 52.0 %   MCV 83.8 78.0 - 100.0 fL   MCH 26.7 26.0 - 34.0 pg   MCHC 31.9 30.0 - 36.0 g/dL   RDW 16.4 (H) 11.5 - 15.5 %   Platelets 266 150 - 400 K/uL  Basic metabolic panel     Status: Abnormal   Collection Time: 08/25/16  3:11 AM  Result Value Ref Range   Sodium 138 135 - 145 mmol/L   Potassium 4.2 3.5 - 5.1 mmol/L   Chloride  101 101 - 111 mmol/L   CO2 30 22 - 32 mmol/L   Glucose, Bld 154 (H) 65 - 99 mg/dL   BUN 22 (H) 6 - 20 mg/dL   Creatinine, Ser 1.10 0.61 - 1.24 mg/dL   Calcium 9.3 8.9 - 10.3 mg/dL   GFR calc non Af  Amer >60 >60 mL/min   GFR calc Af Amer >60 >60 mL/min    Comment: (NOTE) The eGFR has been calculated using the CKD EPI equation. This calculation has not been validated in all clinical situations. eGFR's persistently <60 mL/min signify possible Chronic Kidney Disease.    Anion gap 7 5 - 15  Vancomycin, trough     Status: None   Collection Time: 08/25/16  6:28 AM  Result Value Ref Range   Vancomycin Tr 20 15 - 20 ug/mL  Glucose, capillary     Status: Abnormal   Collection Time: 08/25/16  7:45 AM  Result Value Ref Range   Glucose-Capillary 191 (H) 65 - 99 mg/dL   Comment 1 Notify RN    Comment 2 Document in Chart   Glucose, capillary     Status: Abnormal   Collection Time: 08/25/16 12:32 PM  Result Value Ref Range   Glucose-Capillary 169 (H) 65 - 99 mg/dL   Dg Chest Port 1 View  Result Date: 08/24/2016 CLINICAL DATA:  55 year old male with hemoptysis. EXAM: PORTABLE CHEST 1 VIEW COMPARISON:  Chest CT dated 08/22/2016 FINDINGS: A faint area of increased density noted at the right lung base which may represent normal pulmonary vasculature or correspond to the hazy and ground-glass density noted on the CT. The left lung is clear. There is no pleural effusion or pneumothorax. The cardiac silhouette is within normal limits with no acute osseous pathology. IMPRESSION: Normal pulmonary vasculature versus airspace haziness at the right lung base. No focal consolidation. Electronically Signed   By: Anner Crete M.D.   On: 08/24/2016 05:59      Blood pressure (!) 154/80, pulse 83, temperature 98.1 F (36.7 C), temperature source Oral, resp. rate 14, height '5\' 7"'  (1.702 m), weight 87.4 kg (192 lb 10.9 oz), SpO2 90 %.  PHYSICAL EXAM: Overall appearance:  Stocky.  Mental status is intact.   Head: NCAT Ears: clear Nose:  Anterior nose with corrugated septum.  Mild drying of septal mucosa (pt does use CPAP) but no obvious bleeding sites.  No polyps or active drainage. Oral Cavity:  Teeth  in good repair.   Oral Pharynx/Hypopharynx/Larynx:  No blood in pharynx. Neuro: grossly intact Neck: intact  Informed consent was obtained.  Using 1% viscous xylocaine topical anesthesia, the flexible laryngoscope was introduced into both sides of the nose.   Careful inspection reveals no bleeding sites in nasal cavities, nasopharynx, oropharynx, hypopharynx, or larynx.    Studies Reviewed:none    Assessment/Plan History c/w bleeding in pharynx with no site identified.    Bronchoscopy to follow per CCM.  Jodi Marble 08/23/1172, 1:03 PM

## 2016-08-25 NOTE — Progress Notes (Signed)
Pharmacy Antibiotic Note  Charles Bowman is a 55 y.o. 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 CTa chest reveals likely infiltrate.  Pharmacy has been consulted for vancomycin and Zosyn dosing.  Today, 08/25/2016  Day #3 antibiotics  SCr slightly increased to 1.1 with CrCl ~ 83 ml/min  WBC WNL  Afebrile  Vancomycin trough level 20, upper end of therapeutic range.    Plan:  Vancomycin 1000 mg IV q8 hr; goal trough 15-20 mcg/mL  Zosyn 3.375 g IV q8 hrs by 4-hr infusion  Follow clinical course, renal function, culture results as available  Follow for de-escalation of antibiotics and LOT  MRSA PCR negative, consider stopping vancomycin as appropriate   Height: 5\' 7"  (170.2 cm) Weight: 192 lb 10.9 oz (87.4 kg) IBW/kg (Calculated) : 66.1  Temp (24hrs), Avg:97.9 F (36.6 C), Min:97.5 F (36.4 C), Max:98.1 F (36.7 C)   Recent Labs Lab 08/20/16 1605 08/22/16 0950 08/22/16 1017 08/22/16 1401 08/23/16 0326 08/24/16 0358 08/25/16 0311 08/25/16 0628  WBC 5.3 5.9  --   --  4.6 8.2 6.3  --   CREATININE 0.99 1.12  --   --  0.93 0.94 1.10  --   LATICACIDVEN  --   --  2.78* 1.7  --   --   --   --   VANCOTROUGH  --   --   --   --   --   --   --  20    Estimated Creatinine Clearance: 82.9 mL/min (by C-G formula based on SCr of 1.1 mg/dL).    No Known Allergies  Antimicrobials this admission: Vancomycin 10/20 >>  Zosyn 10/20 >>    Dose adjustments this admission: 10/23 0530 VT =  20 mcg/ml on vanco 1gm q8h (prior to 7th dose)   Microbiology results: 10/20 MRSA PCR: neg  Thank you for allowing pharmacy to be a part of this patient's care.  Gretta Arab PharmD, BCPS Pager [phone] 08/25/2016 7:41 AM

## 2016-08-26 ENCOUNTER — Ambulatory Visit: Payer: PRIVATE HEALTH INSURANCE | Admitting: Hematology and Oncology

## 2016-08-26 ENCOUNTER — Encounter (HOSPITAL_COMMUNITY)
Admission: EM | Disposition: A | Discharge status: Home / Self Care | Payer: Self-pay | Source: Home / Self Care | Attending: Internal Medicine

## 2016-08-26 ENCOUNTER — Inpatient Hospital Stay (HOSPITAL_COMMUNITY): Payer: 59

## 2016-08-26 DIAGNOSIS — J9601 Acute respiratory failure with hypoxia: Secondary | ICD-10-CM

## 2016-08-26 HISTORY — PX: VIDEO BRONCHOSCOPY: SHX5072

## 2016-08-26 LAB — CBC
HCT: 35.1 % — ABNORMAL LOW (ref 39.0–52.0)
HEMOGLOBIN: 11.3 g/dL — AB (ref 13.0–17.0)
MCH: 27 pg (ref 26.0–34.0)
MCHC: 32.2 g/dL (ref 30.0–36.0)
MCV: 83.8 fL (ref 78.0–100.0)
PLATELETS: 272 10*3/uL (ref 150–400)
RBC: 4.19 MIL/uL — AB (ref 4.22–5.81)
RDW: 16.3 % — ABNORMAL HIGH (ref 11.5–15.5)
WBC: 5.4 10*3/uL (ref 4.0–10.5)

## 2016-08-26 LAB — GLUCOSE, CAPILLARY
GLUCOSE-CAPILLARY: 189 mg/dL — AB (ref 65–99)
GLUCOSE-CAPILLARY: 203 mg/dL — AB (ref 65–99)
Glucose-Capillary: 252 mg/dL — ABNORMAL HIGH (ref 65–99)
Glucose-Capillary: 147 mg/dL — ABNORMAL HIGH (ref 65–99)

## 2016-08-26 LAB — TYPE AND SCREEN
ABO/RH(D): O POS
Antibody Screen: NEGATIVE
UNIT DIVISION: 0
UNIT DIVISION: 0

## 2016-08-26 SURGERY — VIDEO BRONCHOSCOPY WITHOUT FLUORO
Anesthesia: Moderate Sedation | Laterality: Bilateral

## 2016-08-26 MED ORDER — LIDOCAINE HCL 2 % EX GEL: 1.0000 "application " | Freq: Once | CUTANEOUS | Status: DC

## 2016-08-26 MED ORDER — LIDOCAINE HCL 1 % IJ SOLN
INTRAMUSCULAR | Status: DC | PRN
  Administered 2016-08-26: 6 mL

## 2016-08-26 MED ORDER — MIDAZOLAM HCL 2 MG/2ML IJ SOLN
INTRAMUSCULAR | Status: AC
  Administered 2016-08-26: 2 mg
  Administered 2016-08-26: 08:00:00
  Filled 2016-08-26: qty 4

## 2016-08-26 MED ORDER — FENTANYL CITRATE (PF) 100 MCG/2ML IJ SOLN
INTRAMUSCULAR | Status: AC
  Administered 2016-08-26: 50 ug
  Filled 2016-08-26: qty 4

## 2016-08-26 MED ORDER — MIDAZOLAM HCL 2 MG/2ML IJ SOLN
INTRAMUSCULAR | Status: AC
  Administered 2016-08-26: 2 mg
  Filled 2016-08-26: qty 4

## 2016-08-26 MED ORDER — PHENYLEPHRINE HCL 0.25 % NA SOLN
NASAL | Status: DC | PRN
  Administered 2016-08-26: 2 via NASAL

## 2016-08-26 MED ORDER — BUTAMBEN-TETRACAINE-BENZOCAINE 2-2-14 % EX AERO: 1.0000 | INHALATION_SPRAY | Freq: Once | CUTANEOUS | Status: DC

## 2016-08-26 MED ORDER — PHENYLEPHRINE HCL 0.25 % NA SOLN: 1.0000 | Freq: Four times a day (QID) | NASAL | Status: DC | PRN

## 2016-08-26 MED ORDER — LIDOCAINE HCL 2 % EX GEL
CUTANEOUS | Status: DC | PRN
  Administered 2016-08-26: 1

## 2016-08-26 NOTE — Progress Notes (Signed)
Date:  August 26, 2016 Chart reviewed for concurrent status and case management needs. Will continue to follow the patient for status change: Discharge Planning: following for needs Expected discharge date: [id number] Velva Harman, BSN, [place], [place]

## 2016-08-26 NOTE — Op Note (Addendum)
[facility] Cardiopulmonary Patient Name: Charles Bowman Procedure Date: 08/26/2016 MRN: [id number] Attending MD: Juanito Doom , MD Date of Birth: 04/05/1961 CSN: [id number] Age: 55 Admit Type: Inpatient Ethnicity: Not Hispanic or Latino Procedure:            Bronchoscopy Indications:          Hemoptysis with normal CXR Providers:            Nathaneil Canary B. Keisha Amer, MD, Andre Lefort, Phillis Knack Referring MD:          Medicines:            Lidocaine 2% Nebulizer 3 mL, Lidocaine 2% applied to                        cords 4 mL, Lidocaine 2% applied to the                        tracheobronchial tree 9 mL, Fentanyl 100 mcg IV,                        Midazolam 6 mg IV, None Complications:        No immediate complications Estimated Blood Loss: Estimated blood loss was minimal. Procedure:      Pre-Anesthesia Assessment:      - A History and Physical has been performed. Patient meds and allergies       have been reviewed. The risks and benefits of the procedure and the       sedation options and risks were discussed with the patient. All       questions were answered and informed consent was obtained. Patient       identification and proposed procedure were verified prior to the       procedure by the physician, the nurse and the technician in the       procedure room. Mental Status Examination: normal. Airway Examination:       normal oropharyngeal airway. Respiratory Examination: clear to       auscultation. CV Examination: normal. ASA Grade Assessment: II - A       patient with mild systemic disease. After reviewing the risks and       benefits, the patient was deemed in satisfactory condition to undergo       the procedure. The anesthesia plan was to use moderate sedation /       analgesia (conscious sedation). Immediately prior to administration of       medications, the patient was re-assessed for adequacy to receive       sedatives. The heart rate, respiratory  rate, oxygen saturations, blood       pressure, adequacy of pulmonary ventilation, and response to care were       monitored throughout the procedure. The physical status of the patient       was re-assessed after the procedure.      After obtaining informed consent, the bronchoscope was passed under       direct vision. Throughout the procedure, the patient's blood pressure,       pulse, and oxygen saturations were monitored continuously. the [id number]       [id number]) scope was introduced through the right nostril and advanced to       the tracheobronchial tree of both lungs. The procedure was accomplished       without difficulty. The patient tolerated the  procedure well. The total       duration of the procedure was 14 minutes. Findings:      The nasopharynx/oropharynx appears normal. The larynx appears normal.       The vocal cords appear normal. The subglottic space is normal. The       trachea is of normal caliber. The carina is sharp. The tracheobronchial       tree of the left lung was examined to at least the first subsegmental       level. Bronchial mucosa and anatomy in the left lung are normal; there       are no endobronchial lesions, and no secretions.      Right Lung Abnormalities: A blot clot was found in the posterior segment       of the right upper lobe (B2). It was completely obstructing the airway.       The clot was successfully removed, but the lesion began bleeding. BAL       was performed in the RUL posterior segment (B2) of the lung and sent for       bacterial, AFB and fungal analysis. 40 mL of fluid were instilled. 10 mL       were returned. The return was bloody. There were no mucoid plugs in the       return fluid. I then washed the right upper lobe posterior segment with       sterile saline and no underlying mass or lesion could be appreciated       (though the view was obscured somewhat by the bleeding).      The remainder of the tracheobronchial tree is  normal. Impression:      - Hemoptysis with normal CXR      - The left lung was normal.      - A blood clot was found in the posterior segment of the right upper       lobe (B2).      - Bronchoalveolar lavage was performed. Moderate Sedation:      Moderate (conscious) sedation was personally administered by the       endoscopist. The following parameters were monitored: oxygen saturation,       heart rate, blood pressure, and response to care. Total physician       intraservice time was 18 minutes. Recommendation:      - Await culture results. Procedure Code(s):      --- Professional ---      [phone], Bronchoscopy, rigid or flexible, including fluoroscopic guidance,       when performed; with bronchial alveolar lavage      [id number], Moderate sedation services provided by the same physician or       other qualified health care professional performing the diagnostic or       therapeutic service that the sedation supports, requiring the presence       of an independent trained observer to assist in the monitoring of the       patient's level of consciousness and physiological status; initial 15       minutes of intraservice time, patient age 86 years or older Diagnosis Code(s):      --- Professional ---      R04.2, Hemoptysis CPT copyright 2016 [employer]. All rights reserved. The codes documented in this report are preliminary and upon coder review may  be revised to meet current compliance requirements. Norlene Campbell, MD Juanito Doom, MD 08/26/2016 8:48:01  AM This report has been signed electronically. Number of Addenda: 0 Scope In: 8:08:11 AM Scope Out: 8:19:08 AM

## 2016-08-26 NOTE — Progress Notes (Addendum)
PROGRESS NOTE                                                                                                                                                                                                             Patient Demographics:    Charles Bowman, is a 55 y.o. male, DOB - [birth year], [id number]  Admit date - 08/22/2016   Admitting Physician [name], MD  Outpatient Primary MD for the patient is [name], DO  LOS - 3  Outpatient Specialists:None  Chief Complaint  Patient presents with  . 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   Subjective:   Still having hemoptysis, became acutely short of breath while coughing and a bout of hemoptysis, followed by o2 desats to 70s requiring high flow [place]. Improved gradually. Returned from [employer] this am. Feels fine. informs being on ativan 0.5 mg 2-3 times a day as needed for anxiety and yesterday the anxiety got worse when he was dyspneic.   Assessment  & Plan :    Principal Problem:   Hemoptysis secondary to posterior RUL blood clot  CT angiogram of the chest concerning for pneumonia. PC CM following. Sen by ENT and no signs of upper airway bleeding.  bronchoscopy done this am showing:     " A blot clot was found in the posterior segment of the right upper lobe (B2). It was completely obstructing the airway.The clot was successfully removed, but the lesion began bleeding. BAL       was performed in the RUL posterior segment (B2) of the lung and sent for bacterial, AFB and fungal analysis." ASA and anagrelide discontinued. H&H stable.  Monitor in stepdown another day. D/w Dr [place] Bells, surprisingly has bleeding in the same spot  as seen during bronchoscopy in 2009  if has severe/ Active hemoptysis, will need IR  embolization.    Active Problems: ? lobar pneumonia Afebrile. Stable on room air with occasional dropping O2 sat to high 80s. D/c abx after today.    Essential thrombocythemia ([place]) Hold ASA and acrylin. Platelets stable. Follows with Dr [name]. Will d/w her regarding holding agrylin and ASA.    Hypothyroidism Continue Synthroid.  OSA Continue CPAP     Essential  hypertension Poorly controlled. Home medications adjusted. 2-D echo shows EF of 45-50% with diffuse hypokinesis. Dr [name] Labs discussed with cardiology who recommended this likely from poorly controlled hypertension and patient should follow-up with his primary cardiologist Dr. [name] as outpatient.  Hyponatremia Mild, secondary to dehydration. Resolved with fluids     Diabetes mellitus type 2, uncomplicated (HCC) Continue sliding scale coverage. Holding metformin and Amaryl  Anxiety  low dose ativan prn      Code Status : Full code  Family Communication  : None at bedside  Disposition Plan  : stepdown monitoring   Barriers For Discharge : Active hemoptysis, needs monitoring in the hospital for another 2-3 days  Consults  :   PC CM ENT  Procedures  : CT angiogram of the chest 2-D echo Bronchoscopy with bx  DVT Prophylaxis  :  SCDs  Lab Results  Component Value Date   PLT 272 08/26/2016    Antibiotics  :    Anti-infectives    Start     Dose/Rate Route Frequency Ordered Stop   08/23/16 0600  vancomycin (VANCOCIN) IVPB 1000 mg/200 mL premix  Status:  Discontinued     1,000 mg 200 mL/hr over 60 Minutes Intravenous Every 8 hours 08/22/16 1942 08/25/16 0835   08/23/16 0200  piperacillin-tazobactam (ZOSYN) IVPB 3.375 g     3.375 g 12.5 mL/hr over 240 Minutes Intravenous Every 8 hours 08/22/16 1942     08/22/16 2000  vancomycin (VANCOCIN) 1,500 mg in sodium chloride 0.9 % 500 mL IVPB     1,500 mg 250 mL/hr over 120 Minutes Intravenous  Once 08/22/16 1942 08/22/16 2307   08/22/16 2000   piperacillin-tazobactam (ZOSYN) IVPB 3.375 g     3.375 g 100 mL/hr over 30 Minutes Intravenous  Once 08/22/16 1942 08/22/16 2059        Objective:   Vitals:   08/26/16 0815 08/26/16 0820 08/26/16 0824 08/26/16 0825  BP: (!) 151/62 (!) 176/69  (!) 142/82  Pulse:   90 90  Resp:   13   Temp:      TempSrc:      SpO2:   100%   Weight:      Height:        Wt Readings from Last 3 Encounters:  08/22/16 87.4 kg (192 lb 10.9 oz)  08/20/16 89.1 kg (196 lb 6 oz)  05/23/16 89 kg (196 lb 1.6 oz)     Intake/Output Summary (Last 24 hours) at 08/26/16 0848 Last data filed at 08/26/16 0600  Gross per 24 hour  Intake             1110 ml  Output              600 ml  Net              510 ml     Physical Exam  Gen: not in distress HEENT: no pallor, moist mucosa, supple neck Chest: clear b/l, fine rt base crackles CVS: N S1&S2, no murmurs,  GI: soft, NT, ND, BS+ Musculoskeletal: warm, no edema     Data Review:    CBC  Recent Labs Lab 08/20/16 1605 08/22/16 0950 08/23/16 0326 08/24/16 0358 08/25/16 0311 08/26/16 0316  WBC 5.3 5.9 4.6 8.2 6.3 5.4  HGB 13.0 13.8 11.8* 13.1 12.2* 11.3*  HCT 40.2 40.8 36.7* 40.4 38.3* 35.1*  PLT 196 222 193 263 266 272  MCV 82.4 79.8 82.3 82.8 83.8 83.8  MCH 26.6 27.0 26.5 26.8 26.7  27.0  MCHC 32.3 33.8 32.2 32.4 31.9 32.2  RDW 15.5 15.2 15.7* 16.1* 16.4* 16.3*  LYMPHSABS 1.2  --   --   --   --   --   MONOABS 0.4  --   --   --   --   --   EOSABS 0.1  --   --   --   --   --   BASOSABS 0.0  --   --   --   --   --     Chemistries   Recent Labs Lab 08/20/16 1605 08/22/16 0950 08/23/16 0326 08/24/16 0358 08/25/16 0311  NA 136 134* 137 136 138  K 4.1 4.3 4.3 4.4 4.2  CL 101 98* 103 99* 101  CO2 26 26 27 28 30   GLUCOSE 151* 270* 141* 197* 154*  BUN 21* 23* 19 20 22*  CREATININE 0.99 1.12 0.93 0.94 1.10  CALCIUM 9.2 9.6 8.8* 9.7 9.3  AST 27 29  --   --   --   ALT 38 39  --   --   --   ALKPHOS 92 97  --   --   --   BILITOT 0.7  0.8  --   --   --    ------------------------------------------------------------------------------------------------------------------ No results for input(s): CHOL, HDL, LDLCALC, TRIG, CHOLHDL, LDLDIRECT in the last 72 hours.  Lab Results  Component Value Date   HGBA1C 7.3 01/25/2015   ------------------------------------------------------------------------------------------------------------------ No results for input(s): TSH, T4TOTAL, T3FREE, THYROIDAB in the last 72 hours.  Invalid input(s): FREET3 ------------------------------------------------------------------------------------------------------------------ No results for input(s): VITAMINB12, FOLATE, FERRITIN, TIBC, IRON, RETICCTPCT in the last 72 hours.  Coagulation profile  Recent Labs Lab 08/22/16 0954  INR 0.94    No results for input(s): DDIMER in the last 72 hours.  Cardiac Enzymes No results for input(s): CKMB, TROPONINI, MYOGLOBIN in the last 168 hours.  Invalid input(s): CK ------------------------------------------------------------------------------------------------------------------    Component Value Date/Time   BNP 34.6 08/20/2016 1605    Inpatient Medications  Scheduled Meds: . atorvastatin  20 mg Oral Daily  . diltiazem  420 mg Oral Daily  . hydrochlorothiazide  12.5 mg Oral Daily  . insulin aspart  0-15 Units Subcutaneous TID WC  . insulin aspart  0-5 Units Subcutaneous QHS  . levothyroxine  175 mcg Oral QHS  . lisinopril  40 mg Oral Daily  . piperacillin-tazobactam (ZOSYN)  IV  3.375 g Intravenous Q8H  . sodium chloride  1 spray Each Nare BID   Continuous Infusions:  PRN Meds:.acetaminophen **OR** acetaminophen, hydrALAZINE, HYDROcodone-acetaminophen, LORazepam, ondansetron **OR** ondansetron (ZOFRAN) IV, zolpidem  Micro Results Recent Results (from the past 240 hour(s))  MRSA PCR Screening     Status: None   Collection Time: 08/22/16  6:50 PM  Result Value Ref Range Status   MRSA  by PCR NEGATIVE NEGATIVE Final    Comment:        The GeneXpert MRSA Assay (FDA approved for NASAL specimens only), is one component of a comprehensive MRSA colonization surveillance program. It is not intended to diagnose MRSA infection nor to guide or monitor treatment for MRSA infections.     Radiology Reports Dg Chest 2 View  Result Date: 08/20/2016 CLINICAL DATA:  Hemoptysis. EXAM: CHEST  2 VIEW COMPARISON:  Radiographs of January 23, 2016. FINDINGS: The heart size and mediastinal contours are within normal limits. Both lungs are clear. No pneumothorax or pleural effusion is noted. The visualized skeletal structures are unremarkable. IMPRESSION: No active cardiopulmonary disease. Electronically  Signed   By: Marijo Conception, M.D.   On: 08/20/2016 10:29   Ct Chest W Contrast  Result Date: 08/20/2016 CLINICAL DATA:  Hemoptysis and history of thrombocytopenia EXAM: CT CHEST WITH CONTRAST TECHNIQUE: Multidetector CT imaging of the chest was performed during intravenous contrast administration. CONTRAST:  22mL ISOVUE-300 IOPAMIDOL (ISOVUE-300) INJECTION 61% COMPARISON:  Same day chest radiographs FINDINGS: Cardiovascular: No significant vascular findings. Mild atherosclerosis of the aorta. Normal heart size. No pericardial effusion. Mediastinum/Nodes: No axillary, supraclavicular, mediastinal nor hilar lymphadenopathy. Small hiatal hernia. Lungs/Pleura: No evidence of bronchiectasis. A few small right apical pulmonary blebs are noted. Centrilobular emphysema, upper lobe predominant. No pneumonic consolidation, effusion or CHF. Upper Abdomen: No acute abnormality. Musculoskeletal: No chest wall abnormality. No acute or significant osseous findings. IMPRESSION: Upper lobe predominant centrilobular emphysema. No acute cardiopulmonary disease. Aortic atherosclerosis. Electronically Signed   By: Ashley Royalty M.D.   On: 08/20/2016 17:43   Ct Angio Chest Pe W Or Wo Contrast  Result Date:  08/22/2016 CLINICAL DATA:  Hemoptysis.  Thrombocytosis. EXAM: CT ANGIOGRAPHY CHEST WITH CONTRAST TECHNIQUE: Multidetector CT imaging of the chest was performed using the standard protocol during bolus administration of intravenous contrast. Multiplanar CT image reconstructions and MIPs were obtained to evaluate the vascular anatomy. CONTRAST:  100 cc Isovue 370 COMPARISON:  CT chest from 2 days ago, 08/20/2016 FINDINGS: Cardiovascular: No filling defect is identified in the pulmonary arterial tree to suggest pulmonary embolus. Mild atherosclerotic calcification of the aortic arch. Mediastinum/Nodes: No obvious esophageal abnormality. No adenopathy. Lungs/Pleura: Centrilobular emphysema. Mosaic attenuation in the lower lobes and right middle lobe and to a lesser extent posteriorly in the right upper lobe, subtle sub solid nodularity in a centrilobular distribution. Upper Abdomen: Unremarkable Musculoskeletal: Unremarkable Review of the MIP images confirms the above findings. IMPRESSION: 1. No filling defect is identified in the pulmonary arterial tree to suggest pulmonary embolus. 2. Very faint centrilobular sub solid nodularity causing a mosaic attenuation appearance primarily in the right middle lobe and in the lower lobes. Differential diagnostic considerations favor hypersensitivity pneumonitis, respiratory bronchiolitis, or atypical infectious process. 3. Centrilobular emphysema. Electronically Signed   By: Van Clines M.D.   On: 08/22/2016 18:13   Dg Chest Port 1 View  Result Date: 08/24/2016 CLINICAL DATA:  55 year old male with hemoptysis. EXAM: PORTABLE CHEST 1 VIEW COMPARISON:  Chest CT dated 08/22/2016 FINDINGS: A faint area of increased density noted at the right lung base which may represent normal pulmonary vasculature or correspond to the hazy and ground-glass density noted on the CT. The left lung is clear. There is no pleural effusion or pneumothorax. The cardiac silhouette is within  normal limits with no acute osseous pathology. IMPRESSION: Normal pulmonary vasculature versus airspace haziness at the right lung base. No focal consolidation. Electronically Signed   By: Anner Crete M.D.   On: 08/24/2016 05:59   Dg Chest Port 1 View  Result Date: 08/22/2016 CLINICAL DATA:  Hemoptysis. EXAM: PORTABLE CHEST 1 VIEW COMPARISON:  Radiographs and CT scan of August 20, 2016. FINDINGS: The heart size and mediastinal contours are within normal limits. Both lungs are clear. No pneumothorax or pleural effusion is noted. The visualized skeletal structures are unremarkable. IMPRESSION: No acute cardiopulmonary abnormality seen. Electronically Signed   By: Marijo Conception, M.D.   On: 08/22/2016 15:52    Time Spent in minutes  35   Louellen Molder M.D on 08/26/2016 at 8:48 AM  Between 7am to 7pm - Pager - [phone]  After  7pm go to [email or web address] - password [facility]  [facility] -  Office  [phone]

## 2016-08-26 NOTE — Progress Notes (Signed)
Video Bronchoscopy done  Intervention Bronchial washing done 

## 2016-08-26 NOTE — H&P (Signed)
[facility]  HPI: Patient known to our service for a 2009 episode of hemoptysis requiring a bronchoscopy (had e.coli pneumonia then) returns with hemoptysis.  ENT evaluation on 10/23 did not reveal evidence of bleeding in sinuses or throat.  Past Medical History:  Diagnosis Date  . Anemia   . Anxiety 05/11/2014  . Cancer ([place])   . Diabetes mellitus without complication ([place])   . Hypertension   . Murmur 11/09/2014  . Thyroid disease      Family History  Problem Relation Age of Onset  . Cancer Father     prostate ca  . Dementia Father 52    2008  . COPD Mother     +Tobacco Use     Social History   Social History  . Marital status: Married    Spouse name: Angie  . Number of children: 3  . Years of education: 11th/GED   Occupational History  . CMM Supervisor     Psychologist, educational, Automotive engineer   Social History Main Topics  . Smoking status: Former Smoker    Packs/day: 1.50    Years: 20.00    Types: Cigarettes    Quit date: 09/02/2008  . Smokeless tobacco: Never Used  . Alcohol use No  . Drug use: No  . Sexual activity: Yes   Other Topics Concern  . Not on file   Social History Narrative   Lives with his wife and daughter and 2 step-sons. His other daughters live in [place] and [place].     No Known Allergies   @[email or web address] @ Vitals:   08/26/16 0000 08/26/16 0200 08/26/16 0400 08/26/16 0600  BP: 136/67 140/69 (!) 113/58 132/60  Pulse: 72 67 (!) 55 (!) 59  Resp: 18 13 12 14   Temp:   98.1 F (36.7 C)   TempSrc:      SpO2: 97% 97% 97% 98%  Weight:      Height:       Gen: well appearing HENT: OP clear, neck supple PULM: CTA B, normal percussion CV: RRR, no mgr, trace edema GI: BS+, soft, nontender Derm: no cyanosis or rash Psyche: normal mood and affect   CBC    Component Value Date/Time   WBC 5.4 08/26/2016 0316   RBC 4.19 (L) 08/26/2016 0316   HGB 11.3 (L) 08/26/2016 0316   HGB 13.1 05/23/2016 0833   HCT 35.1 (L) 08/26/2016 0316   HCT 40.4  05/23/2016 0833   PLT 272 08/26/2016 0316   PLT 194 05/23/2016 0833   MCV 83.8 08/26/2016 0316   MCV 80.2 05/23/2016 0833   MCH 27.0 08/26/2016 0316   MCHC 32.2 08/26/2016 0316   RDW 16.3 (H) 08/26/2016 0316   RDW 16.6 (H) 05/23/2016 0833   LYMPHSABS 1.2 08/20/2016 1605   LYMPHSABS 1.0 05/23/2016 0833   MONOABS 0.4 08/20/2016 1605   MONOABS 0.3 05/23/2016 0833   EOSABS 0.1 08/20/2016 1605   EOSABS 0.1 05/23/2016 0833   BASOSABS 0.0 08/20/2016 1605   BASOSABS 0.0 05/23/2016 0833   Impression/Plan Hemoptysis, uncertain etiology.  Plan bronchoscopy with BAL today.  The patient was informed of the risks and benefits and is willing to proceed.  Roselie Awkward, MD [place] [facility] Pager: [phone] Cell: [phone] After 3pm or if no response, call [phone]

## 2016-08-26 NOTE — Progress Notes (Signed)
[facility]  Bronchoscopy this morning showed a large blood clot in the posterior segment of the right upper lobe, no underlying mass but image obscured somewhat from bleeding.  BAL ordered.  Continue monitoring in Step Down.  If bleeding abruptly worsens then consult IR for embolization.  Wife updated.  Roselie Awkward, MD [place] [facility] Pager: [phone] Cell: [phone] After 3pm or if no response, call [phone]

## 2016-08-27 ENCOUNTER — Telehealth: Payer: Self-pay | Admitting: Pulmonary Disease

## 2016-08-27 ENCOUNTER — Encounter (HOSPITAL_COMMUNITY): Payer: Self-pay | Admitting: Pulmonary Disease

## 2016-08-27 DIAGNOSIS — D638 Anemia in other chronic diseases classified elsewhere: Secondary | ICD-10-CM

## 2016-08-27 DIAGNOSIS — I10 Essential (primary) hypertension: Secondary | ICD-10-CM

## 2016-08-27 LAB — GLUCOSE, CAPILLARY
GLUCOSE-CAPILLARY: 183 mg/dL — AB (ref 65–99)
GLUCOSE-CAPILLARY: 168 mg/dL — AB (ref 65–99)
Glucose-Capillary: 174 mg/dL — ABNORMAL HIGH (ref 65–99)
Glucose-Capillary: 173 mg/dL — ABNORMAL HIGH (ref 65–99)

## 2016-08-27 LAB — CBC
HCT: 34.3 % — ABNORMAL LOW (ref 39.0–52.0)
Hemoglobin: 11 g/dL — ABNORMAL LOW (ref 13.0–17.0)
MCH: 27 pg (ref 26.0–34.0)
MCHC: 32.1 g/dL (ref 30.0–36.0)
MCV: 84.1 fL (ref 78.0–100.0)
PLATELETS: 336 10*3/uL (ref 150–400)
RBC: 4.08 MIL/uL — AB (ref 4.22–5.81)
RDW: 16.3 % — ABNORMAL HIGH (ref 11.5–15.5)
WBC: 5.5 10*3/uL (ref 4.0–10.5)

## 2016-08-27 LAB — ACID FAST SMEAR (AFB)

## 2016-08-27 LAB — ACID FAST SMEAR (AFB, MYCOBACTERIA): Acid Fast Smear: NEGATIVE

## 2016-08-27 NOTE — Telephone Encounter (Signed)
Called and spoke to pt. Pt currently admitted and spoke with BQ earlier today regarding hemoptysis and to either treat with abx vs embolization. Pt states he spoke with IR about the possible embolization. Pt states IR advised against embolization. Pt would like to speak with BQ about this, today if possible.  BQ please advise. Thanks.

## 2016-08-27 NOTE — Progress Notes (Addendum)
PROGRESS NOTE                                                                                                                                                                                                             Patient Demographics:    Charles Bowman, is a 55 y.o. male, DOB - 05-19-1961, [id number]  Admit date - 08/22/2016   Admitting Physician [name], MD  Outpatient Primary MD for the patient is [name], DO  LOS - 4  Outpatient Specialists:None  Chief Complaint  Patient presents with  . 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   Subjective:   3 episodes of hemoptysis last night, hemoglobin stable around 11.  No more episodes this am. Off oxygen.    Assessment  & Plan :    Principal Problem:   Hemoptysis CT angiogram of the chest concerning for pneumonia. PC CM following. Seen by ENT and no signs of upper airway bleeding.  bronchoscopy done revealed     " A blot clot was found in the posterior segment of the right upper lobe (B2). It was completely obstructing the airway.The clot was successfully removed, but the lesion began bleeding. BAL       was performed in the RUL posterior segment (B2) of the lung and sent for bacterial, AFB and fungal analysis." ASA and anagrelide discontinued. Discussed with Dr [name].  H&H stable.    Episodes of hemoptysis since last night, PCCM recommending IR evalution for Embolization.     Active Problems: ? lobar pneumonia Afebrile. Doesn't appear toxic, white count normal. . Would resule zosyn, as Bronchoscopy shows an area of bronchiectasis.      Essential thrombocythemia ([place]) Hold ASA and acrylin. Platelets stable. Follows with Dr [name]. Discussed with her regarding holding agrylin and ASA.     Hypothyroidism Continue Synthroid.  OSA Continue CPAP     Essential hypertension Sub optimal. Recheck this afternoon. 2-D echo shows EF of 45-50% with diffuse hypokinesis. Dr [name] Labs discussed with cardiology who recommended this likely from poorly controlled hypertension and patient should follow-up with his primary cardiologist Dr. [name] as outpatient.  Hyponatremia Mild, secondary to dehydration. Resolved with fluids     Diabetes mellitus type 2,  uncomplicated (HCC) Continue sliding scale coverage. Holding metformin and Amaryl. CBG (last 3)   Recent Labs  08/26/16 1704 08/26/16 2134 08/27/16 0822  GLUCAP 203* 147* 174*      Anxiety  low dose ativan prn      Code Status : Full code  Family Communication  : None at bedside  Disposition Plan  : possible transfer to telemetry today.   Barriers For Discharge : possible d/c in 2 days.   Consults  :   PC CM ENT  Procedures  : CT angiogram of the chest 2-D echo Bronchoscopy with bx  DVT Prophylaxis  :  SCDs  Lab Results  Component Value Date   PLT 336 08/27/2016    Antibiotics  :    Anti-infectives    Start     Dose/Rate Route Frequency Ordered Stop   08/23/16 0600  vancomycin (VANCOCIN) IVPB 1000 mg/200 mL premix  Status:  Discontinued     1,000 mg 200 mL/hr over 60 Minutes Intravenous Every 8 hours 08/22/16 1942 08/25/16 0835   08/23/16 0200  piperacillin-tazobactam (ZOSYN) IVPB 3.375 g     3.375 g 12.5 mL/hr over 240 Minutes Intravenous Every 8 hours 08/22/16 1942     08/22/16 2000  vancomycin (VANCOCIN) 1,500 mg in sodium chloride 0.9 % 500 mL IVPB     1,500 mg 250 mL/hr over 120 Minutes Intravenous  Once 08/22/16 1942 08/22/16 2307   08/22/16 2000  piperacillin-tazobactam (ZOSYN) IVPB 3.375 g     3.375 g 100 mL/hr over 30 Minutes Intravenous  Once 08/22/16 1942 08/22/16 2059        Objective:   Vitals:   08/27/16 0130 08/27/16 0410 08/27/16 0615 08/27/16 0800  BP: (!) 142/66  (!)  160/73   Pulse: (!) 56  91   Resp: 13  11   Temp:  97.7 F (36.5 C)  97.8 F (36.6 C)  TempSrc:  Oral  Axillary  SpO2: 100%  96%   Weight:      Height:        Wt Readings from Last 3 Encounters:  08/22/16 87.4 kg (192 lb 10.9 oz)  08/20/16 89.1 kg (196 lb 6 oz)  05/23/16 89 kg (196 lb 1.6 oz)     Intake/Output Summary (Last 24 hours) at 08/27/16 0928 Last data filed at 08/27/16 0600  Gross per 24 hour  Intake              390 ml  Output             1450 ml  Net            -1060 ml     Physical Exam  Gen: alert afebrile comfortable.  HEENT: no pallor, moist mucosa, supple neck Chest: clear b/l, no wheezing or rhonchi.  CVS: N S1&S2, no murmurs,  GI: soft, NT, ND, BS+ Musculoskeletal: warm, no edema     Data Review:    CBC  Recent Labs Lab 08/20/16 1605  08/23/16 0326 08/24/16 0358 08/25/16 0311 08/26/16 0316 08/27/16 0330  WBC 5.3  < > 4.6 8.2 6.3 5.4 5.5  HGB 13.0  < > 11.8* 13.1 12.2* 11.3* 11.0*  HCT 40.2  < > 36.7* 40.4 38.3* 35.1* 34.3*  PLT 196  < > 193 263 266 272 336  MCV 82.4  < > 82.3 82.8 83.8 83.8 84.1  MCH 26.6  < > 26.5 26.8 26.7 27.0 27.0  MCHC 32.3  < > 32.2 32.4 31.9 32.2  32.1  RDW 15.5  < > 15.7* 16.1* 16.4* 16.3* 16.3*  LYMPHSABS 1.2  --   --   --   --   --   --   MONOABS 0.4  --   --   --   --   --   --   EOSABS 0.1  --   --   --   --   --   --   BASOSABS 0.0  --   --   --   --   --   --   < > = values in this interval not displayed.  Chemistries   Recent Labs Lab 08/20/16 1605 08/22/16 0950 08/23/16 0326 08/24/16 0358 08/25/16 0311  NA 136 134* 137 136 138  K 4.1 4.3 4.3 4.4 4.2  CL 101 98* 103 99* 101  CO2 26 26 27 28 30   GLUCOSE 151* 270* 141* 197* 154*  BUN 21* 23* 19 20 22*  CREATININE 0.99 1.12 0.93 0.94 1.10  CALCIUM 9.2 9.6 8.8* 9.7 9.3  AST 27 29  --   --   --   ALT 38 39  --   --   --   ALKPHOS 92 97  --   --   --   BILITOT 0.7 0.8  --   --   --     ------------------------------------------------------------------------------------------------------------------ No results for input(s): CHOL, HDL, LDLCALC, TRIG, CHOLHDL, LDLDIRECT in the last 72 hours.  Lab Results  Component Value Date   HGBA1C 7.3 01/25/2015   ------------------------------------------------------------------------------------------------------------------ No results for input(s): TSH, T4TOTAL, T3FREE, THYROIDAB in the last 72 hours.  Invalid input(s): FREET3 ------------------------------------------------------------------------------------------------------------------ No results for input(s): VITAMINB12, FOLATE, FERRITIN, TIBC, IRON, RETICCTPCT in the last 72 hours.  Coagulation profile  Recent Labs Lab 08/22/16 0954  INR 0.94    No results for input(s): DDIMER in the last 72 hours.  Cardiac Enzymes No results for input(s): CKMB, TROPONINI, MYOGLOBIN in the last 168 hours.  Invalid input(s): CK ------------------------------------------------------------------------------------------------------------------    Component Value Date/Time   BNP 34.6 08/20/2016 1605    Inpatient Medications  Scheduled Meds: . atorvastatin  20 mg Oral Daily  . diltiazem  420 mg Oral Daily  . hydrochlorothiazide  12.5 mg Oral Daily  . insulin aspart  0-15 Units Subcutaneous TID WC  . insulin aspart  0-5 Units Subcutaneous QHS  . levothyroxine  175 mcg Oral QHS  . lisinopril  40 mg Oral Daily  . piperacillin-tazobactam (ZOSYN)  IV  3.375 g Intravenous Q8H  . sodium chloride  1 spray Each Nare BID   Continuous Infusions:  PRN Meds:.acetaminophen **OR** acetaminophen, hydrALAZINE, HYDROcodone-acetaminophen, LORazepam, ondansetron **OR** ondansetron (ZOFRAN) IV, zolpidem  Micro Results Recent Results (from the past 240 hour(s))  MRSA PCR Screening     Status: None   Collection Time: 08/22/16  6:50 PM  Result Value Ref Range Status   MRSA by PCR NEGATIVE  NEGATIVE Final    Comment:        The GeneXpert MRSA Assay (FDA approved for NASAL specimens only), is one component of a comprehensive MRSA colonization surveillance program. It is not intended to diagnose MRSA infection nor to guide or monitor treatment for MRSA infections.   Culture, respiratory (NON-Expectorated)     Status: None (Preliminary result)   Collection Time: 08/26/16  8:58 AM  Result Value Ref Range Status   Specimen Description BRONCHIAL ALVEOLAR LAVAGE  Final   Special Requests Normal  Final   Gram Stain  Final    FEW WBC PRESENT,BOTH PMN AND MONONUCLEAR NO ORGANISMS SEEN    Culture PENDING  Incomplete   Report Status PENDING  Incomplete    Radiology Reports Dg Chest 2 View  Result Date: 08/20/2016 CLINICAL DATA:  Hemoptysis. EXAM: CHEST  2 VIEW COMPARISON:  Radiographs of January 23, 2016. FINDINGS: The heart size and mediastinal contours are within normal limits. Both lungs are clear. No pneumothorax or pleural effusion is noted. The visualized skeletal structures are unremarkable. IMPRESSION: No active cardiopulmonary disease. Electronically Signed   By: Marijo Conception, M.D.   On: 08/20/2016 10:29   Ct Chest W Contrast  Result Date: 08/20/2016 CLINICAL DATA:  Hemoptysis and history of thrombocytopenia EXAM: CT CHEST WITH CONTRAST TECHNIQUE: Multidetector CT imaging of the chest was performed during intravenous contrast administration. CONTRAST:  51mL ISOVUE-300 IOPAMIDOL (ISOVUE-300) INJECTION 61% COMPARISON:  Same day chest radiographs FINDINGS: Cardiovascular: No significant vascular findings. Mild atherosclerosis of the aorta. Normal heart size. No pericardial effusion. Mediastinum/Nodes: No axillary, supraclavicular, mediastinal nor hilar lymphadenopathy. Small hiatal hernia. Lungs/Pleura: No evidence of bronchiectasis. A few small right apical pulmonary blebs are noted. Centrilobular emphysema, upper lobe predominant. No pneumonic consolidation, effusion or  CHF. Upper Abdomen: No acute abnormality. Musculoskeletal: No chest wall abnormality. No acute or significant osseous findings. IMPRESSION: Upper lobe predominant centrilobular emphysema. No acute cardiopulmonary disease. Aortic atherosclerosis. Electronically Signed   By: Ashley Royalty M.D.   On: 08/20/2016 17:43   Ct Angio Chest Pe W Or Wo Contrast  Result Date: 08/22/2016 CLINICAL DATA:  Hemoptysis.  Thrombocytosis. EXAM: CT ANGIOGRAPHY CHEST WITH CONTRAST TECHNIQUE: Multidetector CT imaging of the chest was performed using the standard protocol during bolus administration of intravenous contrast. Multiplanar CT image reconstructions and MIPs were obtained to evaluate the vascular anatomy. CONTRAST:  100 cc Isovue 370 COMPARISON:  CT chest from 2 days ago, 08/20/2016 FINDINGS: Cardiovascular: No filling defect is identified in the pulmonary arterial tree to suggest pulmonary embolus. Mild atherosclerotic calcification of the aortic arch. Mediastinum/Nodes: No obvious esophageal abnormality. No adenopathy. Lungs/Pleura: Centrilobular emphysema. Mosaic attenuation in the lower lobes and right middle lobe and to a lesser extent posteriorly in the right upper lobe, subtle sub solid nodularity in a centrilobular distribution. Upper Abdomen: Unremarkable Musculoskeletal: Unremarkable Review of the MIP images confirms the above findings. IMPRESSION: 1. No filling defect is identified in the pulmonary arterial tree to suggest pulmonary embolus. 2. Very faint centrilobular sub solid nodularity causing a mosaic attenuation appearance primarily in the right middle lobe and in the lower lobes. Differential diagnostic considerations favor hypersensitivity pneumonitis, respiratory bronchiolitis, or atypical infectious process. 3. Centrilobular emphysema. Electronically Signed   By: Van Clines M.D.   On: 08/22/2016 18:13   Dg Chest Port 1 View  Result Date: 08/24/2016 CLINICAL DATA:  55 year old male with  hemoptysis. EXAM: PORTABLE CHEST 1 VIEW COMPARISON:  Chest CT dated 08/22/2016 FINDINGS: A faint area of increased density noted at the right lung base which may represent normal pulmonary vasculature or correspond to the hazy and ground-glass density noted on the CT. The left lung is clear. There is no pleural effusion or pneumothorax. The cardiac silhouette is within normal limits with no acute osseous pathology. IMPRESSION: Normal pulmonary vasculature versus airspace haziness at the right lung base. No focal consolidation. Electronically Signed   By: Anner Crete M.D.   On: 08/24/2016 05:59   Dg Chest Port 1 View  Result Date: 08/22/2016 CLINICAL DATA:  Hemoptysis. EXAM: PORTABLE CHEST 1  VIEW COMPARISON:  Radiographs and CT scan of August 20, 2016. FINDINGS: The heart size and mediastinal contours are within normal limits. Both lungs are clear. No pneumothorax or pleural effusion is noted. The visualized skeletal structures are unremarkable. IMPRESSION: No acute cardiopulmonary abnormality seen. Electronically Signed   By: Marijo Conception, M.D.   On: 08/22/2016 15:52    Time Spent in minutes  35   Pascuala Klutts M.D on 08/27/2016 at 9:28 AM  Between 7am to 7pm - Pager - [phone]  After 7pm go to [email or web address] - password [email or web address]  [facility] -  Office  [phone]

## 2016-08-27 NOTE — Telephone Encounter (Signed)
Let him know that I agree with Dr. Moises Blood assessment to watch him at this time overnight and I will see him again on rounds tomorrow morning.

## 2016-08-27 NOTE — Telephone Encounter (Signed)
Called and spoke to pt. Informed him of the recs per BQ. Pt verbalized understanding and denied any further questions or concerns at this time.

## 2016-08-27 NOTE — Progress Notes (Signed)
Pt experienced one episode of hemoptysis. Coughed up 37ml bright red blood. No further hemoptysis observed. RN will continue to monitor. Evorn Gong, RN

## 2016-08-27 NOTE — Progress Notes (Addendum)
[facility] PCCM  S: Had hemoptysis overnight again, some hypoxemia; feels OK, now back to RA  O: Vitals:   08/27/16 0130 08/27/16 0410 08/27/16 0615 08/27/16 0800  BP: (!) 142/66  (!) 160/73   Pulse: (!) 56  91   Resp: 13  11   Temp:  97.7 F (36.5 C)  97.8 F (36.6 C)  TempSrc:  Oral  Axillary  SpO2: 100%  96%   Weight:      Height:       O2 saturation now on RA  Gen: well appearing HENT: OP clear, neck supple PULM: CTA B, normal percussion CV: RRR, no mgr, trace edema GI: BS+, soft, nontender Derm: no cyanosis or rash Psyche: normal mood and affect  CBC    Component Value Date/Time   WBC 5.5 08/27/2016 0330   RBC 4.08 (L) 08/27/2016 0330   HGB 11.0 (L) 08/27/2016 0330   HGB 13.1 05/23/2016 0833   HCT 34.3 (L) 08/27/2016 0330   HCT 40.4 05/23/2016 0833   PLT 336 08/27/2016 0330   PLT 194 05/23/2016 0833   MCV 84.1 08/27/2016 0330   MCV 80.2 05/23/2016 0833   MCH 27.0 08/27/2016 0330   MCHC 32.1 08/27/2016 0330   RDW 16.3 (H) 08/27/2016 0330   RDW 16.6 (H) 05/23/2016 0833   LYMPHSABS 1.2 08/20/2016 1605   LYMPHSABS 1.0 05/23/2016 0833   MONOABS 0.4 08/20/2016 1605   MONOABS 0.3 05/23/2016 0833   EOSABS 0.1 08/20/2016 1605   EOSABS 0.1 05/23/2016 0833   BASOSABS 0.0 08/20/2016 1605   BASOSABS 0.0 05/23/2016 0833   Impression/Plan:    Hemoptysis: specifically from the right upper lobe posterior segment.  There is no evidence of malignancy there.  However this is the same location of bleeding as 2009.  I suspect there is either a focal area of bronchiectasis not seen well on his CT or an enlarged blood vessel that bleeds when he is acutely infected.  We talked about conservative management (antibiotics alone) vs embolization of the RUL.  I explained the relative risk of recurrent hemoptysis again is high (> 50%) given two similar incidents.  He would like to proceed with embolization if IR feels appropriate. Will consult IR now.  Nocturnal hypoxemia: needs outpatient  sleep study  Will need f/u with Dr. Halford Chessman as outpatient  > 50% of today's 40 minute visit spent face to face with he and his wife  Roselie Awkward, MD [place] [facility] Pager: [phone] Cell: [phone] After 3pm or if no response, call [phone]

## 2016-08-27 NOTE — Progress Notes (Signed)
Pt had a good night. He seemed to sleep well after Ambien. He did have 3 episodes of bleeding red blood that lasted less than 15 mins at a time. He is somewhat anxious/concerned at times and Ativan was given. He only wants to take 0.5 mg at a time. He does sleep with mouth open and has periods of sleep apnea. His sats dropped while sleeping to the point we had to put high flow O2 on. While awake he rarely drops his sats.

## 2016-08-27 NOTE — Consult Note (Signed)
Chief Complaint: Patient was seen in consultation today for bronchial arteriogram with possible embolization Chief Complaint  Patient presents with  . Hematemesis  .  Referring Physician(s): McQuaid, D.  Supervising Physician: Markus Daft  Patient Status: [facility] - In-pt  History of Present Illness: Charles Bowman is a 55 y.o. male former smoker with PMH significant for essential thrombocytosis (on Agrylin + ASA 66m, and Goody Powders, held since 10/20), previous episode of hemoptysis requiring a bronchoscopy in 2009.  Patient presented one week ago for reoccurring hemoptysis.  Evaluated on 10/23 by ENT which showed no evidence of bleeding, bronchoscopy preformed on 10/24, revealing a blood clot in the posterior segment of the RUL, completely obstructing the airway.  Clot removed successfully, followed by the lesion bleeding.  BAL performed in RUL posterior segment, sent for cultures; after bronchial washings, no underlying lesion/mass appreciated.  Consultation was requested for possible bronchial artery embolization.  Patient admits to non-productive cough with intermittent hemoptysis, none seen today; occasionally central chest pain following coughing.  Denies SOB, dyspnea, abdominal discomfort, acute chest pain, and fever/chills.  Past Medical History:  Diagnosis Date  . Anemia   . Anxiety 05/11/2014  . Cancer [place]   . [place]   . Hypertension   . Murmur 11/09/2014  . Thyroid disease     Past Surgical History:  Procedure Laterality Date  . BONE MARROW BIOPSY    . BRONCHOSCOPY    . VIDEO BRONCHOSCOPY Bilateral 08/26/2016   Procedure: VIDEO BRONCHOSCOPY WITHOUT FLUORO;  Surgeon: DJuanito Doom MD;  Location: [facility];  Service: Cardiopulmonary;  Laterality: Bilateral;    Allergies: Review of patient's allergies indicates no known allergies.  Medications: Prior to Admission medications   Medication Sig Start Date End Date Taking?  Authorizing Provider  anagrelide (AGRYLIN) 1 MG capsule TAKE 3 CAPSULES BY MOUTH DAILY. Patient taking differently: TAKE 2 IN THE  MORNING AND 1 IN THE EVENING 08/11/16  Yes NHeath Lark MD  aspirin 81 MG tablet Take 81 mg by mouth daily.   Yes Historical Provider, MD  atorvastatin (LIPITOR) 20 MG tablet Take 1 tablet (20 mg total) by mouth daily. NO MORE REFILLS WITHOUT OFFICE VISIT/ LABS - 2ND NOTICE 08/31/15  Yes JRoselee Culver MD  fluticasone ([facility] 50 MCG/ACT nasal spray PLACE 2 SPRAYS IN EACH NOSTRIL EVERY DAY 09/18/15  Yes MJaynee Eagles PA-C  glimepiride (AMARYL) 4 MG tablet TAKE 1 TABLET (4 MG TOTAL) BY MOUTH DAILY BEFORE BREAKFAST.  "NO MORE REFILLS WITHOUT OV" Patient taking differently: 4 mg. TAKE 1 TABLET (4 MG TOTAL) BY MOUTH DAILY BEFORE BREAKFAST.  "NO MORE REFILLS WITHOUT OV" 06/03/15  Yes NEzekiel Slocumb PA-C  hydrochlorothiazide (MICROZIDE) 12.5 MG capsule TAKE ONE CAPSULE BY MOUTH EVERY DAY 02/14/16  Yes MJaynee Eagles PA-C  lisinopril (PRINIVIL,ZESTRIL) 20 MG tablet Take 20 mg by mouth daily.  05/12/16  Yes Historical Provider, MD  LORazepam (ATIVAN) 1 MG tablet TAKE 1 TABLET BY MOUTH 3 TIMES A DAY AS NEEDED FOR ANXIETY 01/25/15  Yes JRoselee Culver MD  MATZIM LA 420 MG 24 hr tablet TAKE 1 TABLET (420 MG TOTAL) BY MOUTH DAILY. 03/18/16  Yes MJaynee Eagles PA-C  metFORMIN (GLUCOPHAGE-XR) 500 MG 24 hr tablet TAKE 4 TABLETS BY MOUTH EVERY DAY  "OFFICE VISIT NEEDED" Patient taking differently: Take 1,000 mg by mouth 2 (two) times daily.  09/16/15  Yes JRoselee Culver MD  Multiple Vitamins-Minerals (MULTIVITAMIN WITH MINERALS) tablet Take 1 tablet  by mouth daily.   Yes Historical Provider, MD  SYNTHROID 175 MCG tablet TAKE 1 TABLET (175 MCG TOTAL) BY MOUTH DAILY  "NO MORE REFILLS WITHOUT OFFICE VISIT" 10/16/15  Yes Roselee Culver, MD  glucose blood (ONE TOUCH ULTRA TEST) test strip Use as instructed 11/15/13   Barton Fanny, MD  Lancets [facility] ULTRASOFT) lancets Use as  instructed 11/15/13   Barton Fanny, MD     Family History  Problem Relation Age of Onset  . Cancer Father     prostate ca  . Dementia Father 43    2008  . COPD Mother     +Tobacco Use    Social History   Social History  . Marital status: Married    Spouse name: Angie  . Number of children: 3  . Years of education: 11th/GED   Occupational History  . CMM Supervisor     Psychologist, educational, Automotive engineer   Social History Main Topics  . Smoking status: Former Smoker    Packs/day: 1.50    Years: 20.00    Types: Cigarettes    Quit date: 09/02/2008  . Smokeless tobacco: Never Used  . Alcohol use No  . Drug use: No  . Sexual activity: Yes   Other Topics Concern  . None   Social History Narrative   Lives with his wife and daughter and 2 step-sons. His other daughters live in [place] and [place].     Review of Systems - see above  Vital Signs: BP (!) 160/73 Comment: up to bathroom to have stool  Pulse 91   Temp 97.8 F (36.6 C) (Axillary)   Resp 11   Ht _0  (1.702 m)   Wt 192 lb 10.9 oz (87.4 kg)   SpO2 96%   BMI 30.18 kg/m   Physical Exam 55 yo WDWN male, resting comfortably in bed, wife at bedside, in no acute distress, AAOx3 Resp: clear to auscultation bilaterally, normal respiratory effort, no wheezes, rales, or rhonchi CV: RRR, no murmur, rubs or gallops appreciated Abd: non-distended, non-tender, +BS in all 4 quadrants  Mallampati Score:  MD Evaluation Airway: WNL Heart: WNL Abdomen: WNL Chest/ Lungs: WNL ASA  Classification: 2  Imaging: Dg Chest 2 View  Result Date: 08/20/2016 CLINICAL DATA:  Hemoptysis. EXAM: CHEST  2 VIEW COMPARISON:  Radiographs of January 23, 2016. FINDINGS: The heart size and mediastinal contours are within normal limits. Both lungs are clear. No pneumothorax or pleural effusion is noted. The visualized skeletal structures are unremarkable. IMPRESSION: No active cardiopulmonary disease. Electronically Signed   By:  Marijo Conception, M.D.   On: 08/20/2016 10:29   Ct Chest W Contrast  Result Date: 08/20/2016 CLINICAL DATA:  Hemoptysis and history of thrombocytopenia EXAM: CT CHEST WITH CONTRAST TECHNIQUE: Multidetector CT imaging of the chest was performed during intravenous contrast administration. CONTRAST:  28m ISOVUE-300 IOPAMIDOL (ISOVUE-300) INJECTION 61% COMPARISON:  Same day chest radiographs FINDINGS: Cardiovascular: No significant vascular findings. Mild atherosclerosis of the aorta. Normal heart size. No pericardial effusion. Mediastinum/Nodes: No axillary, supraclavicular, mediastinal nor hilar lymphadenopathy. Small hiatal hernia. Lungs/Pleura: No evidence of bronchiectasis. A few small right apical pulmonary blebs are noted. Centrilobular emphysema, upper lobe predominant. No pneumonic consolidation, effusion or CHF. Upper Abdomen: No acute abnormality. Musculoskeletal: No chest wall abnormality. No acute or significant osseous findings. IMPRESSION: Upper lobe predominant centrilobular emphysema. No acute cardiopulmonary disease. Aortic atherosclerosis. Electronically Signed   By: DAshley RoyaltyM.D.   On: 08/20/2016 17:43   Ct  Angio Chest Pe W Or Wo Contrast  Result Date: 08/22/2016 CLINICAL DATA:  Hemoptysis.  Thrombocytosis. EXAM: CT ANGIOGRAPHY CHEST WITH CONTRAST TECHNIQUE: Multidetector CT imaging of the chest was performed using the standard protocol during bolus administration of intravenous contrast. Multiplanar CT image reconstructions and MIPs were obtained to evaluate the vascular anatomy. CONTRAST:  100 cc Isovue 370 COMPARISON:  CT chest from 2 days ago, 08/20/2016 FINDINGS: Cardiovascular: No filling defect is identified in the pulmonary arterial tree to suggest pulmonary embolus. Mild atherosclerotic calcification of the aortic arch. Mediastinum/Nodes: No obvious esophageal abnormality. No adenopathy. Lungs/Pleura: Centrilobular emphysema. Mosaic attenuation in the lower lobes and right  middle lobe and to a lesser extent posteriorly in the right upper lobe, subtle sub solid nodularity in a centrilobular distribution. Upper Abdomen: Unremarkable Musculoskeletal: Unremarkable Review of the MIP images confirms the above findings. IMPRESSION: 1. No filling defect is identified in the pulmonary arterial tree to suggest pulmonary embolus. 2. Very faint centrilobular sub solid nodularity causing a mosaic attenuation appearance primarily in the right middle lobe and in the lower lobes. Differential diagnostic considerations favor hypersensitivity pneumonitis, respiratory bronchiolitis, or atypical infectious process. 3. Centrilobular emphysema. Electronically Signed   By: Van Clines M.D.   On: 08/22/2016 18:13   Dg Chest Port 1 View  Result Date: 08/24/2016 CLINICAL DATA:  55 year old male with hemoptysis. EXAM: PORTABLE CHEST 1 VIEW COMPARISON:  Chest CT dated 08/22/2016 FINDINGS: A faint area of increased density noted at the right lung base which may represent normal pulmonary vasculature or correspond to the hazy and ground-glass density noted on the CT. The left lung is clear. There is no pleural effusion or pneumothorax. The cardiac silhouette is within normal limits with no acute osseous pathology. IMPRESSION: Normal pulmonary vasculature versus airspace haziness at the right lung base. No focal consolidation. Electronically Signed   By: Anner Crete M.D.   On: 08/24/2016 05:59   Dg Chest Port 1 View  Result Date: 08/22/2016 CLINICAL DATA:  Hemoptysis. EXAM: PORTABLE CHEST 1 VIEW COMPARISON:  Radiographs and CT scan of August 20, 2016. FINDINGS: The heart size and mediastinal contours are within normal limits. Both lungs are clear. No pneumothorax or pleural effusion is noted. The visualized skeletal structures are unremarkable. IMPRESSION: No acute cardiopulmonary abnormality seen. Electronically Signed   By: Marijo Conception, M.D.   On: 08/22/2016 15:52     Labs:  CBC:  Recent Labs  08/24/16 0358 08/25/16 0311 08/26/16 0316 08/27/16 0330  WBC 8.2 6.3 5.4 5.5  HGB 13.1 12.2* 11.3* 11.0*  HCT 40.4 38.3* 35.1* 34.3*  PLT 263 266 272 336    COAGS:  Recent Labs  08/22/16 0954  INR 0.94    BMP:  Recent Labs  08/22/16 0950 08/23/16 0326 08/24/16 0358 08/25/16 0311  NA 134* 137 136 138  K 4.3 4.3 4.4 4.2  CL 98* 103 99* 101  CO2 _0 GLUCOSE 270* 141* 197* 154*  BUN 23* 19 20 22*  CALCIUM 9.6 8.8* 9.7 9.3  CREATININE 1.12 0.93 0.94 1.10  GFRNONAA >60 >60 >60 >60  GFRAA >60 >60 >60 >60    LIVER FUNCTION TESTS:  Recent Labs  08/20/16 1605 08/22/16 0950  BILITOT 0.7 0.8  AST 27 29  ALT 38 39  ALKPHOS 92 97  PROT 7.4 7.9  ALBUMIN 4.3 4.5    TUMOR MARKERS: No results for input(s): AFPTM, CEA, CA199, CHROMGRNA in the last 8760 hours.  Assessment and Plan:  Charles Bowman is a 55 y.o. male with PMH significant for essential thrombocytosis (on Agrylin + ASA 46m, and Goody Powders, held since 10/20), previous episode of hemoptysis requiring a bronchoscopy in 2009.  Patient presented one week ago for reoccurring hemoptysis.  Evaluated on 10/23 by ENT which showed no evidence of bleeding, bronchoscopy preformed on 10/24, revealing a blood clot in the posterior segment of the RUL, completely obstructing the airway.  Clot removed successfully, followed by the lesion bleeding.  BAL performed in RUL posterior segment, sent for cultures; after bronchial washings, no underlying lesion/mass appreciated.  Consultation was requested for possible bronchial artery embolization.All imaging studies have been reviewed Dr. HLoma Messingand at this time since patient is not actively bleeding he recommends holding on bronchial arteriography. Details/risks of above procedure, including but not limited to, internal bleeding, infection,  paralysis, contrast nephropathy, inability to locate or embolize suspected vessel discussed  with patient and wife with their understanding and consent. We will continue to monitor patient's status and if hemoptysis continues then can reevaluate for possible arteriography with embolization.    Thank you for this interesting consult.  I greatly enjoyed meeting DCORDARYL DECELLESand look forward to participating in their care.  A copy of this report was sent to the requesting provider on this date.  Electronically Signed: D. KRowe Robert10/25/2017, 2:16 PM   I spent a total of 30 minutes  in face to face in clinical consultation, greater than 50% of which was counseling/coordinating care for bronchial arteriogram with possible embolization

## 2016-08-28 ENCOUNTER — Encounter (HOSPITAL_COMMUNITY): Payer: Self-pay | Admitting: Interventional Radiology

## 2016-08-28 ENCOUNTER — Inpatient Hospital Stay (HOSPITAL_COMMUNITY): Payer: 59

## 2016-08-28 HISTORY — PX: IR GENERIC HISTORICAL: IMG1180011

## 2016-08-28 LAB — BASIC METABOLIC PANEL
ANION GAP: 7 (ref 5–15)
BUN: 26 mg/dL — ABNORMAL HIGH (ref 6–20)
CHLORIDE: 103 mmol/L (ref 101–111)
CO2: 28 mmol/L (ref 22–32)
CREATININE: 1.15 mg/dL (ref 0.61–1.24)
Calcium: 8.9 mg/dL (ref 8.9–10.3)
GFR calc non Af Amer: >60 mL/min (ref >60)
Glucose, Bld: 165 mg/dL — ABNORMAL HIGH (ref 65–99)
POTASSIUM: 4.6 mmol/L (ref 3.5–5.1)
SODIUM: 138 mmol/L (ref 135–145)

## 2016-08-28 LAB — GLUCOSE, CAPILLARY
GLUCOSE-CAPILLARY: 179 mg/dL — AB (ref 65–99)
GLUCOSE-CAPILLARY: 154 mg/dL — AB (ref 65–99)
GLUCOSE-CAPILLARY: 282 mg/dL — AB (ref 65–99)
Glucose-Capillary: 107 mg/dL — ABNORMAL HIGH (ref 65–99)

## 2016-08-28 LAB — CBC WITH DIFFERENTIAL/PLATELET
Basophils Absolute: 0 10*3/uL (ref 0.0–0.1)
Basophils Relative: 0 %
EOS ABS: 0.1 10*3/uL (ref 0.0–0.7)
Eosinophils Relative: 2 %
HEMATOCRIT: 32.5 % — AB (ref 39.0–52.0)
HEMOGLOBIN: 10.5 g/dL — AB (ref 13.0–17.0)
LYMPHS ABS: 1.2 10*3/uL (ref 0.7–4.0)
Lymphocytes Relative: 24 %
MCH: 27.3 pg (ref 26.0–34.0)
MCHC: 32.3 g/dL (ref 30.0–36.0)
MCV: 84.4 fL (ref 78.0–100.0)
Monocytes Absolute: 0.4 10*3/uL (ref 0.1–1.0)
Monocytes Relative: 8 %
NEUTROS ABS: 3.3 10*3/uL (ref 1.7–7.7)
NEUTROS PCT: 66 %
Platelets: 372 10*3/uL (ref 150–400)
RBC: 3.85 MIL/uL — AB (ref 4.22–5.81)
RDW: 16.1 % — ABNORMAL HIGH (ref 11.5–15.5)
WBC: 5 10*3/uL (ref 4.0–10.5)

## 2016-08-28 LAB — CULTURE, RESPIRATORY
CULTURE: NO GROWTH
SPECIAL REQUESTS: NORMAL

## 2016-08-28 LAB — CULTURE, RESPIRATORY W GRAM STAIN

## 2016-08-28 MED ORDER — FENTANYL CITRATE (PF) 100 MCG/2ML IJ SOLN
INTRAMUSCULAR | Status: AC | PRN
  Administered 2016-08-28: 50 ug via INTRAVENOUS
  Administered 2016-08-28: 25 ug via INTRAVENOUS
  Administered 2016-08-28: 50 ug via INTRAVENOUS

## 2016-08-28 MED ORDER — MIDAZOLAM HCL 2 MG/2ML IJ SOLN
INTRAMUSCULAR | Status: AC | PRN
Start: 2016-08-28 — End: 2016-08-28
  Administered 2016-08-28: 0.5 mg via INTRAVENOUS
  Administered 2016-08-28 (×2): 1 mg via INTRAVENOUS
  Administered 2016-08-28: 0.5 mg via INTRAVENOUS

## 2016-08-28 MED ORDER — LIDOCAINE HCL 1 % IJ SOLN
INTRAMUSCULAR | Status: AC
  Filled 2016-08-28: qty 20

## 2016-08-28 MED ORDER — IOPAMIDOL (ISOVUE-300) INJECTION 61%
100.0000 mL | Freq: Once | INTRAVENOUS | Status: AC | PRN
Start: 2016-08-28 — End: 2016-08-28
  Administered 2016-08-28: 45 mL via INTRA_ARTERIAL

## 2016-08-28 MED ORDER — MIDAZOLAM HCL 2 MG/2ML IJ SOLN
INTRAMUSCULAR | Status: AC
  Filled 2016-08-28: qty 2

## 2016-08-28 MED ORDER — LIDOCAINE HCL 1 % IJ SOLN
INTRAMUSCULAR | Status: AC | PRN
  Administered 2016-08-28: 5 mL

## 2016-08-28 MED ORDER — IOPAMIDOL (ISOVUE-300) INJECTION 61%
200.0000 mL | Freq: Once | INTRAVENOUS | Status: AC | PRN
  Administered 2016-08-28: 20 mL via INTRAVENOUS

## 2016-08-28 MED ORDER — FENTANYL CITRATE (PF) 100 MCG/2ML IJ SOLN
INTRAMUSCULAR | Status: AC
  Filled 2016-08-28: qty 2

## 2016-08-28 NOTE — Sedation Documentation (Signed)
Patient is resting comfortably. 

## 2016-08-28 NOTE — Progress Notes (Signed)
Pharmacy Antibiotic Note  Charles Bowman is a 55 y.o. 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 CTa chest reveals likely infiltrate.  Pharmacy was consulted for vancomycin and Zosyn dosing-->narrowed to Zosyn only on 10/23 which continues per PCCM.    Today, 08/28/2016  Day #6 antibiotics  SCr slightly increased to 1.15 with CrCl ~ 80 ml/min  WBC WNL  Afebrile  Plan:  Continue Zosyn 3.375gm IV Q8h to be infused over 4hrs  No further dose adjustments anticipated.  Pharmacy to sign off.   Length of therapy per MD pending cx data  Height: 5\' 7"  (170.2 cm) Weight: 192 lb 10.9 oz (87.4 kg) IBW/kg (Calculated) : 66.1  Temp (24hrs), Avg:97.7 F (36.5 C), Min:97.6 F (36.4 C), Max:97.8 F (36.6 C)   Recent Labs Lab 08/22/16 0950 08/22/16 1017 08/22/16 1401 08/23/16 0326 08/24/16 0358 08/25/16 0311 08/25/16 0628 08/26/16 0316 08/27/16 0330 08/28/16 0341  WBC 5.9  --   --  4.6 8.2 6.3  --  5.4 5.5 5.0  CREATININE 1.12  --   --  0.93 0.94 1.10  --   --   --  1.15  LATICACIDVEN  --  2.78* 1.7  --   --   --   --   --   --   --   VANCOTROUGH  --   --   --   --   --   --  20  --   --   --     Estimated Creatinine Clearance: 79.3 mL/min (by C-G formula based on SCr of 1.15 mg/dL).    No Known Allergies  Antimicrobials this admission: Vancomycin 10/20 >> 10/23 Zosyn 10/20 >>    Dose adjustments this admission: 10/23 0530 VT =  20 mcg/ml on vanco 1gm q8h (prior to 7th dose)   Microbiology results: 10/20MRSA PCR: neg 10/24 BAL: NGTD 10/24 AFB: neg 10/24 acid fast cx with reflexed sensitivies from BAL: sent 10/24 fungus cx from BAL: sent  Thank you for allowing pharmacy to be a part of this patient's care.  Netta Cedars, PharmD, BCPS Pager: [phone] 08/28/2016 10:09 AM

## 2016-08-28 NOTE — Progress Notes (Signed)
PROGRESS NOTE                                                                                                                                                                                                             Patient Demographics:    Charles Bowman, is a 55 y.o. male, DOB - 12-18-1960, OHY:[id number]  Admit date - 08/22/2016   Admitting Physician [name], MD  Outpatient Primary MD for the patient is [name], DO  LOS - 5  Outpatient Specialists:None  Chief Complaint  Patient presents with  . 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   Subjective:   3 episodes of hemoptysis last night, hemoglobin stable around 10.  No more episodes this am. Off oxygen.    Assessment  & Plan :    Principal Problem:   Hemoptysis CT angiogram of the chest concerning for pneumonia. PC CM following. Seen by ENT and no signs of upper airway bleeding.  bronchoscopy done revealed     " A blot clot was found in the posterior segment of the right upper lobe (B2). It was completely obstructing the airway.The clot was successfully removed, but the lesion began bleeding. BAL       was performed in the RUL posterior segment (B2) of the lung and sent for bacterial, AFB and fungal analysis." ASA and anagrelide discontinued. Discussed with Dr [name].  H&H stable.    Three more episodes of hemoptysis since last night, IR consulted for embolization.  Underwent post bronchial artery embolization.      Active Problems: ? lobar pneumonia Afebrile. Doesn't appear toxic, white count normal. . Would resume zosyn, as Bronchoscopy shows an area of bronchiectasis, though not very clear.       Essential thrombocythemia ([place]) Hold ASA and acrylin. Platelets stable. Follows with Dr  [name]. Discussed with her regarding holding agrylin and ASA.     Hypothyroidism Continue Synthroid.  OSA Continue CPAP     Essential hypertension Better controlled today.  2-D echo shows EF of 45-50% with diffuse hypokinesis. Dr [name] Labs discussed with cardiology who recommended this likely from poorly controlled hypertension and patient should follow-up with his primary cardiologist Dr. [name] as outpatient.  Hyponatremia Mild, secondary to  dehydration. Resolved with fluids     Diabetes mellitus type 2, uncomplicated (HCC) Continue sliding scale coverage. Holding metformin and Amaryl. CBG (last 3)   Recent Labs  08/27/16 2206 08/28/16 0735 08/28/16 1351  GLUCAP 168* 179* 154*   Resume SSI,    Anxiety  low dose ativan prn      Code Status : Full code  Family Communication  : None at bedside  Disposition Plan  : possible transfer to telemetry today.   Barriers For Discharge : possible d/c in am.   Consults  :   PC CM ENT  Procedures  : CT angiogram of the chest 2-D echo Bronchoscopy with bx  DVT Prophylaxis  :  SCDs  Lab Results  Component Value Date   PLT 372 08/28/2016    Antibiotics  :    Anti-infectives    Start     Dose/Rate Route Frequency Ordered Stop   08/23/16 0600  vancomycin (VANCOCIN) IVPB 1000 mg/200 mL premix  Status:  Discontinued     1,000 mg 200 mL/hr over 60 Minutes Intravenous Every 8 hours 08/22/16 1942 08/25/16 0835   08/23/16 0200  piperacillin-tazobactam (ZOSYN) IVPB 3.375 g     3.375 g 12.5 mL/hr over 240 Minutes Intravenous Every 8 hours 08/22/16 1942     08/22/16 2000  vancomycin (VANCOCIN) 1,500 mg in sodium chloride 0.9 % 500 mL IVPB     1,500 mg 250 mL/hr over 120 Minutes Intravenous  Once 08/22/16 1942 08/22/16 2307   08/22/16 2000  piperacillin-tazobactam (ZOSYN) IVPB 3.375 g     3.375 g 100 mL/hr over 30 Minutes Intravenous  Once 08/22/16 1942 08/22/16 2059        Objective:   Vitals:   08/28/16 1400  08/28/16 1500 08/28/16 1700 08/28/16 1756  BP: 130/70 (!) 105/54 130/69   Pulse: 65 (!) 52 (!) 56   Resp: '17 12 14   ' Temp:    98.2 F (36.8 C)  TempSrc:      SpO2: 92% 94% 93%   Weight:      Height:        Wt Readings from Last 3 Encounters:  08/22/16 87.4 kg (192 lb 10.9 oz)  08/20/16 89.1 kg (196 lb 6 oz)  05/23/16 89 kg (196 lb 1.6 oz)     Intake/Output Summary (Last 24 hours) at 08/28/16 1828 Last data filed at 08/28/16 1700  Gross per 24 hour  Intake              570 ml  Output             1150 ml  Net             -580 ml     Physical Exam  Gen: alert afebrile comfortable.  HEENT: no pallor, moist mucosa, supple neck Chest: clear b/l, no wheezing or rhonchi.  CVS: N S1&S2, no murmurs,  GI: soft, NT, ND, BS+ Musculoskeletal: warm, no edema     Data Review:    CBC  Recent Labs Lab 08/24/16 0358 08/25/16 0311 08/26/16 0316 08/27/16 0330 08/28/16 0341  WBC 8.2 6.3 5.4 5.5 5.0  HGB 13.1 12.2* 11.3* 11.0* 10.5*  HCT 40.4 38.3* 35.1* 34.3* 32.5*  PLT 263 266 272 336 372  MCV 82.8 83.8 83.8 84.1 84.4  MCH 26.8 26.7 27.0 27.0 27.3  MCHC 32.4 31.9 32.2 32.1 32.3  RDW 16.1* 16.4* 16.3* 16.3* 16.1*  LYMPHSABS  --   --   --   --  1.2  MONOABS  --   --   --   --  0.4  EOSABS  --   --   --   --  0.1  BASOSABS  --   --   --   --  0.0    Chemistries   Recent Labs Lab 08/22/16 0950 08/23/16 0326 08/24/16 0358 08/25/16 0311 08/28/16 0341  NA 134* 137 136 138 138  K 4.3 4.3 4.4 4.2 4.6  CL 98* 103 99* 101 103  CO2 '26 27 28 30 28  ' GLUCOSE 270* 141* 197* 154* 165*  BUN 23* 19 20 22* 26*  CREATININE 1.12 0.93 0.94 1.10 1.15  CALCIUM 9.6 8.8* 9.7 9.3 8.9  AST 29  --   --   --   --   ALT 39  --   --   --   --   ALKPHOS 97  --   --   --   --   BILITOT 0.8  --   --   --   --    ------------------------------------------------------------------------------------------------------------------ No results for input(s): CHOL, HDL, LDLCALC, TRIG, CHOLHDL,  LDLDIRECT in the last 72 hours.  Lab Results  Component Value Date   HGBA1C 7.3 01/25/2015   ------------------------------------------------------------------------------------------------------------------ No results for input(s): TSH, T4TOTAL, T3FREE, THYROIDAB in the last 72 hours.  Invalid input(s): FREET3 ------------------------------------------------------------------------------------------------------------------ No results for input(s): VITAMINB12, FOLATE, FERRITIN, TIBC, IRON, RETICCTPCT in the last 72 hours.  Coagulation profile  Recent Labs Lab 08/22/16 0954  INR 0.94    No results for input(s): DDIMER in the last 72 hours.  Cardiac Enzymes No results for input(s): CKMB, TROPONINI, MYOGLOBIN in the last 168 hours.  Invalid input(s): CK ------------------------------------------------------------------------------------------------------------------    Component Value Date/Time   BNP 34.6 08/20/2016 1605    Inpatient Medications  Scheduled Meds: . atorvastatin  20 mg Oral Daily  . diltiazem  420 mg Oral Daily  . hydrochlorothiazide  12.5 mg Oral Daily  . insulin aspart  0-15 Units Subcutaneous TID WC  . insulin aspart  0-5 Units Subcutaneous QHS  . levothyroxine  175 mcg Oral QHS  . lisinopril  40 mg Oral Daily  . piperacillin-tazobactam (ZOSYN)  IV  3.375 g Intravenous Q8H  . sodium chloride  1 spray Each Nare BID   Continuous Infusions:  PRN Meds:.acetaminophen **OR** acetaminophen, hydrALAZINE, HYDROcodone-acetaminophen, LORazepam, ondansetron **OR** ondansetron (ZOFRAN) IV, zolpidem  Micro Results Recent Results (from the past 240 hour(s))  MRSA PCR Screening     Status: None   Collection Time: 08/22/16  6:50 PM  Result Value Ref Range Status   MRSA by PCR NEGATIVE NEGATIVE Final    Comment:        The GeneXpert MRSA Assay (FDA approved for NASAL specimens only), is one component of a comprehensive MRSA colonization surveillance program.  It is not intended to diagnose MRSA infection nor to guide or monitor treatment for MRSA infections.   Culture, respiratory (NON-Expectorated)     Status: None   Collection Time: 08/26/16  8:58 AM  Result Value Ref Range Status   Specimen Description BRONCHIAL ALVEOLAR LAVAGE  Final   Special Requests Normal  Final   Gram Stain   Final    FEW WBC PRESENT,BOTH PMN AND MONONUCLEAR NO ORGANISMS SEEN    Culture   Final    NO GROWTH 2 DAYS Performed at [facility]    Report Status 08/28/2016 FINAL  Final  Acid Fast Smear (AFB)     Status: None  Collection Time: 08/26/16  8:58 AM  Result Value Ref Range Status   AFB Specimen Processing Concentration  Final   Acid Fast Smear Negative  Final    Comment: (NOTE) Performed At: [facility] [address] [place], [place] [id number] Lindon Romp MD QQ:[id number]    Source (AFB) BRONCHIAL ALVEOLAR LAVAGE  Final    Radiology Reports Dg Chest 2 View  Result Date: 08/20/2016 CLINICAL DATA:  Hemoptysis. EXAM: CHEST  2 VIEW COMPARISON:  Radiographs of January 23, 2016. FINDINGS: The heart size and mediastinal contours are within normal limits. Both lungs are clear. No pneumothorax or pleural effusion is noted. The visualized skeletal structures are unremarkable. IMPRESSION: No active cardiopulmonary disease. Electronically Signed   By: Marijo Conception, M.D.   On: 08/20/2016 10:29   Ct Chest W Contrast  Result Date: 08/20/2016 CLINICAL DATA:  Hemoptysis and history of thrombocytopenia EXAM: CT CHEST WITH CONTRAST TECHNIQUE: Multidetector CT imaging of the chest was performed during intravenous contrast administration. CONTRAST:  61m ISOVUE-300 IOPAMIDOL (ISOVUE-300) INJECTION 61% COMPARISON:  Same day chest radiographs FINDINGS: Cardiovascular: No significant vascular findings. Mild atherosclerosis of the aorta. Normal heart size. No pericardial effusion. Mediastinum/Nodes: No axillary, supraclavicular, mediastinal nor  hilar lymphadenopathy. Small hiatal hernia. Lungs/Pleura: No evidence of bronchiectasis. A few small right apical pulmonary blebs are noted. Centrilobular emphysema, upper lobe predominant. No pneumonic consolidation, effusion or CHF. Upper Abdomen: No acute abnormality. Musculoskeletal: No chest wall abnormality. No acute or significant osseous findings. IMPRESSION: Upper lobe predominant centrilobular emphysema. No acute cardiopulmonary disease. Aortic atherosclerosis. Electronically Signed   By: DAshley RoyaltyM.D.   On: 08/20/2016 17:43   Ct Angio Chest Pe W Or Wo Contrast  Result Date: 08/22/2016 CLINICAL DATA:  Hemoptysis.  Thrombocytosis. EXAM: CT ANGIOGRAPHY CHEST WITH CONTRAST TECHNIQUE: Multidetector CT imaging of the chest was performed using the standard protocol during bolus administration of intravenous contrast. Multiplanar CT image reconstructions and MIPs were obtained to evaluate the vascular anatomy. CONTRAST:  100 cc Isovue 370 COMPARISON:  CT chest from 2 days ago, 08/20/2016 FINDINGS: Cardiovascular: No filling defect is identified in the pulmonary arterial tree to suggest pulmonary embolus. Mild atherosclerotic calcification of the aortic arch. Mediastinum/Nodes: No obvious esophageal abnormality. No adenopathy. Lungs/Pleura: Centrilobular emphysema. Mosaic attenuation in the lower lobes and right middle lobe and to a lesser extent posteriorly in the right upper lobe, subtle sub solid nodularity in a centrilobular distribution. Upper Abdomen: Unremarkable Musculoskeletal: Unremarkable Review of the MIP images confirms the above findings. IMPRESSION: 1. No filling defect is identified in the pulmonary arterial tree to suggest pulmonary embolus. 2. Very faint centrilobular sub solid nodularity causing a mosaic attenuation appearance primarily in the right middle lobe and in the lower lobes. Differential diagnostic considerations favor hypersensitivity pneumonitis, respiratory bronchiolitis, or  atypical infectious process. 3. Centrilobular emphysema. Electronically Signed   By: WVan ClinesM.D.   On: 08/22/2016 18:13   Ir Angiogram Visceral Selective  Result Date: 08/28/2016 INDICATION: Recurrent hemoptysis.  Please perform bronchial artery embolization. EXAM: 1. ULTRASOUND GUIDANCE FOR ARTERIAL ACCESS. 2. FLUSH THORACIC AORTOGRAM 3. SELECTIVE RIGHT BRONCHIAL ARTERIOGRAM AND PERCUTANEOUS PARTICLE EMBOLIZATION MEDICATIONS: None ANESTHESIA/SEDATION: Moderate (conscious) sedation was employed during this procedure. A total of Versed 3 mg and Fentanyl 125 mcg was administered intravenously. Moderate Sedation Time: 45 minutes. The patient's level of consciousness and vital signs were monitored continuously by radiology nursing throughout the procedure under my direct supervision. CONTRAST:  65 cc Isovue-300 FLUOROSCOPY TIME:  Fluoroscopy Time: 12  minutes 54 seconds (606 mGy). COMPLICATIONS: None immediate. PROCEDURE: Informed consent was obtained from the patient following explanation of the procedure, risks, benefits and alternatives. The patient understands, agrees and consents for the procedure. All questions were addressed. A time out was performed prior to the initiation of the procedure. Maximal barrier sterile technique utilized including caps, mask, sterile gowns, sterile gloves, large sterile drape, hand hygiene, and Betadine prep. The right femoral head was marked fluoroscopically. Under ultrasound guidance, the right common femoral artery was accessed with a micropuncture kit after the overlying soft tissues were anesthetized with 1% lidocaine. An ultrasound image was saved for documentation purposes. The micropuncture sheath was exchanged for a 5 [place] vascular sheath over a Bentson wire. A closure arteriogram was performed through the side of the sheath confirming access within the right common femoral artery. Over a Bentson wire, a flush pigtail catheter was advanced to the level of  the thoracic aorta and a flush thoracic aortogram was performed. Over a Bentson wire, the pigtail catheter was exchanged for a Mickelson catheter was advanced to the level of the thoracic aorta where it was back bled and flushed. The catheter was then utilized to select the bronchial artery and a selective right bronchial arteriogram was performed. With the use of a Fathom 14 micro wire, a high-flow Renegade micro catheter was advanced to the distal aspect of the right bronchial artery. Selective right bronchial arteriogram was performed and the vessel was embolized with a half vial of 300-500 micron Embospheres. The micro catheter was retracted into the central aspect of the bronchial artery and post embolization selective bronchi arteriogram was performed. Images reviewed in the procedure was terminated. All wires, catheters and sheaths were removed from the patient. Hemostasis was achieved at the right groin access site with manual compression. A dressing was placed. The patient tolerated the procedure well without immediate postprocedural complication. FINDINGS: Flush aortogram demonstrates the origin of the right bronchial artery. Selective right bronchial arteriogram demonstrates patency of the vessel with potential mild vessel irregularity involving several right upper lobe bronchial artery is compatible with the area of bleeding seen on recent bronchoscopy. No definitive non pulmonary arterial supply was identified. Completion right bronchial arteriogram demonstrates patency of the main trunk of the bronchial artery with successful pruning of the distal vascular tree. IMPRESSION: Technically successful embolization of the right bronchial artery for recurrent intermittent hemoptysis. Electronically Signed   By: Sandi Mariscal M.D.   On: 08/28/2016 15:26   Ir US Guide Vasc Access Right  Result Date: 08/28/2016 INDICATION: Recurrent hemoptysis.  Please perform bronchial artery embolization. EXAM: 1.  ULTRASOUND GUIDANCE FOR ARTERIAL ACCESS. 2. FLUSH THORACIC AORTOGRAM 3. SELECTIVE RIGHT BRONCHIAL ARTERIOGRAM AND PERCUTANEOUS PARTICLE EMBOLIZATION MEDICATIONS: None ANESTHESIA/SEDATION: Moderate (conscious) sedation was employed during this procedure. A total of Versed 3 mg and Fentanyl 125 mcg was administered intravenously. Moderate Sedation Time: 45 minutes. The patient's level of consciousness and vital signs were monitored continuously by radiology nursing throughout the procedure under my direct supervision. CONTRAST:  65 cc Isovue-300 FLUOROSCOPY TIME:  Fluoroscopy Time: 12 minutes 54 seconds (606 mGy). COMPLICATIONS: None immediate. PROCEDURE: Informed consent was obtained from the patient following explanation of the procedure, risks, benefits and alternatives. The patient understands, agrees and consents for the procedure. All questions were addressed. A time out was performed prior to the initiation of the procedure. Maximal barrier sterile technique utilized including caps, mask, sterile gowns, sterile gloves, large sterile drape, hand hygiene, and Betadine prep. The right femoral head  was marked fluoroscopically. Under ultrasound guidance, the right common femoral artery was accessed with a micropuncture kit after the overlying soft tissues were anesthetized with 1% lidocaine. An ultrasound image was saved for documentation purposes. The micropuncture sheath was exchanged for a 5 [place] vascular sheath over a Bentson wire. A closure arteriogram was performed through the side of the sheath confirming access within the right common femoral artery. Over a Bentson wire, a flush pigtail catheter was advanced to the level of the thoracic aorta and a flush thoracic aortogram was performed. Over a Bentson wire, the pigtail catheter was exchanged for a Mickelson catheter was advanced to the level of the thoracic aorta where it was back bled and flushed. The catheter was then utilized to select the bronchial  artery and a selective right bronchial arteriogram was performed. With the use of a Fathom 14 micro wire, a high-flow Renegade micro catheter was advanced to the distal aspect of the right bronchial artery. Selective right bronchial arteriogram was performed and the vessel was embolized with a half vial of 300-500 micron Embospheres. The micro catheter was retracted into the central aspect of the bronchial artery and post embolization selective bronchi arteriogram was performed. Images reviewed in the procedure was terminated. All wires, catheters and sheaths were removed from the patient. Hemostasis was achieved at the right groin access site with manual compression. A dressing was placed. The patient tolerated the procedure well without immediate postprocedural complication. FINDINGS: Flush aortogram demonstrates the origin of the right bronchial artery. Selective right bronchial arteriogram demonstrates patency of the vessel with potential mild vessel irregularity involving several right upper lobe bronchial artery is compatible with the area of bleeding seen on recent bronchoscopy. No definitive non pulmonary arterial supply was identified. Completion right bronchial arteriogram demonstrates patency of the main trunk of the bronchial artery with successful pruning of the distal vascular tree. IMPRESSION: Technically successful embolization of the right bronchial artery for recurrent intermittent hemoptysis. Electronically Signed   By: Sandi Mariscal M.D.   On: 08/28/2016 15:26   Dg Chest Port 1 View  Result Date: 08/24/2016 CLINICAL DATA:  55 year old male with hemoptysis. EXAM: PORTABLE CHEST 1 VIEW COMPARISON:  Chest CT dated 08/22/2016 FINDINGS: A faint area of increased density noted at the right lung base which may represent normal pulmonary vasculature or correspond to the hazy and ground-glass density noted on the CT. The left lung is clear. There is no pleural effusion or pneumothorax. The cardiac  silhouette is within normal limits with no acute osseous pathology. IMPRESSION: Normal pulmonary vasculature versus airspace haziness at the right lung base. No focal consolidation. Electronically Signed   By: Anner Crete M.D.   On: 08/24/2016 05:59   Dg Chest Port 1 View  Result Date: 08/22/2016 CLINICAL DATA:  Hemoptysis. EXAM: PORTABLE CHEST 1 VIEW COMPARISON:  Radiographs and CT scan of August 20, 2016. FINDINGS: The heart size and mediastinal contours are within normal limits. Both lungs are clear. No pneumothorax or pleural effusion is noted. The visualized skeletal structures are unremarkable. IMPRESSION: No acute cardiopulmonary abnormality seen. Electronically Signed   By: Marijo Conception, M.D.   On: 08/22/2016 15:52   [place] Guide Roadmapping  Result Date: 08/28/2016 INDICATION: Recurrent hemoptysis.  Please perform bronchial artery embolization. EXAM: 1. ULTRASOUND GUIDANCE FOR ARTERIAL ACCESS. 2. FLUSH THORACIC AORTOGRAM 3. SELECTIVE RIGHT BRONCHIAL ARTERIOGRAM AND PERCUTANEOUS PARTICLE EMBOLIZATION MEDICATIONS: None ANESTHESIA/SEDATION: Moderate (conscious) sedation was employed during this procedure.  A total of Versed 3 mg and Fentanyl 125 mcg was administered intravenously. Moderate Sedation Time: 45 minutes. The patient's level of consciousness and vital signs were monitored continuously by radiology nursing throughout the procedure under my direct supervision. CONTRAST:  65 cc Isovue-300 FLUOROSCOPY TIME:  Fluoroscopy Time: 12 minutes 54 seconds (606 mGy). COMPLICATIONS: None immediate. PROCEDURE: Informed consent was obtained from the patient following explanation of the procedure, risks, benefits and alternatives. The patient understands, agrees and consents for the procedure. All questions were addressed. A time out was performed prior to the initiation of the procedure. Maximal barrier sterile technique utilized including caps, mask, sterile  gowns, sterile gloves, large sterile drape, hand hygiene, and Betadine prep. The right femoral head was marked fluoroscopically. Under ultrasound guidance, the right common femoral artery was accessed with a micropuncture kit after the overlying soft tissues were anesthetized with 1% lidocaine. An ultrasound image was saved for documentation purposes. The micropuncture sheath was exchanged for a 5 [place] vascular sheath over a Bentson wire. A closure arteriogram was performed through the side of the sheath confirming access within the right common femoral artery. Over a Bentson wire, a flush pigtail catheter was advanced to the level of the thoracic aorta and a flush thoracic aortogram was performed. Over a Bentson wire, the pigtail catheter was exchanged for a Mickelson catheter was advanced to the level of the thoracic aorta where it was back bled and flushed. The catheter was then utilized to select the bronchial artery and a selective right bronchial arteriogram was performed. With the use of a Fathom 14 micro wire, a high-flow Renegade micro catheter was advanced to the distal aspect of the right bronchial artery. Selective right bronchial arteriogram was performed and the vessel was embolized with a half vial of 300-500 micron Embospheres. The micro catheter was retracted into the central aspect of the bronchial artery and post embolization selective bronchi arteriogram was performed. Images reviewed in the procedure was terminated. All wires, catheters and sheaths were removed from the patient. Hemostasis was achieved at the right groin access site with manual compression. A dressing was placed. The patient tolerated the procedure well without immediate postprocedural complication. FINDINGS: Flush aortogram demonstrates the origin of the right bronchial artery. Selective right bronchial arteriogram demonstrates patency of the vessel with potential mild vessel irregularity involving several right upper lobe  bronchial artery is compatible with the area of bleeding seen on recent bronchoscopy. No definitive non pulmonary arterial supply was identified. Completion right bronchial arteriogram demonstrates patency of the main trunk of the bronchial artery with successful pruning of the distal vascular tree. IMPRESSION: Technically successful embolization of the right bronchial artery for recurrent intermittent hemoptysis. Electronically Signed   By: Sandi Mariscal M.D.   On: 08/28/2016 15:26    Time Spent in minutes  35   Johnathon Olden M.D on 08/28/2016 at 6:28 PM  Between 7am to 7pm - Pager - [phone]  After 7pm go to [email or web address] - password [facility]  [facility] -  Office  [phone]

## 2016-08-28 NOTE — Discharge Instructions (Signed)
Embolization, Care After Refer to this sheet in the next few weeks. These instructions provide you with information on caring for yourself after your procedure. Your health care provider may also give you more specific instructions. Your treatment has been planned according to current medical practices, but problems sometimes occur. Call your health care provider if you have any problems or questions after your procedure. WHAT TO EXPECT AFTER THE PROCEDURE After your procedure, it is typical to have the following:   Pain, swelling, or bruising at the puncture site.  Headache.  Loss of appetite, nausea, or vomiting.  Fever. HOME CARE INSTRUCTIONS   Take medicine only as directed by your health care provider.  You can eat what you usually do, but be sure to include lots of fruits, vegetables, and whole grains in your diet. This helps prevent constipation.  Drink enough fluids to keep your urine clear or pale yellow.  Take two 10-minute walks each day.  Do not lift anything heavier than 10 pounds for 1 week after the procedure.  Do not drive for 24 hours after the procedure.  Do not take a bath for 5 days after the procedure. It is okay to take a shower.  You may be able to get back to all your normal activities within 1 week after your procedure. Ask your health care provider when you can return to sexual activity.  Keep all your follow-up appointments. SEEK MEDICAL CARE IF:  Your pain medicine is not helping.  You have a fever.  You have nausea or vomiting.  You have new bruising or swelling in your groin. SEEK IMMEDIATE MEDICAL CARE IF:  You have a fever or persistent symptoms for longer than 3 days.  You have pain in your legs.  You develop swelling and discoloration in your legs.  Your legs become pale and cold or blue.  You develop shortness of breath, feel faint, or pass out.  You have chest pain or trouble breathing.  You develop a cough or cough up  blood.  You develop a rash.  You have side effects from your medicine.  You feel weak or have trouble moving your arms or legs.  You have balance problems.  You have speech or vision problems.   This information is not intended to replace advice given to you by your health care provider. Make sure you discuss any questions you have with your health care provider.   Document Released: 10/25/2013 Document Reviewed: 10/25/2013 Elsevier Interactive Patient Education [employer].

## 2016-08-28 NOTE — Progress Notes (Signed)
[facility]  S: Had hemoptysis overnight again, some hypoxemia; feels OK this morning but wants to leave  O: Vitals:   08/28/16 0313 08/28/16 0400 08/28/16 0416 08/28/16 0600  BP:   (!) 127/92   Pulse:  (!) 55 67 (!) 52  Resp:  13 12 12   Temp: 97.6 F (36.4 C)     TempSrc: Oral     SpO2:  95% 95% 98%  Weight:      Height:       O2 saturation now on RA  Gen: no distress in bed HENT: OP clear, neck supple PULM: CTA B, normal percussion CV: RRR, no mgr, trace edema GI: BS+, soft, nontender Derm: no cyanosis or rash Psyche: normal mood and affect  CBC    Component Value Date/Time   WBC 5.0 08/28/2016 0341   RBC 3.85 (L) 08/28/2016 0341   HGB 10.5 (L) 08/28/2016 0341   HGB 13.1 05/23/2016 0833   HCT 32.5 (L) 08/28/2016 0341   HCT 40.4 05/23/2016 0833   PLT 372 08/28/2016 0341   PLT 194 05/23/2016 0833   MCV 84.4 08/28/2016 0341   MCV 80.2 05/23/2016 0833   MCH 27.3 08/28/2016 0341   MCHC 32.3 08/28/2016 0341   RDW 16.1 (H) 08/28/2016 0341   RDW 16.6 (H) 05/23/2016 0833   LYMPHSABS 1.2 08/28/2016 0341   LYMPHSABS 1.0 05/23/2016 0833   MONOABS 0.4 08/28/2016 0341   MONOABS 0.3 05/23/2016 0833   EOSABS 0.1 08/28/2016 0341   EOSABS 0.1 05/23/2016 0833   BASOSABS 0.0 08/28/2016 0341   BASOSABS 0.0 05/23/2016 0833   Impression/Plan:    Hemoptysis: persistent; Specifically from the right upper lobe posterior segment.  There is no evidence of malignancy there.  However this is the same location of bleeding as 2009.  I suspect there is either a focal area of bronchiectasis not seen well on his CT or an enlarged blood vessel that bleeds when he is acutely infected.  We talked about conservative management (antibiotics alone) vs embolization of the RUL.  I explained the relative risk of recurrent hemoptysis again is high (> 50%) given two similar incidents.    >He would like to proceed with embolization if IR feels appropriate. Will discuss with IR today.  Nocturnal hypoxemia:  needs outpatient sleep study  Will need f/u with Dr. Halford Chessman as outpatient    Roselie Awkward, MD [facility] Pager: [phone] Cell: [phone] After 3pm or if no response, call [phone]

## 2016-08-28 NOTE — Procedures (Signed)
Post bronchial artery embolization.   No immediate post procedural complications.   EBL: None Keep right leg straight for 4 hrs.    SignedSandi Mariscal [phone] 08/28/2016, 3:04 PM

## 2016-08-28 NOTE — Progress Notes (Signed)
Patient ID: Charles Bowman, male   DOB: 20-Feb-1961, 55 y.o.   MRN: [id number] Patient states this morning that he has had 3 separate episodes of additional hemoptysis since evaluated by our service yesterday. Vital signs stable, afebrile; labs checked. Case reviewed again with Dr. Pascal Lux this morning. At this time we'll plan to proceed with right bronchial arteriogram today with possible embolization. Details/risks of procedure, including but not limited to, internal bleeding, infection, contrast nephropathy,paralysis,  inability to locate or embolize target vessel(s) and nontarget embolization discussed with patient and wife with their understanding and consent.

## 2016-08-29 DIAGNOSIS — E119 Type 2 diabetes mellitus without complications: Secondary | ICD-10-CM

## 2016-08-29 LAB — CBC WITH DIFFERENTIAL/PLATELET
BASOS PCT: 0 %
Basophils Absolute: 0 10*3/uL (ref 0.0–0.1)
EOS ABS: 0.1 10*3/uL (ref 0.0–0.7)
EOS PCT: 2 %
HCT: 33.8 % — ABNORMAL LOW (ref 39.0–52.0)
Hemoglobin: 11 g/dL — ABNORMAL LOW (ref 13.0–17.0)
LYMPHS ABS: 0.8 10*3/uL (ref 0.7–4.0)
Lymphocytes Relative: 15 %
MCH: 27.3 pg (ref 26.0–34.0)
MCHC: 32.5 g/dL (ref 30.0–36.0)
MCV: 83.9 fL (ref 78.0–100.0)
MONOS PCT: 6 %
Monocytes Absolute: 0.3 10*3/uL (ref 0.1–1.0)
Neutro Abs: 4 10*3/uL (ref 1.7–7.7)
Neutrophils Relative %: 77 %
PLATELETS: 428 10*3/uL — AB (ref 150–400)
RBC: 4.03 MIL/uL — ABNORMAL LOW (ref 4.22–5.81)
RDW: 15.9 % — ABNORMAL HIGH (ref 11.5–15.5)
WBC: 5.2 10*3/uL (ref 4.0–10.5)

## 2016-08-29 LAB — BASIC METABOLIC PANEL
Anion gap: 8 (ref 5–15)
BUN: 25 mg/dL — AB (ref 6–20)
CALCIUM: 9 mg/dL (ref 8.9–10.3)
CO2: 26 mmol/L (ref 22–32)
CREATININE: 1.23 mg/dL (ref 0.61–1.24)
Chloride: 100 mmol/L — ABNORMAL LOW (ref 101–111)
Glucose, Bld: 325 mg/dL — ABNORMAL HIGH (ref 65–99)
Potassium: 4.4 mmol/L (ref 3.5–5.1)
SODIUM: 134 mmol/L — AB (ref 135–145)

## 2016-08-29 LAB — GLUCOSE, CAPILLARY
GLUCOSE-CAPILLARY: 238 mg/dL — AB (ref 65–99)
Glucose-Capillary: 179 mg/dL — ABNORMAL HIGH (ref 65–99)

## 2016-08-29 MED ORDER — LISINOPRIL 40 MG PO TABS: 40.0000 mg | ORAL_TABLET | Freq: Every day | ORAL | 0 refills | Status: AC

## 2016-08-29 MED ORDER — ANAGRELIDE HCL 1 MG PO CAPS: 3.0000 mg | ORAL_CAPSULE | Freq: Every day | ORAL | 6 refills | Status: DC

## 2016-08-29 NOTE — Progress Notes (Addendum)
[facility]  S: No further hemoptysis after embolization yesterday  O: Vitals:   08/28/16 2000 08/28/16 2112 08/29/16 0551 08/29/16 1010  BP: 137/66 (!) 150/78 (!) 150/67 136/81  Pulse: 71 65 (!) 58 72  Resp: 17 18 17    Temp:  97.9 F (36.6 C) 97.8 F (36.6 C)   TempSrc:  Oral Oral   SpO2: 93% 95% 98%   Weight:  187 lb 9.6 oz (85.1 kg)    Height:  5\' 7"  (1.702 m)     O2 saturation now on RA  Gen: no distress in bed HENT: OP clear, neck supple PULM: CTA B, normal percussion CV: RRR, no mgr, trace edema GI: BS+, soft, nontender Derm: no cyanosis or rash Psyche: normal mood and affect  CBC    Component Value Date/Time   WBC 5.2 08/29/2016 0934   RBC 4.03 (L) 08/29/2016 0934   HGB 11.0 (L) 08/29/2016 0934   HGB 13.1 05/23/2016 0833   HCT 33.8 (L) 08/29/2016 0934   HCT 40.4 05/23/2016 0833   PLT 428 (H) 08/29/2016 0934   PLT 194 05/23/2016 0833   MCV 83.9 08/29/2016 0934   MCV 80.2 05/23/2016 0833   MCH 27.3 08/29/2016 0934   MCHC 32.5 08/29/2016 0934   RDW 15.9 (H) 08/29/2016 0934   RDW 16.6 (H) 05/23/2016 0833   LYMPHSABS 0.8 08/29/2016 0934   LYMPHSABS 1.0 05/23/2016 0833   MONOABS 0.3 08/29/2016 0934   MONOABS 0.3 05/23/2016 0833   EOSABS 0.1 08/29/2016 0934   EOSABS 0.1 05/23/2016 0833   BASOSABS 0.0 08/29/2016 0934   BASOSABS 0.0 05/23/2016 0833   Impression/Plan:    Hemoptysis: Resolved post embolization. If recurs we'll need to consider posterior segment right upper lobe segmentectomy. Otherwise, stable to go home as he has been breathing comfortably on room air for several days. IR services appreciated. 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 >We will make arrangements for him to follow-up with the pulmonary clinic.   Nocturnal hypoxemia: needs outpatient sleep study  Will need f/u with Dr. Halford Chessman as outpatient     Roselie Awkward, MD [facility] Pager: [phone] Cell: [phone] After 3pm or if no response, call [phone]

## 2016-08-29 NOTE — Progress Notes (Signed)
Referring Physician(s): McQuaid,D  Supervising Physician: Aletta Edouard  Patient Status:  [facility] IP  Chief Complaint:  hemoptysis  Subjective:  Pt doing well; has had no further hemoptysis since embolization procedure; currently denies sig dyspnea, cough; has had some intermittent rt upper chest "burning" sensation but no persistent CP  Allergies: Review of patient's allergies indicates no known allergies.  Medications: Prior to Admission medications   Medication Sig Start Date End Date Taking? Authorizing Provider  anagrelide (AGRYLIN) 1 MG capsule TAKE 3 CAPSULES BY MOUTH DAILY. Patient taking differently: TAKE 2 IN THE  MORNING AND 1 IN THE EVENING 08/11/16  Yes Heath Lark, MD  aspirin 81 MG tablet Take 81 mg by mouth daily.   Yes Historical Provider, MD  atorvastatin (LIPITOR) 20 MG tablet Take 1 tablet (20 mg total) by mouth daily. NO MORE REFILLS WITHOUT OFFICE VISIT/ LABS - 2ND NOTICE 08/31/15  Yes Roselee Culver, MD  fluticasone [facility]) 50 MCG/ACT nasal spray PLACE 2 SPRAYS IN EACH NOSTRIL EVERY DAY 09/18/15  Yes Jaynee Eagles, PA-C  glimepiride (AMARYL) 4 MG tablet TAKE 1 TABLET (4 MG TOTAL) BY MOUTH DAILY BEFORE BREAKFAST.  "NO MORE REFILLS WITHOUT OV" Patient taking differently: 4 mg. TAKE 1 TABLET (4 MG TOTAL) BY MOUTH DAILY BEFORE BREAKFAST.  "NO MORE REFILLS WITHOUT OV" 06/03/15  Yes Ezekiel Slocumb, PA-C  hydrochlorothiazide (MICROZIDE) 12.5 MG capsule TAKE ONE CAPSULE BY MOUTH EVERY DAY 02/14/16  Yes Jaynee Eagles, PA-C  lisinopril (PRINIVIL,ZESTRIL) 20 MG tablet Take 20 mg by mouth daily.  05/12/16  Yes Historical Provider, MD  LORazepam (ATIVAN) 1 MG tablet TAKE 1 TABLET BY MOUTH 3 TIMES A DAY AS NEEDED FOR ANXIETY 01/25/15  Yes Roselee Culver, MD  MATZIM LA 420 MG 24 hr tablet TAKE 1 TABLET (420 MG TOTAL) BY MOUTH DAILY. 03/18/16  Yes Jaynee Eagles, PA-C  metFORMIN (GLUCOPHAGE-XR) 500 MG 24 hr tablet TAKE 4 TABLETS BY MOUTH EVERY DAY  "OFFICE VISIT NEEDED" Patient  taking differently: Take 1,000 mg by mouth 2 (two) times daily.  09/16/15  Yes Roselee Culver, MD  Multiple Vitamins-Minerals (MULTIVITAMIN WITH MINERALS) tablet Take 1 tablet by mouth daily.   Yes Historical Provider, MD  SYNTHROID 175 MCG tablet TAKE 1 TABLET (175 MCG TOTAL) BY MOUTH DAILY  "NO MORE REFILLS WITHOUT OFFICE VISIT" 10/16/15  Yes Roselee Culver, MD  glucose blood (ONE TOUCH ULTRA TEST) test strip Use as instructed 11/15/13   Barton Fanny, MD  Lancets [facility] ULTRASOFT) lancets Use as instructed 11/15/13   Barton Fanny, MD     Vital Signs: BP (!) 150/67 (BP Location: Left Arm)   Pulse (!) 58   Temp 97.8 F (36.6 C) (Oral)   Resp 17   Ht '5\' 7"'  (1.702 m)   Wt 187 lb 9.6 oz (85.1 kg)   SpO2 98%   BMI 29.38 kg/m   Physical Exam awake/alert; puncture site rt CFA soft, clean, mildly tender, no distinct hematoma; intact distal pulses  Imaging: Ir Angiogram Visceral Selective  Result Date: 08/28/2016 INDICATION: Recurrent hemoptysis.  Please perform bronchial artery embolization. EXAM: 1. ULTRASOUND GUIDANCE FOR ARTERIAL ACCESS. 2. FLUSH THORACIC AORTOGRAM 3. SELECTIVE RIGHT BRONCHIAL ARTERIOGRAM AND PERCUTANEOUS PARTICLE EMBOLIZATION MEDICATIONS: None ANESTHESIA/SEDATION: Moderate (conscious) sedation was employed during this procedure. A total of Versed 3 mg and Fentanyl 125 mcg was administered intravenously. Moderate Sedation Time: 45 minutes. The patient's level of consciousness and vital signs were monitored continuously by radiology nursing throughout the  procedure under my direct supervision. CONTRAST:  65 cc Isovue-300 FLUOROSCOPY TIME:  Fluoroscopy Time: 12 minutes 54 seconds (606 mGy). COMPLICATIONS: None immediate. PROCEDURE: Informed consent was obtained from the patient following explanation of the procedure, risks, benefits and alternatives. The patient understands, agrees and consents for the procedure. All questions were addressed. A time out was  performed prior to the initiation of the procedure. Maximal barrier sterile technique utilized including caps, mask, sterile gowns, sterile gloves, large sterile drape, hand hygiene, and Betadine prep. The right femoral head was marked fluoroscopically. Under ultrasound guidance, the right common femoral artery was accessed with a micropuncture kit after the overlying soft tissues were anesthetized with 1% lidocaine. An ultrasound image was saved for documentation purposes. The micropuncture sheath was exchanged for a 5 [place] vascular sheath over a Bentson wire. A closure arteriogram was performed through the side of the sheath confirming access within the right common femoral artery. Over a Bentson wire, a flush pigtail catheter was advanced to the level of the thoracic aorta and a flush thoracic aortogram was performed. Over a Bentson wire, the pigtail catheter was exchanged for a Mickelson catheter was advanced to the level of the thoracic aorta where it was back bled and flushed. The catheter was then utilized to select the bronchial artery and a selective right bronchial arteriogram was performed. With the use of a Fathom 14 micro wire, a high-flow Renegade micro catheter was advanced to the distal aspect of the right bronchial artery. Selective right bronchial arteriogram was performed and the vessel was embolized with a half vial of 300-500 micron Embospheres. The micro catheter was retracted into the central aspect of the bronchial artery and post embolization selective bronchi arteriogram was performed. Images reviewed in the procedure was terminated. All wires, catheters and sheaths were removed from the patient. Hemostasis was achieved at the right groin access site with manual compression. A dressing was placed. The patient tolerated the procedure well without immediate postprocedural complication. FINDINGS: Flush aortogram demonstrates the origin of the right bronchial artery. Selective right bronchial  arteriogram demonstrates patency of the vessel with potential mild vessel irregularity involving several right upper lobe bronchial artery is compatible with the area of bleeding seen on recent bronchoscopy. No definitive non pulmonary arterial supply was identified. Completion right bronchial arteriogram demonstrates patency of the main trunk of the bronchial artery with successful pruning of the distal vascular tree. IMPRESSION: Technically successful embolization of the right bronchial artery for recurrent intermittent hemoptysis. Electronically Signed   By: Sandi Mariscal M.D.   On: 08/28/2016 15:26   Ir US Guide Vasc Access Right  Result Date: 08/28/2016 INDICATION: Recurrent hemoptysis.  Please perform bronchial artery embolization. EXAM: 1. ULTRASOUND GUIDANCE FOR ARTERIAL ACCESS. 2. FLUSH THORACIC AORTOGRAM 3. SELECTIVE RIGHT BRONCHIAL ARTERIOGRAM AND PERCUTANEOUS PARTICLE EMBOLIZATION MEDICATIONS: None ANESTHESIA/SEDATION: Moderate (conscious) sedation was employed during this procedure. A total of Versed 3 mg and Fentanyl 125 mcg was administered intravenously. Moderate Sedation Time: 45 minutes. The patient's level of consciousness and vital signs were monitored continuously by radiology nursing throughout the procedure under my direct supervision. CONTRAST:  65 cc Isovue-300 FLUOROSCOPY TIME:  Fluoroscopy Time: 12 minutes 54 seconds (606 mGy). COMPLICATIONS: None immediate. PROCEDURE: Informed consent was obtained from the patient following explanation of the procedure, risks, benefits and alternatives. The patient understands, agrees and consents for the procedure. All questions were addressed. A time out was performed prior to the initiation of the procedure. Maximal barrier sterile technique utilized including caps, mask,  sterile gowns, sterile gloves, large sterile drape, hand hygiene, and Betadine prep. The right femoral head was marked fluoroscopically. Under ultrasound guidance, the right common  femoral artery was accessed with a micropuncture kit after the overlying soft tissues were anesthetized with 1% lidocaine. An ultrasound image was saved for documentation purposes. The micropuncture sheath was exchanged for a 5 [place] vascular sheath over a Bentson wire. A closure arteriogram was performed through the side of the sheath confirming access within the right common femoral artery. Over a Bentson wire, a flush pigtail catheter was advanced to the level of the thoracic aorta and a flush thoracic aortogram was performed. Over a Bentson wire, the pigtail catheter was exchanged for a Mickelson catheter was advanced to the level of the thoracic aorta where it was back bled and flushed. The catheter was then utilized to select the bronchial artery and a selective right bronchial arteriogram was performed. With the use of a Fathom 14 micro wire, a high-flow Renegade micro catheter was advanced to the distal aspect of the right bronchial artery. Selective right bronchial arteriogram was performed and the vessel was embolized with a half vial of 300-500 micron Embospheres. The micro catheter was retracted into the central aspect of the bronchial artery and post embolization selective bronchi arteriogram was performed. Images reviewed in the procedure was terminated. All wires, catheters and sheaths were removed from the patient. Hemostasis was achieved at the right groin access site with manual compression. A dressing was placed. The patient tolerated the procedure well without immediate postprocedural complication. FINDINGS: Flush aortogram demonstrates the origin of the right bronchial artery. Selective right bronchial arteriogram demonstrates patency of the vessel with potential mild vessel irregularity involving several right upper lobe bronchial artery is compatible with the area of bleeding seen on recent bronchoscopy. No definitive non pulmonary arterial supply was identified. Completion right bronchial  arteriogram demonstrates patency of the main trunk of the bronchial artery with successful pruning of the distal vascular tree. IMPRESSION: Technically successful embolization of the right bronchial artery for recurrent intermittent hemoptysis. Electronically Signed   By: Sandi Mariscal M.D.   On: 08/28/2016 15:26   [place] Guide Roadmapping  Result Date: 08/28/2016 INDICATION: Recurrent hemoptysis.  Please perform bronchial artery embolization. EXAM: 1. ULTRASOUND GUIDANCE FOR ARTERIAL ACCESS. 2. FLUSH THORACIC AORTOGRAM 3. SELECTIVE RIGHT BRONCHIAL ARTERIOGRAM AND PERCUTANEOUS PARTICLE EMBOLIZATION MEDICATIONS: None ANESTHESIA/SEDATION: Moderate (conscious) sedation was employed during this procedure. A total of Versed 3 mg and Fentanyl 125 mcg was administered intravenously. Moderate Sedation Time: 45 minutes. The patient's level of consciousness and vital signs were monitored continuously by radiology nursing throughout the procedure under my direct supervision. CONTRAST:  65 cc Isovue-300 FLUOROSCOPY TIME:  Fluoroscopy Time: 12 minutes 54 seconds (606 mGy). COMPLICATIONS: None immediate. PROCEDURE: Informed consent was obtained from the patient following explanation of the procedure, risks, benefits and alternatives. The patient understands, agrees and consents for the procedure. All questions were addressed. A time out was performed prior to the initiation of the procedure. Maximal barrier sterile technique utilized including caps, mask, sterile gowns, sterile gloves, large sterile drape, hand hygiene, and Betadine prep. The right femoral head was marked fluoroscopically. Under ultrasound guidance, the right common femoral artery was accessed with a micropuncture kit after the overlying soft tissues were anesthetized with 1% lidocaine. An ultrasound image was saved for documentation purposes. The micropuncture sheath was exchanged for a 5 [place] vascular sheath over a  Bentson wire. A  closure arteriogram was performed through the side of the sheath confirming access within the right common femoral artery. Over a Bentson wire, a flush pigtail catheter was advanced to the level of the thoracic aorta and a flush thoracic aortogram was performed. Over a Bentson wire, the pigtail catheter was exchanged for a Mickelson catheter was advanced to the level of the thoracic aorta where it was back bled and flushed. The catheter was then utilized to select the bronchial artery and a selective right bronchial arteriogram was performed. With the use of a Fathom 14 micro wire, a high-flow Renegade micro catheter was advanced to the distal aspect of the right bronchial artery. Selective right bronchial arteriogram was performed and the vessel was embolized with a half vial of 300-500 micron Embospheres. The micro catheter was retracted into the central aspect of the bronchial artery and post embolization selective bronchi arteriogram was performed. Images reviewed in the procedure was terminated. All wires, catheters and sheaths were removed from the patient. Hemostasis was achieved at the right groin access site with manual compression. A dressing was placed. The patient tolerated the procedure well without immediate postprocedural complication. FINDINGS: Flush aortogram demonstrates the origin of the right bronchial artery. Selective right bronchial arteriogram demonstrates patency of the vessel with potential mild vessel irregularity involving several right upper lobe bronchial artery is compatible with the area of bleeding seen on recent bronchoscopy. No definitive non pulmonary arterial supply was identified. Completion right bronchial arteriogram demonstrates patency of the main trunk of the bronchial artery with successful pruning of the distal vascular tree. IMPRESSION: Technically successful embolization of the right bronchial artery for recurrent intermittent hemoptysis. Electronically  Signed   By: Sandi Mariscal M.D.   On: 08/28/2016 15:26    Labs:  CBC:  Recent Labs  08/25/16 0311 08/26/16 0316 08/27/16 0330 08/28/16 0341  WBC 6.3 5.4 5.5 5.0  HGB 12.2* 11.3* 11.0* 10.5*  HCT 38.3* 35.1* 34.3* 32.5*  PLT 266 272 336 372    COAGS:  Recent Labs  08/22/16 0954  INR 0.94    BMP:  Recent Labs  08/23/16 0326 08/24/16 0358 08/25/16 0311 08/28/16 0341  NA 137 136 138 138  K 4.3 4.4 4.2 4.6  CL 103 99* 101 103  CO2 '27 28 30 28  ' GLUCOSE 141* 197* 154* 165*  BUN 19 20 22* 26*  CALCIUM 8.8* 9.7 9.3 8.9  CREATININE 0.93 0.94 1.10 1.15  GFRNONAA >60 >60 >60 >60  GFRAA >60 >60 >60 >60    LIVER FUNCTION TESTS:  Recent Labs  08/20/16 1605 08/22/16 0950  BILITOT 0.7 0.8  AST 27 29  ALT 38 39  ALKPHOS 92 97  PROT 7.4 7.9  ALBUMIN 4.3 4.5    Assessment and Plan: Pt with hx recurrent hemoptysis, s/p rt bronchial artery embolization 10/26; currently stable with no further hemoptysis since embolization procedure; labs pend; BAL cx's neg to date; additional plans as per primary team   Electronically Signed: D. Rowe Robert 08/29/2016, 9:17 AM   I spent a total of 15 minutes at the the patient's bedside AND on the patient's hospital floor or unit, greater than 50% of which was counseling/coordinating care for right bronchial artery embolization    Patient ID: Charles Bowman, male   DOB: [birth year], 55 y.o.   MRN: [id number]

## 2016-09-01 ENCOUNTER — Telehealth: Payer: Self-pay | Admitting: *Deleted

## 2016-09-01 NOTE — Telephone Encounter (Signed)
I can see him either at 11 am or 3 pm tomorrow, check in 15 mins early Let me know what time works better and I will see put him on my schedule

## 2016-09-01 NOTE — Discharge Summary (Signed)
Physician Discharge Summary  Charles Bowman BSJ:[id number] DOB: July 19, 1961 DOA: 08/22/2016  PCP: [name], DO  Admit date: 08/22/2016 Discharge date: 08/29/2016  Admitted From: Home Disposition:  Home  Recommendations for Outpatient Follow-up:  1. Follow up with PCP in 1-2 weeks 2. Please obtain BMP/CBC in one week 3. Please follow up on the following pending results:    Discharge Condition:STABLE. CODE STATUS:full code.  Diet recommendation: Heart Healthy  Brief/Interim Summary: 55 year old male with history of essential thrombocytosis on Anagrelide, hypertension, diabetes mellitus and hypothyroidism presented with hemoptysis.  Discharge Diagnoses:  Principal Problem:   Hemoptysis Active Problems:   Essential thrombocythemia ([place])   Hypothyroidism   Anemia in chronic illness   Essential hypertension   Anxiety   Diabetes mellitus type 2, uncomplicated (HCC)   Murmur   Hemoptysis CT angiogram of the chest concerning for pneumonia. PC CM following. Seen by ENT and no signs of upper airway bleeding.  bronchoscopy done revealed     " A blot clot was found in the posterior segment of the right upper lobe (B2). It was completely obstructing the airway.The clot was successfully removed, but the lesion began bleeding. BAL  was performed in the RUL posterior segment (B2) of the lung and sent for bacterial, AFB and fungal analysis." ASA and anagrelide discontinued. Discussed with Dr [name].  H&H stable.    Three more episodes of hemoptysis.  IR consulted for embolization.  Underwent post bronchial artery embolization. No hemoptysis after embolization.      Active Problems: ? lobar pneumonia Afebrile. Doesn't appear toxic, white count normal. . Completed zosyn.       Essential thrombocythemia ([place]) Hold ASA and acrylin. Platelets stable. Follows with Dr [name]. Discussed with her regarding holding agrylin and ASA.     Hypothyroidism Continue  Synthroid.  OSA Continue CPAP     Essential hypertension Better controlled today.  2-D echo shows EF of 45-50% with diffuse hypokinesis. Dr [name] Labs discussed with cardiology who recommended this likely from poorly controlled hypertension and patient should follow-up with his primary cardiologist Dr. [name] as outpatient.  Hyponatremia Mild, secondary to dehydration. Resolved with fluids     Diabetes mellitus type 2, uncomplicated ([place]) Resume home meds on discharge.    Anxiety  low dose ativan prn while inpatient, . Resolved.     Discharge Instructions  Discharge Instructions    Diet - low sodium heart healthy    Complete by:  As directed    Discharge instructions    Complete by:  As directed    Please follow upwith Dr [name] in 1 to 2 weeks, please call for appt.  Please follow up with Dr Halford Chessman, in 1 to 2 weeks.       Medication List    STOP taking these medications   aspirin 81 MG tablet     TAKE these medications   anagrelide 1 MG capsule Commonly known as:  AGRYLIN Take 3 capsules (3 mg total) by mouth daily. What changed:  See the new instructions.   atorvastatin 20 MG tablet Commonly known as:  LIPITOR Take 1 tablet (20 mg total) by mouth daily. NO MORE REFILLS WITHOUT OFFICE VISIT/ LABS - 2ND NOTICE   fluticasone 50 MCG/ACT nasal spray Commonly known as:  FLONASE PLACE 2 SPRAYS IN EACH NOSTRIL EVERY DAY   glimepiride 4 MG tablet Commonly known as:  AMARYL TAKE 1 TABLET (4 MG TOTAL) BY MOUTH DAILY BEFORE BREAKFAST.  "NO MORE REFILLS WITHOUT OV" What changed:  how  much to take  additional instructions   glucose blood test strip Commonly known as:  ONE TOUCH ULTRA TEST Use as instructed   hydrochlorothiazide 12.5 MG capsule Commonly known as:  MICROZIDE TAKE ONE CAPSULE BY MOUTH EVERY DAY   lisinopril 40 MG tablet Commonly known as:  PRINIVIL,ZESTRIL Take 1 tablet (40 mg total) by mouth daily. What changed:  medication  strength  how much to take   LORazepam 1 MG tablet Commonly known as:  ATIVAN TAKE 1 TABLET BY MOUTH 3 TIMES A DAY AS NEEDED FOR ANXIETY   MATZIM LA 420 MG 24 hr tablet Generic drug:  diltiazem TAKE 1 TABLET (420 MG TOTAL) BY MOUTH DAILY.   metFORMIN 500 MG 24 hr tablet Commonly known as:  GLUCOPHAGE-XR TAKE 4 TABLETS BY MOUTH EVERY DAY  "OFFICE VISIT NEEDED" What changed:  how much to take  how to take this  when to take this  additional instructions   multivitamin with minerals tablet Take 1 tablet by mouth daily.   onetouch ultrasoft lancets Use as instructed   SYNTHROID 175 MCG tablet Generic drug:  levothyroxine TAKE 1 TABLET (175 MCG TOTAL) BY MOUTH DAILY  "NO MORE REFILLS WITHOUT OFFICE VISIT"       No Known Allergies  Consultations:  Pulmonology  IR.    Procedures/Studies: Dg Chest 2 View  Result Date: 08/20/2016 CLINICAL DATA:  Hemoptysis. EXAM: CHEST  2 VIEW COMPARISON:  Radiographs of January 23, 2016. FINDINGS: The heart size and mediastinal contours are within normal limits. Both lungs are clear. No pneumothorax or pleural effusion is noted. The visualized skeletal structures are unremarkable. IMPRESSION: No active cardiopulmonary disease. Electronically Signed   By: James  Green Jr, M.D.   On: 08/20/2016 10:29   Ct Chest W Contrast  Result Date: 08/20/2016 CLINICAL DATA:  Hemoptysis and history of thrombocytopenia EXAM: CT CHEST WITH CONTRAST TECHNIQUE: Multidetector CT imaging of the chest was performed during intravenous contrast administration. CONTRAST:  75mL ISOVUE-300 IOPAMIDOL (ISOVUE-300) INJECTION 61% COMPARISON:  Same day chest radiographs FINDINGS: Cardiovascular: No significant vascular findings. Mild atherosclerosis of the aorta. Normal heart size. No pericardial effusion. Mediastinum/Nodes: No axillary, supraclavicular, mediastinal nor hilar lymphadenopathy. Small hiatal hernia. Lungs/Pleura: No evidence of bronchiectasis. A few small  right apical pulmonary blebs are noted. Centrilobular emphysema, upper lobe predominant. No pneumonic consolidation, effusion or CHF. Upper Abdomen: No acute abnormality. Musculoskeletal: No chest wall abnormality. No acute or significant osseous findings. IMPRESSION: Upper lobe predominant centrilobular emphysema. No acute cardiopulmonary disease. Aortic atherosclerosis. Electronically Signed   By: Kirby  Kwon M.D.   On: 08/20/2016 17:43   Ct Angio Chest Pe W Or Wo Contrast  Result Date: 08/22/2016 CLINICAL DATA:  Hemoptysis.  Thrombocytosis. EXAM: CT ANGIOGRAPHY CHEST WITH CONTRAST TECHNIQUE: Multidetector CT imaging of the chest was performed using the standard protocol during bolus administration of intravenous contrast. Multiplanar CT image reconstructions and MIPs were obtained to evaluate the vascular anatomy. CONTRAST:  100 cc Isovue 370 COMPARISON:  CT chest from 2 days ago, 08/20/2016 FINDINGS: Cardiovascular: No filling defect is identified in the pulmonary arterial tree to suggest pulmonary embolus. Mild atherosclerotic calcification of the aortic arch. Mediastinum/Nodes: No obvious esophageal abnormality. No adenopathy. Lungs/Pleura: Centrilobular emphysema. Mosaic attenuation in the lower lobes and right middle lobe and to a lesser extent posteriorly in the right upper lobe, subtle sub solid nodularity in a centrilobular distribution. Upper Abdomen: Unremarkable Musculoskeletal: Unremarkable Review of the MIP images confirms the above findings. IMPRESSION: 1. No filling defect is   identified in the pulmonary arterial tree to suggest pulmonary embolus. 2. Very faint centrilobular sub solid nodularity causing a mosaic attenuation appearance primarily in the right middle lobe and in the lower lobes. Differential diagnostic considerations favor hypersensitivity pneumonitis, respiratory bronchiolitis, or atypical infectious process. 3. Centrilobular emphysema. Electronically Signed   By: Van Clines M.D.   On: 08/22/2016 18:13   Ir Angiogram Visceral Selective  Result Date: 08/28/2016 INDICATION: Recurrent hemoptysis.  Please perform bronchial artery embolization. EXAM: 1. ULTRASOUND GUIDANCE FOR ARTERIAL ACCESS. 2. FLUSH THORACIC AORTOGRAM 3. SELECTIVE RIGHT BRONCHIAL ARTERIOGRAM AND PERCUTANEOUS PARTICLE EMBOLIZATION MEDICATIONS: None ANESTHESIA/SEDATION: Moderate (conscious) sedation was employed during this procedure. A total of Versed 3 mg and Fentanyl 125 mcg was administered intravenously. Moderate Sedation Time: 45 minutes. The patient's level of consciousness and vital signs were monitored continuously by radiology nursing throughout the procedure under my direct supervision. CONTRAST:  65 cc Isovue-300 FLUOROSCOPY TIME:  Fluoroscopy Time: 12 minutes 54 seconds (606 mGy). COMPLICATIONS: None immediate. PROCEDURE: Informed consent was obtained from the patient following explanation of the procedure, risks, benefits and alternatives. The patient understands, agrees and consents for the procedure. All questions were addressed. A time out was performed prior to the initiation of the procedure. Maximal barrier sterile technique utilized including caps, mask, sterile gowns, sterile gloves, large sterile drape, hand hygiene, and Betadine prep. The right femoral head was marked fluoroscopically. Under ultrasound guidance, the right common femoral artery was accessed with a micropuncture kit after the overlying soft tissues were anesthetized with 1% lidocaine. An ultrasound image was saved for documentation purposes. The micropuncture sheath was exchanged for a 5 [place] vascular sheath over a Bentson wire. A closure arteriogram was performed through the side of the sheath confirming access within the right common femoral artery. Over a Bentson wire, a flush pigtail catheter was advanced to the level of the thoracic aorta and a flush thoracic aortogram was performed. Over a Bentson wire, the  pigtail catheter was exchanged for a Mickelson catheter was advanced to the level of the thoracic aorta where it was back bled and flushed. The catheter was then utilized to select the bronchial artery and a selective right bronchial arteriogram was performed. With the use of a Fathom 14 micro wire, a high-flow Renegade micro catheter was advanced to the distal aspect of the right bronchial artery. Selective right bronchial arteriogram was performed and the vessel was embolized with a half vial of 300-500 micron Embospheres. The micro catheter was retracted into the central aspect of the bronchial artery and post embolization selective bronchi arteriogram was performed. Images reviewed in the procedure was terminated. All wires, catheters and sheaths were removed from the patient. Hemostasis was achieved at the right groin access site with manual compression. A dressing was placed. The patient tolerated the procedure well without immediate postprocedural complication. FINDINGS: Flush aortogram demonstrates the origin of the right bronchial artery. Selective right bronchial arteriogram demonstrates patency of the vessel with potential mild vessel irregularity involving several right upper lobe bronchial artery is compatible with the area of bleeding seen on recent bronchoscopy. No definitive non pulmonary arterial supply was identified. Completion right bronchial arteriogram demonstrates patency of the main trunk of the bronchial artery with successful pruning of the distal vascular tree. IMPRESSION: Technically successful embolization of the right bronchial artery for recurrent intermittent hemoptysis. Electronically Signed   By: Sandi Mariscal M.D.   On: 08/28/2016 15:26   Ir US Guide Vasc Access Right  Result Date: 08/28/2016 INDICATION: Recurrent  hemoptysis.  Please perform bronchial artery embolization. EXAM: 1. ULTRASOUND GUIDANCE FOR ARTERIAL ACCESS. 2. FLUSH THORACIC AORTOGRAM 3. SELECTIVE RIGHT BRONCHIAL  ARTERIOGRAM AND PERCUTANEOUS PARTICLE EMBOLIZATION MEDICATIONS: None ANESTHESIA/SEDATION: Moderate (conscious) sedation was employed during this procedure. A total of Versed 3 mg and Fentanyl 125 mcg was administered intravenously. Moderate Sedation Time: 45 minutes. The patient's level of consciousness and vital signs were monitored continuously by radiology nursing throughout the procedure under my direct supervision. CONTRAST:  65 cc Isovue-300 FLUOROSCOPY TIME:  Fluoroscopy Time: 12 minutes 54 seconds (606 mGy). COMPLICATIONS: None immediate. PROCEDURE: Informed consent was obtained from the patient following explanation of the procedure, risks, benefits and alternatives. The patient understands, agrees and consents for the procedure. All questions were addressed. A time out was performed prior to the initiation of the procedure. Maximal barrier sterile technique utilized including caps, mask, sterile gowns, sterile gloves, large sterile drape, hand hygiene, and Betadine prep. The right femoral head was marked fluoroscopically. Under ultrasound guidance, the right common femoral artery was accessed with a micropuncture kit after the overlying soft tissues were anesthetized with 1% lidocaine. An ultrasound image was saved for documentation purposes. The micropuncture sheath was exchanged for a 5 French vascular sheath over a Bentson wire. A closure arteriogram was performed through the side of the sheath confirming access within the right common femoral artery. Over a Bentson wire, a flush pigtail catheter was advanced to the level of the thoracic aorta and a flush thoracic aortogram was performed. Over a Bentson wire, the pigtail catheter was exchanged for a Mickelson catheter was advanced to the level of the thoracic aorta where it was back bled and flushed. The catheter was then utilized to select the bronchial artery and a selective right bronchial arteriogram was performed. With the use of a Fathom 14 micro  wire, a high-flow Renegade micro catheter was advanced to the distal aspect of the right bronchial artery. Selective right bronchial arteriogram was performed and the vessel was embolized with a half vial of 300-500 micron Embospheres. The micro catheter was retracted into the central aspect of the bronchial artery and post embolization selective bronchi arteriogram was performed. Images reviewed in the procedure was terminated. All wires, catheters and sheaths were removed from the patient. Hemostasis was achieved at the right groin access site with manual compression. A dressing was placed. The patient tolerated the procedure well without immediate postprocedural complication. FINDINGS: Flush aortogram demonstrates the origin of the right bronchial artery. Selective right bronchial arteriogram demonstrates patency of the vessel with potential mild vessel irregularity involving several right upper lobe bronchial artery is compatible with the area of bleeding seen on recent bronchoscopy. No definitive non pulmonary arterial supply was identified. Completion right bronchial arteriogram demonstrates patency of the main trunk of the bronchial artery with successful pruning of the distal vascular tree. IMPRESSION: Technically successful embolization of the right bronchial artery for recurrent intermittent hemoptysis. Electronically Signed   By: John  Watts M.D.   On: 08/28/2016 15:26   Dg Chest Port 1 View  Result Date: 08/24/2016 CLINICAL DATA:  54-year-old male with hemoptysis. EXAM: PORTABLE CHEST 1 VIEW COMPARISON:  Chest CT dated 08/22/2016 FINDINGS: A faint area of increased density noted at the right lung base which may represent normal pulmonary vasculature or correspond to the hazy and ground-glass density noted on the CT. The left lung is clear. There is no pleural effusion or pneumothorax. The cardiac silhouette is within normal limits with no acute osseous pathology. IMPRESSION: Normal pulmonary    vasculature versus airspace haziness at the right lung base. No focal consolidation. Electronically Signed   By: Anner Crete M.D.   On: 08/24/2016 05:59   Dg Chest Port 1 View  Result Date: 08/22/2016 CLINICAL DATA:  Hemoptysis. EXAM: PORTABLE CHEST 1 VIEW COMPARISON:  Radiographs and CT scan of August 20, 2016. FINDINGS: The heart size and mediastinal contours are within normal limits. Both lungs are clear. No pneumothorax or pleural effusion is noted. The visualized skeletal structures are unremarkable. IMPRESSION: No acute cardiopulmonary abnormality seen. Electronically Signed   By: Marijo Conception, M.D.   On: 08/22/2016 15:52   [place] Guide Roadmapping  Result Date: 08/28/2016 INDICATION: Recurrent hemoptysis.  Please perform bronchial artery embolization. EXAM: 1. ULTRASOUND GUIDANCE FOR ARTERIAL ACCESS. 2. FLUSH THORACIC AORTOGRAM 3. SELECTIVE RIGHT BRONCHIAL ARTERIOGRAM AND PERCUTANEOUS PARTICLE EMBOLIZATION MEDICATIONS: None ANESTHESIA/SEDATION: Moderate (conscious) sedation was employed during this procedure. A total of Versed 3 mg and Fentanyl 125 mcg was administered intravenously. Moderate Sedation Time: 45 minutes. The patient's level of consciousness and vital signs were monitored continuously by radiology nursing throughout the procedure under my direct supervision. CONTRAST:  65 cc Isovue-300 FLUOROSCOPY TIME:  Fluoroscopy Time: 12 minutes 54 seconds (606 mGy). COMPLICATIONS: None immediate. PROCEDURE: Informed consent was obtained from the patient following explanation of the procedure, risks, benefits and alternatives. The patient understands, agrees and consents for the procedure. All questions were addressed. A time out was performed prior to the initiation of the procedure. Maximal barrier sterile technique utilized including caps, mask, sterile gowns, sterile gloves, large sterile drape, hand hygiene, and Betadine prep. The right femoral head  was marked fluoroscopically. Under ultrasound guidance, the right common femoral artery was accessed with a micropuncture kit after the overlying soft tissues were anesthetized with 1% lidocaine. An ultrasound image was saved for documentation purposes. The micropuncture sheath was exchanged for a 5 [place] vascular sheath over a Bentson wire. A closure arteriogram was performed through the side of the sheath confirming access within the right common femoral artery. Over a Bentson wire, a flush pigtail catheter was advanced to the level of the thoracic aorta and a flush thoracic aortogram was performed. Over a Bentson wire, the pigtail catheter was exchanged for a Mickelson catheter was advanced to the level of the thoracic aorta where it was back bled and flushed. The catheter was then utilized to select the bronchial artery and a selective right bronchial arteriogram was performed. With the use of a Fathom 14 micro wire, a high-flow Renegade micro catheter was advanced to the distal aspect of the right bronchial artery. Selective right bronchial arteriogram was performed and the vessel was embolized with a half vial of 300-500 micron Embospheres. The micro catheter was retracted into the central aspect of the bronchial artery and post embolization selective bronchi arteriogram was performed. Images reviewed in the procedure was terminated. All wires, catheters and sheaths were removed from the patient. Hemostasis was achieved at the right groin access site with manual compression. A dressing was placed. The patient tolerated the procedure well without immediate postprocedural complication. FINDINGS: Flush aortogram demonstrates the origin of the right bronchial artery. Selective right bronchial arteriogram demonstrates patency of the vessel with potential mild vessel irregularity involving several right upper lobe bronchial artery is compatible with the area of bleeding seen on recent bronchoscopy. No definitive  non pulmonary arterial supply was identified. Completion right bronchial arteriogram demonstrates patency of the main trunk of the  bronchial artery with successful pruning of the distal vascular tree. IMPRESSION: Technically successful embolization of the right bronchial artery for recurrent intermittent hemoptysis. Electronically Signed   By: John  Watts M.D.   On: 08/28/2016 15:26       Subjective: No new complaints, no sob, chest pain.   Discharge Exam: Vitals:   08/29/16 0551 08/29/16 1010  BP: (!) 150/67 136/81  Pulse: (!) 58 72  Resp: 17   Temp: 97.8 F (36.6 C)    Vitals:   08/28/16 2000 08/28/16 2112 08/29/16 0551 08/29/16 1010  BP: 137/66 (!) 150/78 (!) 150/67 136/81  Pulse: 71 65 (!) 58 72  Resp: 17 18 17   Temp:  97.9 F (36.6 C) 97.8 F (36.6 C)   TempSrc:  Oral Oral   SpO2: 93% 95% 98%   Weight:  85.1 kg (187 lb 9.6 oz)    Height:  5' 7" (1.702 m)      General: Pt is alert, awake, not in acute distress Cardiovascular: RRR, S1/S2 +, no rubs, no gallops Respiratory: CTA bilaterally, no wheezing, no rhonchi Abdominal: Soft, NT, ND, bowel sounds + Extremities: no edema, no cyanosis    The results of significant diagnostics from this hospitalization (including imaging, microbiology, ancillary and laboratory) are listed below for reference.     Microbiology: Recent Results (from the past 240 hour(s))  Culture, respiratory (NON-Expectorated)     Status: None   Collection Time: 08/26/16  8:58 AM  Result Value Ref Range Status   Specimen Description BRONCHIAL ALVEOLAR LAVAGE  Final   Special Requests Normal  Final   Gram Stain   Final    FEW WBC PRESENT,BOTH PMN AND MONONUCLEAR NO ORGANISMS SEEN    Culture   Final    NO GROWTH 2 DAYS Performed at [facility]    Report Status 08/28/2016 FINAL  Final  Acid Fast Smear (AFB)     Status: None   Collection Time: 08/26/16  8:58 AM  Result Value Ref Range Status   AFB Specimen Processing Concentration   Final   Acid Fast Smear Negative  Final    Comment: (NOTE) Performed At: [facility] [place] [address] [place], [place] [id number] [place] William F MD Ph:[phone]    Source (AFB) BRONCHIAL ALVEOLAR LAVAGE  Final  Fungus Culture With Stain     Status: None (Preliminary result)   Collection Time: 08/26/16  8:58 AM  Result Value Ref Range Status   Fungus Stain Final report  Final    Comment: (NOTE) Performed At: [facility] [place] [address] [place], [place] [id number] [place] William F MD Ph:[phone]    Fungus (Mycology) Culture PENDING  Incomplete   Fungal Source BRONCHIAL ALVEOLAR LAVAGE  Final  Fungus Culture Result     Status: None   Collection Time: 08/26/16  8:58 AM  Result Value Ref Range Status   Result 1 Comment  Final    Comment: (NOTE) KOH/Calcofluor preparation:  no fungus observed. Performed At: [facility] [place] [address] [place], [place] [id number] [place] William F MD Ph:[phone]      Labs: BNP (last 3 results)  Recent Labs  08/20/16 1605  BNP 34.6   Basic Metabolic Panel:  Recent Labs Lab 08/28/16 0341 08/29/16 0934  NA 138 134*  K 4.6 4.4  CL 103 100*  CO2 28 26  GLUCOSE 165* 325*  BUN 26* 25*  CREATININE 1.15 1.23  CALCIUM 8.9 9.0   Liver Function Tests: No results for input(s): AST,   ALT, ALKPHOS, BILITOT, PROT, ALBUMIN in the last 168 hours. No results for input(s): LIPASE, AMYLASE in the last 168 hours. No results for input(s): AMMONIA in the last 168 hours. CBC:  Recent Labs Lab 08/26/16 0316 08/27/16 0330 08/28/16 0341 08/29/16 0934  WBC 5.4 5.5 5.0 5.2  NEUTROABS  --   --  3.3 4.0  HGB 11.3* 11.0* 10.5* 11.0*  HCT 35.1* 34.3* 32.5* 33.8*  MCV 83.8 84.1 84.4 83.9  PLT 272 336 372 428*   Cardiac Enzymes: No results for input(s): CKTOTAL, CKMB, CKMBINDEX, TROPONINI in the last 168 hours. BNP: Invalid input(s): POCBNP CBG:  Recent Labs Lab 08/28/16 1351 08/28/16 1749 08/28/16 2142  08/29/16 0735 08/29/16 1145  GLUCAP 154* 107* 282* 179* 238*   D-Dimer No results for input(s): DDIMER in the last 72 hours. Hgb A1c No results for input(s): HGBA1C in the last 72 hours. Lipid Profile No results for input(s): CHOL, HDL, LDLCALC, TRIG, CHOLHDL, LDLDIRECT in the last 72 hours. Thyroid function studies No results for input(s): TSH, T4TOTAL, T3FREE, THYROIDAB in the last 72 hours.  Invalid input(s): FREET3 Anemia work up No results for input(s): VITAMINB12, FOLATE, FERRITIN, TIBC, IRON, RETICCTPCT in the last 72 hours. Urinalysis    Component Value Date/Time   COLORURINE YELLOW 08/28/2011 0913   APPEARANCEUR CLEAR 08/28/2011 0913   LABSPEC 1.006 08/28/2011 0913   PHURINE 6.5 08/28/2011 0913   GLUCOSEU NEGATIVE 08/28/2011 0913   HGBUR NEGATIVE 08/28/2011 0913   BILIRUBINUR NEGATIVE 08/28/2011 0913   KETONESUR NEGATIVE 08/28/2011 0913   PROTEINUR NEGATIVE 08/28/2011 0913   UROBILINOGEN 0.2 08/28/2011 0913   NITRITE NEGATIVE 08/28/2011 0913   LEUKOCYTESUR NEGATIVE 08/28/2011 0913   [facility] Invalid input(s): PROCALCITONIN,  WBC,  LACTICIDVEN Microbiology Recent Results (from the past 240 hour(s))  Culture, respiratory (NON-Expectorated)     Status: None   Collection Time: 08/26/16  8:58 AM  Result Value Ref Range Status   Specimen Description BRONCHIAL ALVEOLAR LAVAGE  Final   Special Requests Normal  Final   Gram Stain   Final    FEW WBC PRESENT,BOTH PMN AND MONONUCLEAR NO ORGANISMS SEEN    Culture   Final    NO GROWTH 2 DAYS Performed at [facility]    Report Status 08/28/2016 FINAL  Final  Acid Fast Smear (AFB)     Status: None   Collection Time: 08/26/16  8:58 AM  Result Value Ref Range Status   AFB Specimen Processing Concentration  Final   Acid Fast Smear Negative  Final    Comment: (NOTE) Performed At: [facility] [address] [place], [place] [id number] H[place] William F MD Ph:[phone]    Source (AFB) BRONCHIAL  ALVEOLAR LAVAGE  Final  Fungus Culture With Stain     Status: None (Preliminary result)   Collection Time: 08/26/16  8:58 AM  Result Value Ref Range Status   Fungus Stain Final report  Final    Comment: (NOTE) Performed At: [facility] [address] [place], [place] [id number] H[place] William F MD Ph:[phone]    Fungus (Mycology) Culture PENDING  Incomplete   Fungal Source BRONCHIAL ALVEOLAR LAVAGE  Final  Fungus Culture Result     Status: None   Collection Time: 08/26/16  8:58 AM  Result Value Ref Range Status   Result 1 Comment  Final    Comment: (NOTE) KOH/Calcofluor preparation:  no fungus observed. Performed At: [facility] [address] [place], [place] [id number] H[place] William F MD Ph:[phone]        Time coordinating discharge: Over 30 minutes  SIGNED:   Hosie Poisson, MD  [facility] 09/01/2016, 9:25 PM Pager   If 7PM-7AM, please contact night-coverage [email or web address] Password [email or web address]

## 2016-09-01 NOTE — Telephone Encounter (Signed)
Pt left VM states just released from hospital.  He had an artery in his lung that was bleeding.  He says he was taken off his Anagrelide and his platelets are up to 428.  He is concerned and asks if he can see Dr. [name] soon to discuss Anagrelide?

## 2016-09-01 NOTE — Telephone Encounter (Signed)
Informed pt Dr. [name] can see him tomorrow at 11 am or 3 pm.  He will come at 2:45 pm tomorrow for 3 pm appt.Charles Bowman

## 2016-09-02 ENCOUNTER — Encounter: Payer: Self-pay | Admitting: Hematology and Oncology

## 2016-09-02 ENCOUNTER — Telehealth: Payer: Self-pay | Admitting: *Deleted

## 2016-09-02 ENCOUNTER — Ambulatory Visit (HOSPITAL_BASED_OUTPATIENT_CLINIC_OR_DEPARTMENT_OTHER): Payer: PRIVATE HEALTH INSURANCE

## 2016-09-02 ENCOUNTER — Ambulatory Visit (HOSPITAL_BASED_OUTPATIENT_CLINIC_OR_DEPARTMENT_OTHER): Payer: PRIVATE HEALTH INSURANCE | Admitting: Hematology and Oncology

## 2016-09-02 VITALS — BP 168/69 | HR 64 | Temp 97.5°F | Resp 18 | Ht 67.0 in | Wt 191.9 lb

## 2016-09-02 DIAGNOSIS — D473 Essential (hemorrhagic) thrombocythemia: Secondary | ICD-10-CM

## 2016-09-02 DIAGNOSIS — R042 Hemoptysis: Secondary | ICD-10-CM

## 2016-09-02 DIAGNOSIS — I10 Essential (primary) hypertension: Secondary | ICD-10-CM | POA: Diagnosis not present

## 2016-09-02 LAB — COMPREHENSIVE METABOLIC PANEL
ALBUMIN: 3.9 g/dL (ref 3.5–5.0)
ALK PHOS: 92 U/L (ref 40–150)
ALT: 36 U/L (ref 0–55)
ANION GAP: 11 meq/L (ref 3–11)
AST: 23 U/L (ref 5–34)
BILIRUBIN TOTAL: 0.4 mg/dL (ref 0.20–1.20)
BUN: 19.9 mg/dL (ref 7.0–26.0)
CALCIUM: 9.4 mg/dL (ref 8.4–10.4)
CO2: 24 mEq/L (ref 22–29)
Chloride: 103 mEq/L (ref 98–109)
Creatinine: 1 mg/dL (ref 0.7–1.3)
EGFR: 90 mL/min/{1.73_m2} — AB (ref >90)
GLUCOSE: 103 mg/dL (ref 70–140)
POTASSIUM: 4.2 meq/L (ref 3.5–5.1)
SODIUM: 138 meq/L (ref 136–145)
TOTAL PROTEIN: 7.5 g/dL (ref 6.4–8.3)

## 2016-09-02 LAB — CBC WITH DIFFERENTIAL/PLATELET
BASO%: 0.2 % (ref 0.0–2.0)
BASOS ABS: 0 10*3/uL (ref 0.0–0.1)
EOS ABS: 0.1 10*3/uL (ref 0.0–0.5)
EOS%: 1.2 % (ref 0.0–7.0)
HEMATOCRIT: 34 % — AB (ref 38.4–49.9)
HEMOGLOBIN: 11.2 g/dL — AB (ref 13.0–17.1)
LYMPH#: 1.3 10*3/uL (ref 0.9–3.3)
LYMPH%: 23 % (ref 14.0–49.0)
MCH: 27 pg — AB (ref 27.2–33.4)
MCHC: 32.9 g/dL (ref 32.0–36.0)
MCV: 81.9 fL (ref 79.3–98.0)
MONO#: 0.4 10*3/uL (ref 0.1–0.9)
MONO%: 7.4 % (ref 0.0–14.0)
NEUT#: 3.9 10*3/uL (ref 1.5–6.5)
NEUT%: 68.2 % (ref 39.0–75.0)
NRBC: 0 % (ref 0–0)
PLATELETS: 545 10*3/uL — AB (ref 140–400)
RBC: 4.15 10*6/uL — ABNORMAL LOW (ref 4.20–5.82)
RDW: 15.7 % — AB (ref 11.0–14.6)
WBC: 5.7 10*3/uL (ref 4.0–10.3)

## 2016-09-02 NOTE — Telephone Encounter (Signed)
-----   Message from Chesley Mires, MD sent at 08/29/2016  3:48 PM EDT ----- Renne Crigler to double book visit with me if needed.  V   ----- Message ----- From: Juanito Doom, MD Sent: 08/29/2016   1:41 PM To: Chesley Mires, MD, Virl Cagey, CMA  A, Can you arrange f/u with Dr. Halford Chessman or an NP in 2-4 weeks for hemoptysis?  He was just hospitalized.   Thanks B

## 2016-09-02 NOTE — Telephone Encounter (Signed)
Patient called.  Scheduled hosp follow up with [facility] on 11/14.

## 2016-09-02 NOTE — Assessment & Plan Note (Signed)
His blood pressure is high and poorly controlled We discussed the importance of getting his blood pressure under good control to reduce risk of bleeding complication He has follow-up with primary care doctor for medical management

## 2016-09-02 NOTE — Assessment & Plan Note (Signed)
This has resolved with recent embolization procedure He will follow closely with pulmonary service

## 2016-09-02 NOTE — Assessment & Plan Note (Signed)
I reassured the patient that anagrelide should not cause recent hemoptysis I recommend resuming treatment Due to high risk of bleeding, aspirin is stopped permanently

## 2016-09-02 NOTE — Telephone Encounter (Signed)
LMOM TCB x1 to schedule HFU with VS (ok to double book per him) or NP in 2-4 weeks Will route to myself and Ashtyn

## 2016-09-02 NOTE — Progress Notes (Signed)
[place] OFFICE PROGRESS NOTE  Patient Care Team: [name], DO as PCP - General (Family Medicine) Heath Lark, MD as Consulting Physician (Hematology and Oncology)  SUMMARY OF ONCOLOGIC HISTORY:   Essential thrombocythemia ([place])   11/03/1984 Cancer Diagnosis    He was noted to have significant abnormal CBC      05/24/2003 Bone Marrow Biopsy    Case #: [id number] BM biopsy confirmed essential thrombocythemia      11/06/2003 -  Chemotherapy    He was placed on anagrelide      08/22/2016 - 08/29/2016 Hospital Admission    The patient was admitted to the hospital due to hemoptysis. CT angiogram of the chest concerning for pneumonia. Bronchoscopy done revealed a blot clot was found in the posterior segment of the right upper lobe (B2). It was completely obstructing the airway.The clot was successfully removed, but the lesion began bleeding. BAL was performed in the RUL posterior segment (B2) of the lung and sent for bacterial, AFB and fungal analysis      08/28/2016 Procedure    He underwent selective right bronchial arteriogram was performed and the vessel was embolized        INTERVAL HISTORY: Please see below for problem oriented charting. He feels well. Aspirin and anagrelide were stopped. He denies further hemoptysis. Denies chest pain or shortness of breath. Cultures from recent BAL are pending  REVIEW OF SYSTEMS:   Constitutional: Denies fevers, chills or abnormal weight loss Eyes: Denies blurriness of vision Ears, nose, mouth, throat, and face: Denies mucositis or sore throat Respiratory: Denies cough, dyspnea or wheezes Cardiovascular: Denies palpitation, chest discomfort or lower extremity swelling Gastrointestinal:  Denies nausea, heartburn or change in bowel habits Skin: Denies abnormal skin rashes Lymphatics: Denies new lymphadenopathy or easy bruising Neurological:Denies numbness, tingling or new weaknesses Behavioral/Psych: Mood is stable, no  new changes  All other systems were reviewed with the patient and are negative.  I have reviewed the past medical history, past surgical history, social history and family history with the patient and they are unchanged from previous note.  ALLERGIES:  has No Known Allergies.  MEDICATIONS:  Current Outpatient Prescriptions  Medication Sig Dispense Refill  . anagrelide (AGRYLIN) 1 MG capsule Take 3 capsules (3 mg total) by mouth daily. 90 capsule 6  . atorvastatin (LIPITOR) 20 MG tablet Take 1 tablet (20 mg total) by mouth daily. NO MORE REFILLS WITHOUT OFFICE VISIT/ LABS - 2ND NOTICE 15 tablet 0  . fluticasone (FLONASE) 50 MCG/ACT nasal spray PLACE 2 SPRAYS IN EACH NOSTRIL EVERY DAY 16 g 1  . glimepiride (AMARYL) 4 MG tablet TAKE 1 TABLET (4 MG TOTAL) BY MOUTH DAILY BEFORE BREAKFAST.  "NO MORE REFILLS WITHOUT OV" (Patient taking differently: 4 mg. TAKE 1 TABLET (4 MG TOTAL) BY MOUTH DAILY BEFORE BREAKFAST.  "NO MORE REFILLS WITHOUT OV") 30 tablet 0  . glucose blood (ONE TOUCH ULTRA TEST) test strip Use as instructed 100 each 0  . hydrochlorothiazide (MICROZIDE) 12.5 MG capsule TAKE ONE CAPSULE BY MOUTH EVERY DAY 30 capsule 0  . Lancets (ONETOUCH ULTRASOFT) lancets Use as instructed 100 each 0  . lisinopril (PRINIVIL,ZESTRIL) 40 MG tablet Take 1 tablet (40 mg total) by mouth daily. 30 tablet 0  . LORazepam (ATIVAN) 1 MG tablet TAKE 1 TABLET BY MOUTH 3 TIMES A DAY AS NEEDED FOR ANXIETY 30 tablet 2  . MATZIM LA 420 MG 24 hr tablet TAKE 1 TABLET (420 MG TOTAL) BY MOUTH DAILY. 15 tablet  0  . metFORMIN (GLUCOPHAGE-XR) 500 MG 24 hr tablet TAKE 4 TABLETS BY MOUTH EVERY DAY  "OFFICE VISIT NEEDED" (Patient taking differently: Take 1,000 mg by mouth 2 (two) times daily. ) 60 tablet 0  . Multiple Vitamins-Minerals (MULTIVITAMIN WITH MINERALS) tablet Take 1 tablet by mouth daily.    Marland Kitchen SYNTHROID 175 MCG tablet TAKE 1 TABLET (175 MCG TOTAL) BY MOUTH DAILY  "NO MORE REFILLS WITHOUT OFFICE VISIT" 15 tablet 0    No current facility-administered medications for this visit.     PHYSICAL EXAMINATION: ECOG PERFORMANCE STATUS: 0 - Asymptomatic  Vitals:   09/02/16 1452  BP: (!) 168/69  Pulse: 64  Resp: 18  Temp: 97.5 F (36.4 C)   Filed Weights   09/02/16 1452  Weight: 191 lb 14.4 oz (87 kg)    GENERAL:alert, no distress and comfortable SKIN: skin color, texture, turgor are normal, no rashes or significant lesions EYES: normal, Conjunctiva are pink and non-injected, sclera clear OROPHARYNX:no exudate, no erythema and lips, buccal mucosa, and tongue normal  NECK: supple, thyroid normal size, non-tender, without nodularity LYMPH:  no palpable lymphadenopathy in the cervical, axillary or inguinal LUNGS: clear to auscultation and percussion with normal breathing effort HEART: regular rate & rhythm and no murmurs and no lower extremity edema ABDOMEN:abdomen soft, non-tender and normal bowel sounds Musculoskeletal:no cyanosis of digits and no clubbing  NEURO: alert & oriented x 3 with fluent speech, no focal motor/sensory deficits  LABORATORY DATA:  I have reviewed the data as listed    Component Value Date/Time   NA 138 09/02/2016 1527   K 4.2 09/02/2016 1527   CL 100 (L) 08/29/2016 0934   CL 105 09/17/2012 1030   CO2 24 09/02/2016 1527   GLUCOSE 103 09/02/2016 1527   GLUCOSE 140 (H) 09/17/2012 1030   BUN 19.9 09/02/2016 1527   CREATININE 1.0 09/02/2016 1527   CALCIUM 9.4 09/02/2016 1527   PROT 7.5 09/02/2016 1527   ALBUMIN 3.9 09/02/2016 1527   AST 23 09/02/2016 1527   ALT 36 09/02/2016 1527   ALKPHOS 92 09/02/2016 1527   BILITOT 0.40 09/02/2016 1527   GFRNONAA >60 08/29/2016 0934   GFRAA >60 08/29/2016 0934    No results found for: SPEP, UPEP  Lab Results  Component Value Date   WBC 5.7 09/02/2016   NEUTROABS 3.9 09/02/2016   HGB 11.2 (L) 09/02/2016   HCT 34.0 (L) 09/02/2016   MCV 81.9 09/02/2016   PLT 545 (H) 09/02/2016      Chemistry      Component Value  Date/Time   NA 138 09/02/2016 1527   K 4.2 09/02/2016 1527   CL 100 (L) 08/29/2016 0934   CL 105 09/17/2012 1030   CO2 24 09/02/2016 1527   BUN 19.9 09/02/2016 1527   CREATININE 1.0 09/02/2016 1527      Component Value Date/Time   CALCIUM 9.4 09/02/2016 1527   ALKPHOS 92 09/02/2016 1527   AST 23 09/02/2016 1527   ALT 36 09/02/2016 1527   BILITOT 0.40 09/02/2016 1527       RADIOGRAPHIC STUDIES:I reviewed recent CT I have personally reviewed the radiological images as listed and agreed with the findings in the report.    ASSESSMENT & PLAN:  Essential thrombocythemia ([place]) I reassured the patient that anagrelide should not cause recent hemoptysis I recommend resuming treatment Due to high risk of bleeding, aspirin is stopped permanently  Hemoptysis This has resolved with recent embolization procedure He will follow closely with pulmonary service  Essential hypertension His blood pressure is high and poorly controlled We discussed the importance of getting his blood pressure under good control to reduce risk of bleeding complication He has follow-up with primary care doctor for medical management   Orders Placed This Encounter  Procedures  . Comprehensive metabolic panel    Standing Status:   Future    Standing Expiration Date:   10/07/2017   All questions were answered. The patient knows to call the clinic with any problems, questions or concerns. No barriers to learning was detected. I spent 15 minutes counseling the patient face to face. The total time spent in the appointment was 20 minutes and more than 50% was on counseling and review of test results     Heath Lark, MD 09/03/2016 1:12 PM

## 2016-09-16 ENCOUNTER — Encounter: Payer: Self-pay | Admitting: Adult Health

## 2016-09-16 ENCOUNTER — Ambulatory Visit (INDEPENDENT_AMBULATORY_CARE_PROVIDER_SITE_OTHER): Payer: PRIVATE HEALTH INSURANCE | Admitting: Adult Health

## 2016-09-16 DIAGNOSIS — R042 Hemoptysis: Secondary | ICD-10-CM | POA: Diagnosis not present

## 2016-09-16 DIAGNOSIS — Z23 Encounter for immunization: Secondary | ICD-10-CM

## 2016-09-16 NOTE — Patient Instructions (Signed)
Flu shot  Continue on current regimen  Call back if bleeding reoccurs.  Follow up with Dr. Lake Bells in 3 months and As needed   Please contact office for sooner follow up if symptoms do not improve or worsen or seek emergency care

## 2016-09-16 NOTE — Progress Notes (Signed)
Subjective:    Patient ID: Charles Bowman, male    DOB: 1961/05/06, 55 y.o.   MRN: [id number]  HPI  55 year old male former smoker with a past medical history significant for essential thrombocythemia on anagrelide seen for pulmonary consult for hemoptysis during hospitalization 08/2016 . He underwent a bronchoscopy on October 24 that revealed a blood clot in the posterior segment of the right upper lobe, completely obstructing the airway. Clot was removed successfully. However the lesion began bleeding and required bronchial artery embolization.  BAL was neg. AFB and Fungal cx were ngtd.  He was anagrelide , ASA and Goody powder prior to admission. This was held at discharge. Was seen by hematology after discharge and Anagrelide was restarted. ASA and good powder were stopped totally.  Had a similar episode in 2009 .  Since discharge he is feeling better. No further bleeding .  Would like to get flu shot today  Denies chest pain, orthopnea or edema.      Past Medical History:  Diagnosis Date  . Anemia   . Anxiety 05/11/2014  . Cancer ([place])   . Diabetes mellitus without complication ([place])   . Hypertension   . Murmur 11/09/2014  . Thyroid disease    Current Outpatient Prescriptions on File Prior to Visit  Medication Sig Dispense Refill  . anagrelide (AGRYLIN) 1 MG capsule Take 3 capsules (3 mg total) by mouth daily. 90 capsule 6  . atorvastatin (LIPITOR) 20 MG tablet Take 1 tablet (20 mg total) by mouth daily. NO MORE REFILLS WITHOUT OFFICE VISIT/ LABS - 2ND NOTICE 15 tablet 0  . fluticasone (FLONASE) 50 MCG/ACT nasal spray PLACE 2 SPRAYS IN EACH NOSTRIL EVERY DAY 16 g 1  . glimepiride (AMARYL) 4 MG tablet TAKE 1 TABLET (4 MG TOTAL) BY MOUTH DAILY BEFORE BREAKFAST.  "NO MORE REFILLS WITHOUT OV" (Patient taking differently: 4 mg. TAKE 1 TABLET (4 MG TOTAL) BY MOUTH DAILY BEFORE BREAKFAST.  "NO MORE REFILLS WITHOUT OV") 30 tablet 0  . glucose blood (ONE TOUCH ULTRA TEST) test strip Use as  instructed 100 each 0  . hydrochlorothiazide (MICROZIDE) 12.5 MG capsule TAKE ONE CAPSULE BY MOUTH EVERY DAY 30 capsule 0  . Lancets (ONETOUCH ULTRASOFT) lancets Use as instructed 100 each 0  . lisinopril (PRINIVIL,ZESTRIL) 40 MG tablet Take 1 tablet (40 mg total) by mouth daily. 30 tablet 0  . LORazepam (ATIVAN) 1 MG tablet TAKE 1 TABLET BY MOUTH 3 TIMES A DAY AS NEEDED FOR ANXIETY 30 tablet 2  . MATZIM LA 420 MG 24 hr tablet TAKE 1 TABLET (420 MG TOTAL) BY MOUTH DAILY. 15 tablet 0  . metFORMIN (GLUCOPHAGE-XR) 500 MG 24 hr tablet TAKE 4 TABLETS BY MOUTH EVERY DAY  "OFFICE VISIT NEEDED" (Patient taking differently: Take 1,000 mg by mouth 2 (two) times daily. ) 60 tablet 0  . Multiple Vitamins-Minerals (MULTIVITAMIN WITH MINERALS) tablet Take 1 tablet by mouth daily.    Marland Kitchen SYNTHROID 175 MCG tablet TAKE 1 TABLET (175 MCG TOTAL) BY MOUTH DAILY  "NO MORE REFILLS WITHOUT OFFICE VISIT" 15 tablet 0   No current facility-administered medications on file prior to visit.        Review of Systems Constitutional:   No  weight loss, night sweats,  Fevers, chills, fatigue, or  lassitude.  HEENT:   No headaches,  Difficulty swallowing,  Tooth/dental problems, or  Sore throat,                No sneezing, itching,  ear ache, nasal congestion, post nasal drip,   CV:  No chest pain,  Orthopnea, PND, swelling in lower extremities, anasarca, dizziness, palpitations, syncope.   GI  No heartburn, indigestion, abdominal pain, nausea, vomiting, diarrhea, change in bowel habits, loss of appetite, bloody stools.   Resp: No shortness of breath with exertion or at rest.  No excess mucus, no productive cough,  No non-productive cough,  No coughing up of blood.  No change in color of mucus.  No wheezing.  No chest wall deformity  Skin: no rash or lesions.  GU: no dysuria, change in color of urine, no urgency or frequency.  No flank pain, no hematuria   MS:  No joint pain or swelling.  No decreased range of motion.   No back pain.  Psych:  No change in mood or affect. No depression or anxiety.  No memory loss.         Objective:   Physical Exam Vitals:   09/16/16 1556  BP: 118/60  Pulse: 68  Temp: 97.5 F (36.4 C)  TempSrc: Oral  SpO2: 98%  Weight: 197 lb 3.2 oz (89.4 kg)  Height: 5\' 7"  (1.702 m)   GEN: A/Ox3; pleasant , NAD, well nourished    HEENT:  [place]/AT,  EACs-clear, TMs-wnl, NOSE-clear, THROAT-clear, no lesions, no postnasal drip or exudate noted.   NECK:  Supple w/ fair ROM; no JVD; normal carotid impulses w/o bruits; no thyromegaly or nodules palpated; no lymphadenopathy.    RESP  Clear  P & A; w/o, wheezes/ rales/ or rhonchi. no accessory muscle use, no dullness to percussion  CARD:  RRR, no m/r/g  , no peripheral edema, pulses intact, no cyanosis or clubbing.  GI:   Soft & nt; nml bowel sounds; no organomegaly or masses detected.   Musco: Warm bil, no deformities or joint swelling noted.   Neuro: alert, no focal deficits noted.    Skin: Warm, no lesions or rashes  Tammy Parrett NP-C  [facility]  09/16/2016

## 2016-09-22 NOTE — Assessment & Plan Note (Signed)
Resolved with no recurrence .   Plan  Patient Instructions  Flu shot  Continue on current regimen  Call back if bleeding reoccurs.  Follow up with Dr. Lake Bells in 3 months and As needed   Please contact office for sooner follow up if symptoms do not improve or worsen or seek emergency care

## 2016-09-23 LAB — FUNGUS CULTURE WITH STAIN

## 2016-09-23 LAB — FUNGUS CULTURE RESULT

## 2016-09-23 LAB — FUNGAL ORGANISM REFLEX

## 2016-09-29 MED FILL — LISINOPRIL 40 MG TABLET: 40 | 30 days supply | Qty: 30 | Fill #0

## 2016-09-29 MED FILL — ANAGRELIDE HCL 1 MG CAPSULE: 1 | 30 days supply | Qty: 90 | Fill #1

## 2016-10-09 LAB — ACID FAST CULTURE WITH REFLEXED SENSITIVITIES: ACID FAST CULTURE - AFSCU3: NEGATIVE

## 2016-10-29 MED FILL — ANAGRELIDE HCL 1 MG CAPSULE: 1 | 30 days supply | Qty: 90 | Fill #2

## 2016-11-21 ENCOUNTER — Other Ambulatory Visit: Payer: PRIVATE HEALTH INSURANCE

## 2016-11-21 ENCOUNTER — Ambulatory Visit: Payer: PRIVATE HEALTH INSURANCE | Admitting: Hematology and Oncology

## 2016-11-24 ENCOUNTER — Telehealth: Payer: Self-pay | Admitting: Hematology and Oncology

## 2016-11-24 NOTE — Telephone Encounter (Signed)
Left message re 2/13 f/u. Schedule mailed.

## 2016-11-24 NOTE — Telephone Encounter (Signed)
Left message re 2/13 f/u schedule mailed.

## 2016-11-27 MED FILL — ANAGRELIDE HCL 1 MG CAPSULE: 1 | 30 days supply | Qty: 90 | Fill #3

## 2016-12-16 ENCOUNTER — Telehealth: Payer: Self-pay

## 2016-12-16 ENCOUNTER — Ambulatory Visit (HOSPITAL_BASED_OUTPATIENT_CLINIC_OR_DEPARTMENT_OTHER): Payer: 59

## 2016-12-16 ENCOUNTER — Ambulatory Visit (HOSPITAL_BASED_OUTPATIENT_CLINIC_OR_DEPARTMENT_OTHER): Payer: 59 | Admitting: Hematology and Oncology

## 2016-12-16 VITALS — BP 164/78 | HR 83 | Temp 98.1°F | Resp 20 | Ht 67.0 in | Wt 195.4 lb

## 2016-12-16 DIAGNOSIS — I10 Essential (primary) hypertension: Secondary | ICD-10-CM

## 2016-12-16 DIAGNOSIS — E119 Type 2 diabetes mellitus without complications: Secondary | ICD-10-CM

## 2016-12-16 DIAGNOSIS — D473 Essential (hemorrhagic) thrombocythemia: Secondary | ICD-10-CM

## 2016-12-16 LAB — COMPREHENSIVE METABOLIC PANEL
ALBUMIN: 4 g/dL (ref 3.5–5.0)
ALT: 39 U/L (ref 0–55)
AST: 24 U/L (ref 5–34)
Alkaline Phosphatase: 118 U/L (ref 40–150)
Anion Gap: 10 mEq/L (ref 3–11)
BUN: 14.1 mg/dL (ref 7.0–26.0)
CALCIUM: 9.9 mg/dL (ref 8.4–10.4)
CHLORIDE: 103 meq/L (ref 98–109)
CO2: 24 mEq/L (ref 22–29)
CREATININE: 0.9 mg/dL (ref 0.7–1.3)
EGFR: >90 mL/min/{1.73_m2} (ref >90)
Glucose: 154 mg/dl — ABNORMAL HIGH (ref 70–140)
Potassium: 4.1 mEq/L (ref 3.5–5.1)
Sodium: 137 mEq/L (ref 136–145)
Total Bilirubin: 0.36 mg/dL (ref 0.20–1.20)
Total Protein: 7.3 g/dL (ref 6.4–8.3)

## 2016-12-16 LAB — CBC WITH DIFFERENTIAL/PLATELET
BASO%: 0.2 % (ref 0.0–2.0)
Basophils Absolute: 0 10*3/uL (ref 0.0–0.1)
EOS%: 2 % (ref 0.0–7.0)
Eosinophils Absolute: 0.1 10*3/uL (ref 0.0–0.5)
HEMATOCRIT: 39.8 % (ref 38.4–49.9)
HEMOGLOBIN: 13 g/dL (ref 13.0–17.1)
LYMPH#: 0.9 10*3/uL (ref 0.9–3.3)
LYMPH%: 19.3 % (ref 14.0–49.0)
MCH: 25.6 pg — ABNORMAL LOW (ref 27.2–33.4)
MCHC: 32.7 g/dL (ref 32.0–36.0)
MCV: 78.3 fL — ABNORMAL LOW (ref 79.3–98.0)
MONO#: 0.3 10*3/uL (ref 0.1–0.9)
MONO%: 6.4 % (ref 0.0–14.0)
NEUT#: 3.3 10*3/uL (ref 1.5–6.5)
NEUT%: 72.1 % (ref 39.0–75.0)
Platelets: 183 10*3/uL (ref 140–400)
RBC: 5.08 10*6/uL (ref 4.20–5.82)
RDW: 15.7 % — ABNORMAL HIGH (ref 11.0–14.6)
WBC: 4.5 10*3/uL (ref 4.0–10.3)

## 2016-12-16 NOTE — Telephone Encounter (Signed)
-----   Message from Heath Lark, MD sent at 12/16/2016  2:36 PM EST ----- Regarding: all labs are good Pls let him know ----- Message ----- From: Interface, Lab In Three Zero One Sent: 12/16/2016   1:38 PM To: Heath Lark, MD

## 2016-12-16 NOTE — Telephone Encounter (Signed)
Left message with below message.

## 2016-12-17 ENCOUNTER — Encounter: Payer: Self-pay | Admitting: Hematology and Oncology

## 2016-12-17 NOTE — Assessment & Plan Note (Signed)
He tolerated Anagrelide well I recommend treatment indefinitely Due to high risk of bleeding, aspirin is stopped permanently 

## 2016-12-17 NOTE — Assessment & Plan Note (Signed)
He has significant cardiac risk factors including diabetes, obesity and hypertension We discussed importance of life style modification and exercise He is currently enrolled in a comprehensive exercise program

## 2016-12-17 NOTE — Progress Notes (Signed)
[facility] OFFICE PROGRESS NOTE  Patient Care Team: [name], DO as PCP - General (Family Medicine) Heath Lark, MD as Consulting Physician (Hematology and Oncology)  SUMMARY OF ONCOLOGIC HISTORY:   Essential thrombocythemia ([place])   11/03/1984 Cancer Diagnosis    He was noted to have significant abnormal CBC      05/24/2003 Bone Marrow Biopsy    Case #: [id number] BM biopsy confirmed essential thrombocythemia      11/06/2003 -  Chemotherapy    He was placed on anagrelide      08/22/2016 - 08/29/2016 Hospital Admission    The patient was admitted to the hospital due to hemoptysis. CT angiogram of the chest concerning for pneumonia. Bronchoscopy done revealed a blot clot was found in the posterior segment of the right upper lobe (B2). It was completely obstructing the airway.The clot was successfully removed, but the lesion began bleeding. BAL was performed in the RUL posterior segment (B2) of the lung and sent for bacterial, AFB and fungal analysis      08/28/2016 Procedure    He underwent selective right bronchial arteriogram was performed and the vessel was embolized        INTERVAL HISTORY: Please see below for problem oriented charting. He returns for follow-up  He feels well The patient denies any recent signs or symptoms of bleeding such as spontaneous epistaxis, hematuria or hematochezia. He is exercising regularly  REVIEW OF SYSTEMS:   Constitutional: Denies fevers, chills or abnormal weight loss Eyes: Denies blurriness of vision Ears, nose, mouth, throat, and face: Denies mucositis or sore throat Respiratory: Denies cough, dyspnea or wheezes Cardiovascular: Denies palpitation, chest discomfort or lower extremity swelling Gastrointestinal:  Denies nausea, heartburn or change in bowel habits Skin: Denies abnormal skin rashes Lymphatics: Denies new lymphadenopathy or easy bruising Neurological:Denies numbness, tingling or new  weaknesses Behavioral/Psych: Mood is stable, no new changes  All other systems were reviewed with the patient and are negative.  I have reviewed the past medical history, past surgical history, social history and family history with the patient and they are unchanged from previous note.  ALLERGIES:  has No Known Allergies.  MEDICATIONS:  Current Outpatient Prescriptions  Medication Sig Dispense Refill  . anagrelide (AGRYLIN) 1 MG capsule Take 3 capsules (3 mg total) by mouth daily. 90 capsule 6  . atorvastatin (LIPITOR) 20 MG tablet Take 1 tablet (20 mg total) by mouth daily. NO MORE REFILLS WITHOUT OFFICE VISIT/ LABS - 2ND NOTICE 15 tablet 0  . fluticasone (FLONASE) 50 MCG/ACT nasal spray PLACE 2 SPRAYS IN EACH NOSTRIL EVERY DAY 16 g 1  . glimepiride (AMARYL) 4 MG tablet TAKE 1 TABLET (4 MG TOTAL) BY MOUTH DAILY BEFORE BREAKFAST.  "NO MORE REFILLS WITHOUT OV" (Patient taking differently: 4 mg. TAKE 1 TABLET (4 MG TOTAL) BY MOUTH DAILY BEFORE BREAKFAST.  "NO MORE REFILLS WITHOUT OV") 30 tablet 0  . glucose blood (ONE TOUCH ULTRA TEST) test strip Use as instructed 100 each 0  . hydrochlorothiazide (MICROZIDE) 12.5 MG capsule TAKE ONE CAPSULE BY MOUTH EVERY DAY 30 capsule 0  . Lancets (ONETOUCH ULTRASOFT) lancets Use as instructed 100 each 0  . lisinopril (PRINIVIL,ZESTRIL) 40 MG tablet Take 1 tablet (40 mg total) by mouth daily. 30 tablet 0  . LORazepam (ATIVAN) 1 MG tablet TAKE 1 TABLET BY MOUTH 3 TIMES A DAY AS NEEDED FOR ANXIETY 30 tablet 2  . MATZIM LA 420 MG 24 hr tablet TAKE 1 TABLET (420 MG TOTAL) BY  MOUTH DAILY. 15 tablet 0  . metFORMIN (GLUCOPHAGE-XR) 500 MG 24 hr tablet TAKE 4 TABLETS BY MOUTH EVERY DAY  "OFFICE VISIT NEEDED" (Patient taking differently: Take 1,000 mg by mouth 2 (two) times daily. ) 60 tablet 0  . Multiple Vitamins-Minerals (MULTIVITAMIN WITH MINERALS) tablet Take 1 tablet by mouth daily.    Marland Kitchen SYNTHROID 175 MCG tablet TAKE 1 TABLET (175 MCG TOTAL) BY MOUTH DAILY  "NO  MORE REFILLS WITHOUT OFFICE VISIT" 15 tablet 0   No current facility-administered medications for this visit.     PHYSICAL EXAMINATION: ECOG PERFORMANCE STATUS: 0 - Asymptomatic  Vitals:   12/16/16 1305 12/16/16 1306  BP: (!) 172/79 (!) 164/78  Pulse: 83   Resp: 20   Temp: 98.1 F (36.7 C)    Filed Weights   12/16/16 1305  Weight: 195 lb 6.4 oz (88.6 kg)    GENERAL:alert, no distress and comfortable. He is moderately obese SKIN: skin color, texture, turgor are normal, no rashes or significant lesions EYES: normal, Conjunctiva are pink and non-injected, sclera clear OROPHARYNX:no exudate, no erythema and lips, buccal mucosa, and tongue normal  NECK: supple, thyroid normal size, non-tender, without nodularity LYMPH:  no palpable lymphadenopathy in the cervical, axillary or inguinal LUNGS: clear to auscultation and percussion with normal breathing effort HEART: regular rate & rhythm and no murmurs and no lower extremity edema ABDOMEN:abdomen soft, non-tender and normal bowel sounds Musculoskeletal:no cyanosis of digits and no clubbing  NEURO: alert & oriented x 3 with fluent speech, no focal motor/sensory deficits  LABORATORY DATA:  I have reviewed the data as listed    Component Value Date/Time   NA 137 12/16/2016 1328   K 4.1 12/16/2016 1328   CL 100 (L) 08/29/2016 0934   CL 105 09/17/2012 1030   CO2 24 12/16/2016 1328   GLUCOSE 154 (H) 12/16/2016 1328   GLUCOSE 140 (H) 09/17/2012 1030   BUN 14.1 12/16/2016 1328   CREATININE 0.9 12/16/2016 1328   CALCIUM 9.9 12/16/2016 1328   PROT 7.3 12/16/2016 1328   ALBUMIN 4.0 12/16/2016 1328   AST 24 12/16/2016 1328   ALT 39 12/16/2016 1328   ALKPHOS 118 12/16/2016 1328   BILITOT 0.36 12/16/2016 1328   GFRNONAA >60 08/29/2016 0934   GFRAA >60 08/29/2016 0934    No results found for: SPEP, UPEP  Lab Results  Component Value Date   WBC 4.5 12/16/2016   NEUTROABS 3.3 12/16/2016   HGB 13.0 12/16/2016   HCT 39.8  12/16/2016   MCV 78.3 (L) 12/16/2016   PLT 183 12/16/2016      Chemistry      Component Value Date/Time   NA 137 12/16/2016 1328   K 4.1 12/16/2016 1328   CL 100 (L) 08/29/2016 0934   CL 105 09/17/2012 1030   CO2 24 12/16/2016 1328   BUN 14.1 12/16/2016 1328   CREATININE 0.9 12/16/2016 1328      Component Value Date/Time   CALCIUM 9.9 12/16/2016 1328   ALKPHOS 118 12/16/2016 1328   AST 24 12/16/2016 1328   ALT 39 12/16/2016 1328   BILITOT 0.36 12/16/2016 1328      ASSESSMENT & PLAN:  Essential thrombocythemia ([place]) He tolerated Anagrelide well I recommend treatment indefinitely Due to high risk of bleeding, aspirin is stopped permanently  Essential hypertension His blood pressure is high and poorly controlled We discussed the importance of getting his blood pressure under good control  He has follow-up with primary care doctor for medical management According  to him, there is an element of white coat hypertension  Diabetes mellitus type 2, uncomplicated ([place]) He has significant cardiac risk factors including diabetes, obesity and hypertension We discussed importance of life style modification and exercise He is currently enrolled in a comprehensive exercise program   No orders of the defined types were placed in this encounter.  All questions were answered. The patient knows to call the clinic with any problems, questions or concerns. No barriers to learning was detected. I spent 15 minutes counseling the patient face to face. The total time spent in the appointment was 20 minutes and more than 50% was on counseling and review of test results     Heath Lark, MD 12/17/2016 4:46 AM

## 2016-12-17 NOTE — Assessment & Plan Note (Signed)
His blood pressure is high and poorly controlled We discussed the importance of getting his blood pressure under good control  He has follow-up with primary care doctor for medical management According to him, there is an element of white coat hypertension

## 2016-12-19 ENCOUNTER — Ambulatory Visit (INDEPENDENT_AMBULATORY_CARE_PROVIDER_SITE_OTHER)
Admission: RE | Admit: 2016-12-19 | Discharge: 2016-12-19 | Disposition: A | Discharge status: Home / Self Care | Payer: 59 | Source: Ambulatory Visit | Attending: Pulmonary Disease | Admitting: Pulmonary Disease

## 2016-12-19 ENCOUNTER — Ambulatory Visit (INDEPENDENT_AMBULATORY_CARE_PROVIDER_SITE_OTHER): Payer: 59 | Admitting: Pulmonary Disease

## 2016-12-19 ENCOUNTER — Encounter: Payer: Self-pay | Admitting: Pulmonary Disease

## 2016-12-19 VITALS — BP 144/68 | HR 69 | Ht 67.0 in | Wt 194.0 lb

## 2016-12-19 DIAGNOSIS — R042 Hemoptysis: Secondary | ICD-10-CM

## 2016-12-19 DIAGNOSIS — J432 Centrilobular emphysema: Secondary | ICD-10-CM

## 2016-12-19 HISTORY — DX: Centrilobular emphysema: J43.2

## 2016-12-19 NOTE — Progress Notes (Signed)
Subjective:    Patient ID: Charles Bowman, male    DOB: [birth year], 56 y.o.   MRN: [id number]  Synopsis: Evaluated for the second time by [facility] in 2017 for hemoptysis in the setting of right upper lobe pneumonia. Treated with antibiotics but continued to have hemoptysis. Ultimately required embolization of the right upper lobe vasculature.  HPI Chief Complaint  Patient presents with  . Follow-up    pt doing well, no complaints today.     Charles Bowman has not coughed up any blood since the last visit.  He has had a cough occasionally with a head cold, but he has not had any hemoptysis.  He went back on his anegrilide and has been doing well on that.  He has not had any breathing difficulty since his hospitalization.   Past Medical History:  Diagnosis Date  . Anemia   . Anxiety 05/11/2014  . Cancer ([place])   . Centrilobular emphysema ([place]) 12/19/2016  . Diabetes mellitus without complication ([place])   . Hypertension   . Murmur 11/09/2014  . Thyroid disease       Review of Systems     Objective:   Physical Exam Vitals:   12/19/16 1640  BP: (!) 144/68  Pulse: 69  SpO2: 96%  Weight: 194 lb (88 kg)  Height: 5\' 7"  (1.702 m)    Gen: well appearing HENT: OP clear, TM's clear, neck supple PULM: CTA B, normal percussion CV: RRR, no mgr, trace edema GI: BS+, soft, nontender Derm: no cyanosis or rash Psyche: normal mood and affect        Assessment & Plan:  Hemoptysis Charles Bowman had his second lifetime episode of hemoptysis with right upper lobe pneumonia in 2017 but fortunately he has not had recurrence of symptoms since his embolization procedure.  Plan: Given CAP diagnosis last year will repeat CXR F/u 6 months  Centrilobular emphysema ([place]) He had mild centrilobular emphysema seen on his CT in 08/2016.  This is undoubtedly related to his heavy smoking history. He smoked 1-1/2 packs of cigarettes daily for 35 years and quit in 2009. However, given his lack of  shortness of breath or other respiratory symptoms there is no indication for further testing at this time. I advised him today to remain abstinent from cigarettes.    Current Outpatient Prescriptions:  .  anagrelide (AGRYLIN) 1 MG capsule, Take 3 capsules (3 mg total) by mouth daily., Disp: 90 capsule, Rfl: 6 .  atorvastatin (LIPITOR) 20 MG tablet, Take 1 tablet (20 mg total) by mouth daily. NO MORE REFILLS WITHOUT OFFICE VISIT/ LABS - 2ND NOTICE, Disp: 15 tablet, Rfl: 0 .  fluticasone (FLONASE) 50 MCG/ACT nasal spray, PLACE 2 SPRAYS IN EACH NOSTRIL EVERY DAY, Disp: 16 g, Rfl: 1 .  glimepiride (AMARYL) 4 MG tablet, TAKE 1 TABLET (4 MG TOTAL) BY MOUTH DAILY BEFORE BREAKFAST.  "NO MORE REFILLS WITHOUT OV" (Patient taking differently: 4 mg. TAKE 1 TABLET (4 MG TOTAL) BY MOUTH DAILY BEFORE BREAKFAST.  "NO MORE REFILLS WITHOUT OV"), Disp: 30 tablet, Rfl: 0 .  glucose blood (ONE TOUCH ULTRA TEST) test strip, Use as instructed, Disp: 100 each, Rfl: 0 .  hydrochlorothiazide (MICROZIDE) 12.5 MG capsule, TAKE ONE CAPSULE BY MOUTH EVERY DAY, Disp: 30 capsule, Rfl: 0 .  Lancets (ONETOUCH ULTRASOFT) lancets, Use as instructed, Disp: 100 each, Rfl: 0 .  lisinopril (PRINIVIL,ZESTRIL) 40 MG tablet, Take 1 tablet (40 mg total) by mouth daily., Disp: 30 tablet, Rfl: 0 .  LORazepam (  ATIVAN) 1 MG tablet, TAKE 1 TABLET BY MOUTH 3 TIMES A DAY AS NEEDED FOR ANXIETY, Disp: 30 tablet, Rfl: 2 .  MATZIM LA 420 MG 24 hr tablet, TAKE 1 TABLET (420 MG TOTAL) BY MOUTH DAILY., Disp: 15 tablet, Rfl: 0 .  metFORMIN (GLUCOPHAGE-XR) 500 MG 24 hr tablet, TAKE 4 TABLETS BY MOUTH EVERY DAY  "OFFICE VISIT NEEDED" (Patient taking differently: Take 1,000 mg by mouth 2 (two) times daily. ), Disp: 60 tablet, Rfl: 0 .  Multiple Vitamins-Minerals (MULTIVITAMIN WITH MINERALS) tablet, Take 1 tablet by mouth daily., Disp: , Rfl:  .  SYNTHROID 175 MCG tablet, TAKE 1 TABLET (175 MCG TOTAL) BY MOUTH DAILY  "NO MORE REFILLS WITHOUT OFFICE VISIT"  (Patient taking differently: 212 mcg. TAKE 1 TABLET (175 MCG TOTAL) BY MOUTH DAILY  "NO MORE REFILLS WITHOUT OFFICE VISIT"), Disp: 15 tablet, Rfl: 0

## 2016-12-19 NOTE — Assessment & Plan Note (Signed)
Mr. Diab had his second lifetime episode of hemoptysis with right upper lobe pneumonia in 2017 but fortunately he has not had recurrence of symptoms since his embolization procedure.  Plan: Given CAP diagnosis last year will repeat CXR F/u 6 months

## 2016-12-19 NOTE — Patient Instructions (Signed)
We will call you with the results of today's chest x-ray Let us know if you have any problems coughing up blood Otherwise we will see you back in 6 months

## 2016-12-19 NOTE — Assessment & Plan Note (Signed)
He had mild centrilobular emphysema seen on his CT in 08/2016.  This is undoubtedly related to his heavy smoking history. He smoked 1-1/2 packs of cigarettes daily for 35 years and quit in 2009. However, given his lack of shortness of breath or other respiratory symptoms there is no indication for further testing at this time. I advised him today to remain abstinent from cigarettes.

## 2016-12-22 NOTE — Progress Notes (Signed)
Left voicemail for patient to contact office for medical results.

## 2016-12-29 MED FILL — ANAGRELIDE HCL 1 MG CAPSULE: 1 | 30 days supply | Qty: 90 | Fill #4

## 2017-01-29 MED FILL — ANAGRELIDE HCL 1 MG CAPSULE: 1 | 30 days supply | Qty: 90 | Fill #5

## 2017-02-21 MED FILL — ANAGRELIDE HCL 1 MG CAPSULE: 1 | 30 days supply | Qty: 90 | Fill #6

## 2017-03-24 MED FILL — ANAGRELIDE HCL 1 MG CAPSULE: 1 | 30 days supply | Qty: 90 | Fill #0

## 2017-04-27 MED FILL — ANAGRELIDE HCL 1 MG CAPSULE: 1 | 30 days supply | Qty: 90 | Fill #1

## 2017-05-27 MED FILL — ANAGRELIDE HCL 1 MG CAPSULE: 1 | 30 days supply | Qty: 90 | Fill #2

## 2017-06-18 ENCOUNTER — Other Ambulatory Visit: Payer: Self-pay | Admitting: Hematology and Oncology

## 2017-06-18 DIAGNOSIS — D473 Essential (hemorrhagic) thrombocythemia: Secondary | ICD-10-CM

## 2017-06-19 ENCOUNTER — Telehealth: Payer: Self-pay | Admitting: Hematology and Oncology

## 2017-06-19 ENCOUNTER — Encounter: Payer: Self-pay | Admitting: Hematology and Oncology

## 2017-06-19 ENCOUNTER — Other Ambulatory Visit (HOSPITAL_BASED_OUTPATIENT_CLINIC_OR_DEPARTMENT_OTHER): Payer: 59

## 2017-06-19 ENCOUNTER — Ambulatory Visit (HOSPITAL_BASED_OUTPATIENT_CLINIC_OR_DEPARTMENT_OTHER): Payer: 59 | Admitting: Hematology and Oncology

## 2017-06-19 DIAGNOSIS — K439 Ventral hernia without obstruction or gangrene: Secondary | ICD-10-CM | POA: Diagnosis not present

## 2017-06-19 DIAGNOSIS — D473 Essential (hemorrhagic) thrombocythemia: Secondary | ICD-10-CM

## 2017-06-19 LAB — CBC WITH DIFFERENTIAL/PLATELET
BASO%: 0.5 % (ref 0.0–2.0)
Basophils Absolute: 0 10*3/uL (ref 0.0–0.1)
EOS ABS: 0 10*3/uL (ref 0.0–0.5)
EOS%: 0.7 % (ref 0.0–7.0)
HCT: 43.7 % (ref 38.4–49.9)
HEMOGLOBIN: 14.2 g/dL (ref 13.0–17.1)
LYMPH#: 1 10*3/uL (ref 0.9–3.3)
LYMPH%: 19.2 % (ref 14.0–49.0)
MCH: 26 pg — ABNORMAL LOW (ref 27.2–33.4)
MCHC: 32.6 g/dL (ref 32.0–36.0)
MCV: 79.8 fL (ref 79.3–98.0)
MONO#: 0.4 10*3/uL (ref 0.1–0.9)
MONO%: 7 % (ref 0.0–14.0)
NEUT%: 72.6 % (ref 39.0–75.0)
NEUTROS ABS: 3.9 10*3/uL (ref 1.5–6.5)
Platelets: 178 10*3/uL (ref 140–400)
RBC: 5.48 10*6/uL (ref 4.20–5.82)
RDW: 16.4 % — AB (ref 11.0–14.6)
WBC: 5.3 10*3/uL (ref 4.0–10.3)

## 2017-06-19 MED ORDER — ANAGRELIDE HCL 1 MG PO CAPS: 3.0000 mg | ORAL_CAPSULE | Freq: Every day | ORAL | 6 refills | Status: DC

## 2017-06-19 MED FILL — ANAGRELIDE HCL 1 MG CAPSULE: 1 | 30 days supply | Qty: 90 | Fill #0

## 2017-06-19 NOTE — Telephone Encounter (Signed)
Gave pt calendar and spoke with him regarding his upcoming appts. Did not want avs.

## 2017-06-19 NOTE — Progress Notes (Signed)
[facility] OFFICE PROGRESS NOTE  Patient Care Team: [name], DO as PCP - General (Family Medicine) Heath Lark, MD as Consulting Physician (Hematology and Oncology)  SUMMARY OF ONCOLOGIC HISTORY:   Essential thrombocythemia ([place])   11/03/1984 Cancer Diagnosis    He was noted to have significant abnormal CBC      05/24/2003 Bone Marrow Biopsy    Case #: [id number] BM biopsy confirmed essential thrombocythemia      11/06/2003 -  Chemotherapy    He was placed on anagrelide      08/22/2016 - 08/29/2016 Hospital Admission    The patient was admitted to the hospital due to hemoptysis. CT angiogram of the chest concerning for pneumonia. Bronchoscopy done revealed a blot clot was found in the posterior segment of the right upper lobe (B2). It was completely obstructing the airway.The clot was successfully removed, but the lesion began bleeding. BAL was performed in the RUL posterior segment (B2) of the lung and sent for bacterial, AFB and fungal analysis      08/28/2016 Procedure    He underwent selective right bronchial arteriogram was performed and the vessel was embolized        INTERVAL HISTORY: Please see below for problem oriented charting. He feels well He denies recent infection No further hemoptysis He complained of new abdominal wall hernia He is not symptomatic with pain He managed to lose some weight since the last time I saw him He has improved diabetic control since weight loss  REVIEW OF SYSTEMS:   Constitutional: Denies fevers, chills or abnormal weight loss Eyes: Denies blurriness of vision Ears, nose, mouth, throat, and face: Denies mucositis or sore throat Respiratory: Denies cough, dyspnea or wheezes Cardiovascular: Denies palpitation, chest discomfort or lower extremity swelling Gastrointestinal:  Denies nausea, heartburn or change in bowel habits Skin: Denies abnormal skin rashes Lymphatics: Denies new lymphadenopathy or easy  bruising Neurological:Denies numbness, tingling or new weaknesses Behavioral/Psych: Mood is stable, no new changes  All other systems were reviewed with the patient and are negative.  I have reviewed the past medical history, past surgical history, social history and family history with the patient and they are unchanged from previous note.  ALLERGIES:  has No Known Allergies.  MEDICATIONS:  Current Outpatient Prescriptions  Medication Sig Dispense Refill  . anagrelide (AGRYLIN) 1 MG capsule Take 3 capsules (3 mg total) by mouth daily. 90 capsule 6  . atorvastatin (LIPITOR) 20 MG tablet Take 1 tablet (20 mg total) by mouth daily. NO MORE REFILLS WITHOUT OFFICE VISIT/ LABS - 2ND NOTICE 15 tablet 0  . fluticasone (FLONASE) 50 MCG/ACT nasal spray PLACE 2 SPRAYS IN EACH NOSTRIL EVERY DAY 16 g 1  . glimepiride (AMARYL) 4 MG tablet TAKE 1 TABLET (4 MG TOTAL) BY MOUTH DAILY BEFORE BREAKFAST.  "NO MORE REFILLS WITHOUT OV" (Patient taking differently: 4 mg. TAKE 1 TABLET (4 MG TOTAL) BY MOUTH DAILY BEFORE BREAKFAST.  "NO MORE REFILLS WITHOUT OV") 30 tablet 0  . glucose blood (ONE TOUCH ULTRA TEST) test strip Use as instructed 100 each 0  . hydrochlorothiazide (MICROZIDE) 12.5 MG capsule TAKE ONE CAPSULE BY MOUTH EVERY DAY 30 capsule 0  . Lancets (ONETOUCH ULTRASOFT) lancets Use as instructed 100 each 0  . lisinopril (PRINIVIL,ZESTRIL) 40 MG tablet Take 1 tablet (40 mg total) by mouth daily. 30 tablet 0  . LORazepam (ATIVAN) 1 MG tablet TAKE 1 TABLET BY MOUTH 3 TIMES A DAY AS NEEDED FOR ANXIETY 30 tablet 2  .  MATZIM LA 420 MG 24 hr tablet TAKE 1 TABLET (420 MG TOTAL) BY MOUTH DAILY. 15 tablet 0  . metFORMIN (GLUCOPHAGE-XR) 500 MG 24 hr tablet TAKE 4 TABLETS BY MOUTH EVERY DAY  "OFFICE VISIT NEEDED" (Patient taking differently: Take 1,000 mg by mouth 2 (two) times daily. ) 60 tablet 0  . Multiple Vitamins-Minerals (MULTIVITAMIN WITH MINERALS) tablet Take 1 tablet by mouth daily.    Marland Kitchen SYNTHROID 175 MCG  tablet TAKE 1 TABLET (175 MCG TOTAL) BY MOUTH DAILY  "NO MORE REFILLS WITHOUT OFFICE VISIT" (Patient taking differently: 212 mcg. TAKE 1 TABLET (175 MCG TOTAL) BY MOUTH DAILY  "NO MORE REFILLS WITHOUT OFFICE VISIT") 15 tablet 0   No current facility-administered medications for this visit.     PHYSICAL EXAMINATION: ECOG PERFORMANCE STATUS: 0 - Asymptomatic  Vitals:   06/19/17 1229  BP: (!) 163/78  Pulse: 80  Resp: 18  Temp: 98.6 F (37 C)  SpO2: 98%   Filed Weights   06/19/17 1229  Weight: 186 lb 4.8 oz (84.5 kg)    GENERAL:alert, no distress and comfortable SKIN: skin color, texture, turgor are normal, no rashes or significant lesions EYES: normal, Conjunctiva are pink and non-injected, sclera clear OROPHARYNX:no exudate, no erythema and lips, buccal mucosa, and tongue normal  NECK: supple, thyroid normal size, non-tender, without nodularity LYMPH:  no palpable lymphadenopathy in the cervical, axillary or inguinal LUNGS: clear to auscultation and percussion with normal breathing effort HEART: regular rate & rhythm and no murmurs and no lower extremity edema ABDOMEN:abdomen soft, non-tender and normal bowel sounds. Noted abdominal wall hernia Musculoskeletal:no cyanosis of digits and no clubbing  NEURO: alert & oriented x 3 with fluent speech, no focal motor/sensory deficits  LABORATORY DATA:  I have reviewed the data as listed    Component Value Date/Time   NA 137 12/16/2016 1328   K 4.1 12/16/2016 1328   CL 100 (L) 08/29/2016 0934   CL 105 09/17/2012 1030   CO2 24 12/16/2016 1328   GLUCOSE 154 (H) 12/16/2016 1328   GLUCOSE 140 (H) 09/17/2012 1030   BUN 14.1 12/16/2016 1328   CREATININE 0.9 12/16/2016 1328   CALCIUM 9.9 12/16/2016 1328   PROT 7.3 12/16/2016 1328   ALBUMIN 4.0 12/16/2016 1328   AST 24 12/16/2016 1328   ALT 39 12/16/2016 1328   ALKPHOS 118 12/16/2016 1328   BILITOT 0.36 12/16/2016 1328   GFRNONAA >60 08/29/2016 0934   GFRAA >60 08/29/2016 0934     No results found for: SPEP, UPEP  Lab Results  Component Value Date   WBC 5.3 06/19/2017   NEUTROABS 3.9 06/19/2017   HGB 14.2 06/19/2017   HCT 43.7 06/19/2017   MCV 79.8 06/19/2017   PLT 178 06/19/2017      Chemistry      Component Value Date/Time   NA 137 12/16/2016 1328   K 4.1 12/16/2016 1328   CL 100 (L) 08/29/2016 0934   CL 105 09/17/2012 1030   CO2 24 12/16/2016 1328   BUN 14.1 12/16/2016 1328   CREATININE 0.9 12/16/2016 1328      Component Value Date/Time   CALCIUM 9.9 12/16/2016 1328   ALKPHOS 118 12/16/2016 1328   AST 24 12/16/2016 1328   ALT 39 12/16/2016 1328   BILITOT 0.36 12/16/2016 1328      ASSESSMENT & PLAN:  Essential thrombocythemia ([place]) He tolerated Anagrelide well I recommend treatment indefinitely Due to high risk of bleeding, aspirin is stopped permanently  Abdominal wall hernia The  patient complained of abdominal wall hernia He is relatively asymptomatic He managed to lose 8 pounds of weight since the last time he was seen here in I encouraged him weight loss effort I do not recommend surgical repair since he is not symptomatic   No orders of the defined types were placed in this encounter.  All questions were answered. The patient knows to call the clinic with any problems, questions or concerns. No barriers to learning was detected. I spent 10 minutes counseling the patient face to face. The total time spent in the appointment was 15 minutes and more than 50% was on counseling and review of test results     Heath Lark, MD 06/19/2017 1:59 PM

## 2017-06-19 NOTE — Assessment & Plan Note (Signed)
He tolerated Anagrelide well I recommend treatment indefinitely Due to high risk of bleeding, aspirin is stopped permanently 

## 2017-06-19 NOTE — Assessment & Plan Note (Signed)
The patient complained of abdominal wall hernia He is relatively asymptomatic He managed to lose 8 pounds of weight since the last time he was seen here in I encouraged him weight loss effort I do not recommend surgical repair since he is not symptomatic

## 2017-07-16 ENCOUNTER — Encounter: Payer: Self-pay | Admitting: Pulmonary Disease

## 2017-07-16 ENCOUNTER — Ambulatory Visit (INDEPENDENT_AMBULATORY_CARE_PROVIDER_SITE_OTHER): Payer: 59 | Admitting: Pulmonary Disease

## 2017-07-16 VITALS — BP 132/70 | HR 68 | Ht 67.5 in | Wt 189.1 lb

## 2017-07-16 DIAGNOSIS — R042 Hemoptysis: Secondary | ICD-10-CM

## 2017-07-16 NOTE — Progress Notes (Signed)
Subjective:    Patient ID: Charles Bowman, male    DOB: 08/12/1961, 56 y.o.   MRN: [id number]  Synopsis: Evaluated for the second time by [facility] in 2017 for hemoptysis in the setting of right upper lobe pneumonia. Treated with antibiotics but continued to have hemoptysis. Ultimately required embolization of the right upper lobe vasculature.  HPI Chief Complaint  Patient presents with  . Follow-up    follow up from hospitalization last year. pt states he is at baseline normal feeling great.    Charles Bowman has been working a lot lately and has been really busy.   He has not had any respiratory problem.  He has not had any hemoptysis at all.  He has been seeing general surgery for a ventral hernia.    Past Medical History:  Diagnosis Date  . Anemia   . Anxiety 05/11/2014  . Cancer ([place])   . Centrilobular emphysema ([place]) 12/19/2016  . Diabetes mellitus without complication ([place])   . Hypertension   . Murmur 11/09/2014  . Thyroid disease       Review of Systems     Objective:   Physical Exam Vitals:   07/16/17 1557  BP: 132/70  Pulse: 68  SpO2: 97%  Weight: 189 lb 2 oz (85.8 kg)  Height: 5' 7.5" (1.715 m)    Gen: well appearing HENT: OP clear, TM's clear, neck supple PULM: CTA B, normal percussion CV: RRR, no mgr, trace edema GI: BS+, soft, nontender Derm: no cyanosis or rash Psyche: normal mood and affect         Assessment & Plan:   Hemoptysis  Discussion: This is been a stable interval for Charles Bowman. He has not had an exacerbation of hemoptysis or cough since the last visit. Since his interventional radiology guided right upper lobe embolectomy I don't believe are going to see this happen again.  Plan: The following behaviors have been associated with weight loss: Weigh yourself daily Write down everything you eat Drink a glass of water prior to eating a meal  We will see you back on an as-needed basis    Current Outpatient Prescriptions:  .   anagrelide (AGRYLIN) 1 MG capsule, Take 3 capsules (3 mg total) by mouth daily., Disp: 90 capsule, Rfl: 6 .  atorvastatin (LIPITOR) 20 MG tablet, Take 1 tablet (20 mg total) by mouth daily. NO MORE REFILLS WITHOUT OFFICE VISIT/ LABS - 2ND NOTICE, Disp: 15 tablet, Rfl: 0 .  fluticasone (FLONASE) 50 MCG/ACT nasal spray, PLACE 2 SPRAYS IN EACH NOSTRIL EVERY DAY, Disp: 16 g, Rfl: 1 .  glimepiride (AMARYL) 4 MG tablet, TAKE 1 TABLET (4 MG TOTAL) BY MOUTH DAILY BEFORE BREAKFAST.  "NO MORE REFILLS WITHOUT OV" (Patient taking differently: 4 mg. TAKE 1 TABLET (4 MG TOTAL) BY MOUTH DAILY BEFORE BREAKFAST.  "NO MORE REFILLS WITHOUT OV"), Disp: 30 tablet, Rfl: 0 .  glucose blood (ONE TOUCH ULTRA TEST) test strip, Use as instructed, Disp: 100 each, Rfl: 0 .  hydrochlorothiazide (MICROZIDE) 12.5 MG capsule, TAKE ONE CAPSULE BY MOUTH EVERY DAY, Disp: 30 capsule, Rfl: 0 .  Lancets (ONETOUCH ULTRASOFT) lancets, Use as instructed, Disp: 100 each, Rfl: 0 .  lisinopril (PRINIVIL,ZESTRIL) 40 MG tablet, Take 1 tablet (40 mg total) by mouth daily., Disp: 30 tablet, Rfl: 0 .  MATZIM LA 420 MG 24 hr tablet, TAKE 1 TABLET (420 MG TOTAL) BY MOUTH DAILY., Disp: 15 tablet, Rfl: 0 .  metFORMIN (GLUCOPHAGE-XR) 500 MG 24 hr tablet, TAKE 4  TABLETS BY MOUTH EVERY DAY  "OFFICE VISIT NEEDED" (Patient taking differently: Take 1,000 mg by mouth 2 (two) times daily. ), Disp: 60 tablet, Rfl: 0 .  Multiple Vitamins-Minerals (MULTIVITAMIN WITH MINERALS) tablet, Take 1 tablet by mouth daily., Disp: , Rfl:  .  SYNTHROID 175 MCG tablet, TAKE 1 TABLET (175 MCG TOTAL) BY MOUTH DAILY  "NO MORE REFILLS WITHOUT OFFICE VISIT" (Patient taking differently: 212 mcg. TAKE 1 TABLET (175 MCG TOTAL) BY MOUTH DAILY  "NO MORE REFILLS WITHOUT OFFICE VISIT"), Disp: 15 tablet, Rfl: 0 .  traZODone (DESYREL) 50 MG tablet, Take 50 mg by mouth at bedtime., Disp: , Rfl:

## 2017-07-16 NOTE — Patient Instructions (Signed)
The following behaviors have been associated with weight loss: Weigh yourself daily Write down everything you eat Drink a glass of water prior to eating a meal  We will see you back on an as-needed basis

## 2017-07-27 MED FILL — ANAGRELIDE HCL 1 MG CAPSULE: 1 | 30 days supply | Qty: 90 | Fill #1

## 2017-08-24 MED FILL — ANAGRELIDE HCL 1 MG CAPSULE: 1 | 30 days supply | Qty: 90 | Fill #2

## 2017-09-25 MED FILL — ANAGRELIDE HCL 1 MG CAPSULE: 1 | 30 days supply | Qty: 90 | Fill #3

## 2017-10-22 MED FILL — ANAGRELIDE HCL 1 MG CAPSULE: 1 | 30 days supply | Qty: 90 | Fill #4

## 2017-11-20 MED FILL — ANAGRELIDE HCL 1 MG CAPSULE: 1 | 30 days supply | Qty: 90 | Fill #5

## 2017-12-17 ENCOUNTER — Other Ambulatory Visit: Payer: Self-pay

## 2017-12-17 DIAGNOSIS — D638 Anemia in other chronic diseases classified elsewhere: Secondary | ICD-10-CM

## 2017-12-18 ENCOUNTER — Inpatient Hospital Stay: Payer: 59

## 2017-12-18 ENCOUNTER — Inpatient Hospital Stay: Payer: 59 | Attending: Hematology and Oncology | Admitting: Hematology and Oncology

## 2017-12-18 ENCOUNTER — Telehealth: Payer: Self-pay | Admitting: Hematology and Oncology

## 2017-12-18 ENCOUNTER — Encounter: Payer: Self-pay | Admitting: Hematology and Oncology

## 2017-12-18 DIAGNOSIS — D473 Essential (hemorrhagic) thrombocythemia: Secondary | ICD-10-CM | POA: Diagnosis not present

## 2017-12-18 DIAGNOSIS — E119 Type 2 diabetes mellitus without complications: Secondary | ICD-10-CM | POA: Diagnosis not present

## 2017-12-18 DIAGNOSIS — Z79899 Other long term (current) drug therapy: Secondary | ICD-10-CM | POA: Diagnosis not present

## 2017-12-18 DIAGNOSIS — I10 Essential (primary) hypertension: Secondary | ICD-10-CM | POA: Diagnosis not present

## 2017-12-18 DIAGNOSIS — D638 Anemia in other chronic diseases classified elsewhere: Secondary | ICD-10-CM

## 2017-12-18 DIAGNOSIS — Z7984 Long term (current) use of oral hypoglycemic drugs: Secondary | ICD-10-CM | POA: Insufficient documentation

## 2017-12-18 LAB — CMP (CANCER CENTER ONLY)
ALBUMIN: 3.8 g/dL (ref 3.5–5.0)
ALT: 39 U/L (ref 0–55)
AST: 22 U/L (ref 5–34)
Alkaline Phosphatase: 107 U/L (ref 40–150)
Anion gap: 10 (ref 3–11)
BILIRUBIN TOTAL: 0.4 mg/dL (ref 0.2–1.2)
BUN: 24 mg/dL (ref 7–26)
CO2: 26 mmol/L (ref 22–29)
Calcium: 9.5 mg/dL (ref 8.4–10.4)
Chloride: 100 mmol/L (ref 98–109)
Creatinine: 1.22 mg/dL (ref 0.70–1.30)
GFR, Est AFR Am: >60 mL/min (ref >60)
GLUCOSE: 185 mg/dL — AB (ref 70–140)
POTASSIUM: 4.4 mmol/L (ref 3.5–5.1)
Sodium: 136 mmol/L (ref 136–145)
TOTAL PROTEIN: 6.9 g/dL (ref 6.4–8.3)

## 2017-12-18 LAB — CBC WITH DIFFERENTIAL (CANCER CENTER ONLY)
BASOS ABS: 0 10*3/uL (ref 0.0–0.1)
Basophils Relative: 0 %
Eosinophils Absolute: 0.1 10*3/uL (ref 0.0–0.5)
Eosinophils Relative: 2 %
HEMATOCRIT: 43.6 % (ref 38.4–49.9)
Hemoglobin: 14.5 g/dL (ref 13.0–17.1)
LYMPHS PCT: 19 %
Lymphs Abs: 0.9 10*3/uL (ref 0.9–3.3)
MCH: 27 pg — ABNORMAL LOW (ref 27.2–33.4)
MCHC: 33.2 g/dL (ref 32.0–36.0)
MCV: 81.4 fL (ref 79.3–98.0)
Monocytes Absolute: 0.4 10*3/uL (ref 0.1–0.9)
Monocytes Relative: 8 %
NEUTROS ABS: 3.4 10*3/uL (ref 1.5–6.5)
Neutrophils Relative %: 71 %
Platelet Count: 202 10*3/uL (ref 140–400)
RBC: 5.36 MIL/uL (ref 4.20–5.82)
RDW: 15.1 % — ABNORMAL HIGH (ref 11.0–14.6)
WBC: 4.8 10*3/uL (ref 4.0–10.3)

## 2017-12-18 NOTE — Assessment & Plan Note (Signed)
He tolerated Anagrelide well I recommend treatment indefinitely Due to high risk of bleeding, aspirin is stopped permanently

## 2017-12-18 NOTE — Assessment & Plan Note (Signed)
He has elevated blood pressure recently, likely due to weight gain and possibly increased salt intake We discussed dietary modification and lifestyle changes he will continue current medical management. I recommend close follow-up with primary care doctor for medication adjustment.

## 2017-12-18 NOTE — Telephone Encounter (Signed)
Gave patient AVs and calendar of upcoming august appointments.  °

## 2017-12-18 NOTE — Assessment & Plan Note (Signed)
He had recent weight gain with poorly controlled diabetes We discussed lifestyle modification and dietary change He will continue close follow-up with primary care doctor

## 2017-12-18 NOTE — Progress Notes (Signed)
[place] OFFICE PROGRESS NOTE  Patient Care Team: [name], DO as PCP - General (Family Medicine) Heath Lark, MD as Consulting Physician (Hematology and Oncology)  ASSESSMENT & PLAN:  Essential thrombocythemia [facility]) He tolerated Anagrelide well I recommend treatment indefinitely Due to high risk of bleeding, aspirin is stopped permanently  Diabetes mellitus type 2, uncomplicated ([place]) He had recent weight gain with poorly controlled diabetes We discussed lifestyle modification and dietary change He will continue close follow-up with primary care doctor  Essential hypertension He has elevated blood pressure recently, likely due to weight gain and possibly increased salt intake We discussed dietary modification and lifestyle changes he will continue current medical management. I recommend close follow-up with primary care doctor for medication adjustment.    No orders of the defined types were placed in this encounter.   INTERVAL HISTORY: Please see below for problem oriented charting. He feels well He has some weight gain recently He denies headache from elevated blood pressure He is undergoing a lot of stress at work He denies recent infection He is compliant taking anagrelide as  prescribed.  He denies recent bleeding or hemoptysis since discontinuation of aspirin.  SUMMARY OF ONCOLOGIC HISTORY:   Essential thrombocythemia ([place])   11/03/1984 Cancer Diagnosis    He was noted to have significant abnormal CBC      05/24/2003 Bone Marrow Biopsy    Case #: [id number] BM biopsy confirmed essential thrombocythemia      11/06/2003 -  Chemotherapy    He was placed on anagrelide      08/22/2016 - 08/29/2016 Hospital Admission    The patient was admitted to the hospital due to hemoptysis. CT angiogram of the chest concerning for pneumonia. Bronchoscopy done revealed a blot clot was found in the posterior segment of the right upper lobe (B2). It was  completely obstructing the airway.The clot was successfully removed, but the lesion began bleeding. BAL was performed in the RUL posterior segment (B2) of the lung and sent for bacterial, AFB and fungal analysis      08/28/2016 Procedure    He underwent selective right bronchial arteriogram was performed and the vessel was embolized        REVIEW OF SYSTEMS:   Constitutional: Denies fevers, chills or abnormal weight loss Eyes: Denies blurriness of vision Ears, nose, mouth, throat, and face: Denies mucositis or sore throat Respiratory: Denies cough, dyspnea or wheezes Cardiovascular: Denies palpitation, chest discomfort or lower extremity swelling Gastrointestinal:  Denies nausea, heartburn or change in bowel habits Skin: Denies abnormal skin rashes Lymphatics: Denies new lymphadenopathy or easy bruising Neurological:Denies numbness, tingling or new weaknesses Behavioral/Psych: Mood is stable, no new changes  All other systems were reviewed with the patient and are negative.  I have reviewed the past medical history, past surgical history, social history and family history with the patient and they are unchanged from previous note.  ALLERGIES:  has No Known Allergies.  MEDICATIONS:  Current Outpatient Medications  Medication Sig Dispense Refill  . anagrelide (AGRYLIN) 1 MG capsule Take 3 capsules (3 mg total) by mouth daily. 90 capsule 6  . atorvastatin (LIPITOR) 20 MG tablet Take 1 tablet (20 mg total) by mouth daily. NO MORE REFILLS WITHOUT OFFICE VISIT/ LABS - 2ND NOTICE 15 tablet 0  . fluticasone (FLONASE) 50 MCG/ACT nasal spray PLACE 2 SPRAYS IN EACH NOSTRIL EVERY DAY 16 g 1  . glimepiride (AMARYL) 4 MG tablet TAKE 1 TABLET (4 MG TOTAL) BY MOUTH DAILY BEFORE  BREAKFAST.  "NO MORE REFILLS WITHOUT OV" (Patient taking differently: 4 mg. TAKE 1 TABLET (4 MG TOTAL) BY MOUTH DAILY BEFORE BREAKFAST.  "NO MORE REFILLS WITHOUT OV") 30 tablet 0  . glucose blood (ONE TOUCH ULTRA TEST) test  strip Use as instructed 100 each 0  . hydrochlorothiazide (MICROZIDE) 12.5 MG capsule TAKE ONE CAPSULE BY MOUTH EVERY DAY 30 capsule 0  . Lancets (ONETOUCH ULTRASOFT) lancets Use as instructed 100 each 0  . lisinopril (PRINIVIL,ZESTRIL) 40 MG tablet Take 1 tablet (40 mg total) by mouth daily. 30 tablet 0  . MATZIM LA 420 MG 24 hr tablet TAKE 1 TABLET (420 MG TOTAL) BY MOUTH DAILY. 15 tablet 0  . metFORMIN (GLUCOPHAGE-XR) 500 MG 24 hr tablet TAKE 4 TABLETS BY MOUTH EVERY DAY  "OFFICE VISIT NEEDED" (Patient taking differently: Take 1,000 mg by mouth 2 (two) times daily. ) 60 tablet 0  . Multiple Vitamins-Minerals (MULTIVITAMIN WITH MINERALS) tablet Take 1 tablet by mouth daily.    Marland Kitchen SYNTHROID 175 MCG tablet TAKE 1 TABLET (175 MCG TOTAL) BY MOUTH DAILY  "NO MORE REFILLS WITHOUT OFFICE VISIT" (Patient taking differently: 212 mcg. TAKE 1 TABLET (175 MCG TOTAL) BY MOUTH DAILY  "NO MORE REFILLS WITHOUT OFFICE VISIT") 15 tablet 0  . traZODone (DESYREL) 50 MG tablet Take 50 mg by mouth at bedtime.     No current facility-administered medications for this visit.     PHYSICAL EXAMINATION: ECOG PERFORMANCE STATUS: 0 - Asymptomatic  Vitals:   12/18/17 1449  BP: (!) 198/71  Pulse: 78  Resp: 18  Temp: (!) 97.5 F (36.4 C)  SpO2: 98%   Filed Weights   12/18/17 1449  Weight: 191 lb 1.6 oz (86.7 kg)    GENERAL:alert, no distress and comfortable SKIN: skin color, texture, turgor are normal, no rashes or significant lesions EYES: normal, Conjunctiva are pink and non-injected, sclera clear OROPHARYNX:no exudate, no erythema and lips, buccal mucosa, and tongue normal  NECK: supple, thyroid normal size, non-tender, without nodularity LYMPH:  no palpable lymphadenopathy in the cervical, axillary or inguinal LUNGS: clear to auscultation and percussion with normal breathing effort HEART: regular rate & rhythm and no murmurs and no lower extremity edema ABDOMEN:abdomen soft, non-tender and normal bowel  sounds Musculoskeletal:no cyanosis of digits and no clubbing  NEURO: alert & oriented x 3 with fluent speech, no focal motor/sensory deficits  LABORATORY DATA:  I have reviewed the data as listed    Component Value Date/Time   NA 136 12/18/2017 1427   NA 137 12/16/2016 1328   K 4.4 12/18/2017 1427   K 4.1 12/16/2016 1328   CL 100 12/18/2017 1427   CL 105 09/17/2012 1030   CO2 26 12/18/2017 1427   CO2 24 12/16/2016 1328   GLUCOSE 185 (H) 12/18/2017 1427   GLUCOSE 154 (H) 12/16/2016 1328   GLUCOSE 140 (H) 09/17/2012 1030   BUN 24 12/18/2017 1427   BUN 14.1 12/16/2016 1328   CREATININE 1.22 12/18/2017 1427   CREATININE 0.9 12/16/2016 1328   CALCIUM 9.5 12/18/2017 1427   CALCIUM 9.9 12/16/2016 1328   PROT 6.9 12/18/2017 1427   PROT 7.3 12/16/2016 1328   ALBUMIN 3.8 12/18/2017 1427   ALBUMIN 4.0 12/16/2016 1328   AST 22 12/18/2017 1427   AST 24 12/16/2016 1328   ALT 39 12/18/2017 1427   ALT 39 12/16/2016 1328   ALKPHOS 107 12/18/2017 1427   ALKPHOS 118 12/16/2016 1328   BILITOT 0.4 12/18/2017 1427   BILITOT 0.36 12/16/2016  [place] 12/18/2017 1427   GFRAA >60 12/18/2017 1427    No results found for: SPEP, UPEP  Lab Results  Component Value Date   WBC 4.8 12/18/2017   NEUTROABS 3.4 12/18/2017   HGB 14.2 06/19/2017   HCT 43.6 12/18/2017   MCV 81.4 12/18/2017   PLT 202 12/18/2017      Chemistry      Component Value Date/Time   NA 136 12/18/2017 1427   NA 137 12/16/2016 1328   K 4.4 12/18/2017 1427   K 4.1 12/16/2016 1328   CL 100 12/18/2017 1427   CL 105 09/17/2012 1030   CO2 26 12/18/2017 1427   CO2 24 12/16/2016 1328   BUN 24 12/18/2017 1427   BUN 14.1 12/16/2016 1328   CREATININE 1.22 12/18/2017 1427   CREATININE 0.9 12/16/2016 1328      Component Value Date/Time   CALCIUM 9.5 12/18/2017 1427   CALCIUM 9.9 12/16/2016 1328   ALKPHOS 107 12/18/2017 1427   ALKPHOS 118 12/16/2016 1328   AST 22 12/18/2017 1427   AST 24 12/16/2016 1328   ALT  39 12/18/2017 1427   ALT 39 12/16/2016 1328   BILITOT 0.4 12/18/2017 1427   BILITOT 0.36 12/16/2016 1328     All questions were answered. The patient knows to call the clinic with any problems, questions or concerns. No barriers to learning was detected.  I spent 15 minutes counseling the patient face to face. The total time spent in the appointment was 20 minutes and more than 50% was on counseling and review of test results  Heath Lark, MD 12/18/2017 3:22 PM

## 2017-12-24 MED FILL — ANAGRELIDE HCL 1 MG CAPSULE: 1 | 30 days supply | Qty: 90 | Fill #6

## 2018-01-22 ENCOUNTER — Other Ambulatory Visit: Payer: Self-pay | Admitting: Hematology and Oncology

## 2018-01-22 MED FILL — ANAGRELIDE HCL 1 MG CAPSULE: 1 | 30 days supply | Qty: 90 | Fill #0

## 2018-02-24 MED FILL — ANAGRELIDE HCL 1 MG CAPSULE: 1 | 30 days supply | Qty: 90 | Fill #1

## 2018-03-22 MED FILL — ANAGRELIDE HCL 1 MG CAPSULE: 1 | 30 days supply | Qty: 90 | Fill #2

## 2018-04-22 MED FILL — ANAGRELIDE HCL 1 MG CAPSULE: 1 | 30 days supply | Qty: 90 | Fill #3

## 2018-05-27 MED FILL — ANAGRELIDE HCL 1 MG CAPSULE: 1 | 30 days supply | Qty: 90 | Fill #4

## 2018-06-18 ENCOUNTER — Ambulatory Visit: Payer: 59 | Admitting: Hematology and Oncology

## 2018-06-18 ENCOUNTER — Other Ambulatory Visit: Payer: 59

## 2018-06-22 MED FILL — ANAGRELIDE HCL 1 MG CAPSULE: 1 | 30 days supply | Qty: 90 | Fill #5

## 2018-07-01 ENCOUNTER — Other Ambulatory Visit: Payer: Self-pay | Admitting: Hematology and Oncology

## 2018-07-01 DIAGNOSIS — D473 Essential (hemorrhagic) thrombocythemia: Secondary | ICD-10-CM

## 2018-07-02 ENCOUNTER — Encounter: Payer: Self-pay | Admitting: Hematology and Oncology

## 2018-07-02 ENCOUNTER — Inpatient Hospital Stay: Payer: 59 | Attending: Hematology

## 2018-07-02 ENCOUNTER — Telehealth: Payer: Self-pay | Admitting: Hematology and Oncology

## 2018-07-02 ENCOUNTER — Inpatient Hospital Stay (HOSPITAL_BASED_OUTPATIENT_CLINIC_OR_DEPARTMENT_OTHER): Payer: 59 | Admitting: Hematology and Oncology

## 2018-07-02 DIAGNOSIS — E663 Overweight: Secondary | ICD-10-CM | POA: Diagnosis not present

## 2018-07-02 DIAGNOSIS — Z7984 Long term (current) use of oral hypoglycemic drugs: Secondary | ICD-10-CM | POA: Insufficient documentation

## 2018-07-02 DIAGNOSIS — Z79899 Other long term (current) drug therapy: Secondary | ICD-10-CM | POA: Insufficient documentation

## 2018-07-02 DIAGNOSIS — D473 Essential (hemorrhagic) thrombocythemia: Secondary | ICD-10-CM | POA: Insufficient documentation

## 2018-07-02 DIAGNOSIS — I10 Essential (primary) hypertension: Secondary | ICD-10-CM

## 2018-07-02 DIAGNOSIS — E119 Type 2 diabetes mellitus without complications: Secondary | ICD-10-CM | POA: Insufficient documentation

## 2018-07-02 LAB — CBC WITH DIFFERENTIAL/PLATELET
Basophils Absolute: 0 10*3/uL (ref 0.0–0.1)
Basophils Relative: 0 %
EOS PCT: 3 %
Eosinophils Absolute: 0.1 10*3/uL (ref 0.0–0.5)
HCT: 41.8 % (ref 38.4–49.9)
Hemoglobin: 13.8 g/dL (ref 13.0–17.1)
LYMPHS PCT: 19 %
Lymphs Abs: 0.9 10*3/uL (ref 0.9–3.3)
MCH: 27.2 pg (ref 27.2–33.4)
MCHC: 33 g/dL (ref 32.0–36.0)
MCV: 82.5 fL (ref 79.3–98.0)
MONOS PCT: 8 %
Monocytes Absolute: 0.4 10*3/uL (ref 0.1–0.9)
Neutro Abs: 3.4 10*3/uL (ref 1.5–6.5)
Neutrophils Relative %: 70 %
PLATELETS: 211 10*3/uL (ref 140–400)
RBC: 5.07 MIL/uL (ref 4.20–5.82)
RDW: 14.9 % — ABNORMAL HIGH (ref 11.0–14.6)
WBC: 4.9 10*3/uL (ref 4.0–10.3)

## 2018-07-02 LAB — COMPREHENSIVE METABOLIC PANEL
ALBUMIN: 3.9 g/dL (ref 3.5–5.0)
ALT: 34 U/L (ref 0–44)
AST: 19 U/L (ref 15–41)
Alkaline Phosphatase: 114 U/L (ref 38–126)
Anion gap: 10 (ref 5–15)
BUN: 29 mg/dL — AB (ref 6–20)
CHLORIDE: 100 mmol/L (ref 98–111)
CO2: 27 mmol/L (ref 22–32)
Calcium: 9.3 mg/dL (ref 8.9–10.3)
Creatinine, Ser: 1.32 mg/dL — ABNORMAL HIGH (ref 0.61–1.24)
GFR calc Af Amer: >60 mL/min (ref >60)
GFR, EST NON AFRICAN AMERICAN: 60 mL/min — AB (ref >60)
GLUCOSE: 213 mg/dL — AB (ref 70–99)
POTASSIUM: 4.5 mmol/L (ref 3.5–5.1)
Sodium: 137 mmol/L (ref 135–145)
Total Bilirubin: 0.3 mg/dL (ref 0.3–1.2)
Total Protein: 6.8 g/dL (ref 6.5–8.1)

## 2018-07-02 MED ORDER — ANAGRELIDE HCL 1 MG PO CAPS: 3.0000 mg | ORAL_CAPSULE | Freq: Every day | ORAL | 6 refills | Status: DC

## 2018-07-02 NOTE — Assessment & Plan Note (Signed)
His CBC is well controlled with current prescription of anagrelide I will see him in 6 months for further follow-up

## 2018-07-02 NOTE — Assessment & Plan Note (Signed)
The patient is overweight with diagnosis of diabetes and hypertension We discussed the importance of healthy living

## 2018-07-02 NOTE — Telephone Encounter (Signed)
Gave pt avs and calendar  °

## 2018-07-02 NOTE — Assessment & Plan Note (Signed)
There is certainly an element of whitecoat hypertension His blood pressure at home is mildly elevated with systolic blood pressure usually in the 140s We discussed the importance of dietary modification and exercise along with weight loss for risk factor control of blood pressure

## 2018-07-02 NOTE — Addendum Note (Signed)
Addended by: Sharlynn Oliphant A on: 07/02/2018 10:14 AM   Modules accepted: Orders

## 2018-07-02 NOTE — Progress Notes (Signed)
[place] OFFICE PROGRESS NOTE  Patient Care Team: [name], DO as PCP - General (Family Medicine) Heath Lark, MD as Consulting Physician (Hematology and Oncology)  ASSESSMENT & PLAN:  Essential thrombocythemia [facility]) His CBC is well controlled with current prescription of anagrelide I will see him in 6 months for further follow-up  Essential hypertension There is certainly an element of whitecoat hypertension His blood pressure at home is mildly elevated with systolic blood pressure usually in the 140s We discussed the importance of dietary modification and exercise along with weight loss for risk factor control of blood pressure  Diabetes mellitus type 2, uncomplicated ([place]) The patient is overweight with diagnosis of diabetes and hypertension We discussed the importance of healthy living   No orders of the defined types were placed in this encounter.   INTERVAL HISTORY: Please see below for problem oriented charting. He returns for further follow-up Denies recent infection, fever or chills He is compliant taking anagrelide as directed No recent bleeding  SUMMARY OF ONCOLOGIC HISTORY:   Essential thrombocythemia ([place])   11/03/1984 Cancer Diagnosis    He was noted to have significant abnormal CBC    05/24/2003 Bone Marrow Biopsy    Case #: [id number] BM biopsy confirmed essential thrombocythemia    11/06/2003 -  Chemotherapy    He was placed on anagrelide    08/22/2016 - 08/29/2016 Hospital Admission    The patient was admitted to the hospital due to hemoptysis. CT angiogram of the chest concerning for pneumonia. Bronchoscopy done revealed a blot clot was found in the posterior segment of the right upper lobe (B2). It was completely obstructing the airway.The clot was successfully removed, but the lesion began bleeding. BAL was performed in the RUL posterior segment (B2) of the lung and sent for bacterial, AFB and fungal analysis    08/28/2016 Procedure     He underwent selective right bronchial arteriogram was performed and the vessel was embolized      REVIEW OF SYSTEMS:   Constitutional: Denies fevers, chills or abnormal weight loss Eyes: Denies blurriness of vision Ears, nose, mouth, throat, and face: Denies mucositis or sore throat Respiratory: Denies cough, dyspnea or wheezes Cardiovascular: Denies palpitation, chest discomfort or lower extremity swelling Gastrointestinal:  Denies nausea, heartburn or change in bowel habits Skin: Denies abnormal skin rashes Lymphatics: Denies new lymphadenopathy or easy bruising Neurological:Denies numbness, tingling or new weaknesses Behavioral/Psych: Mood is stable, no new changes  All other systems were reviewed with the patient and are negative.  I have reviewed the past medical history, past surgical history, social history and family history with the patient and they are unchanged from previous note.  ALLERGIES:  has No Known Allergies.  MEDICATIONS:  Current Outpatient Medications  Medication Sig Dispense Refill  . anagrelide (AGRYLIN) 1 MG capsule Take 3 capsules (3 mg total) by mouth daily. 90 capsule 6  . atorvastatin (LIPITOR) 20 MG tablet Take 1 tablet (20 mg total) by mouth daily. NO MORE REFILLS WITHOUT OFFICE VISIT/ LABS - 2ND NOTICE 15 tablet 0  . fluticasone (FLONASE) 50 MCG/ACT nasal spray PLACE 2 SPRAYS IN EACH NOSTRIL EVERY DAY 16 g 1  . glimepiride (AMARYL) 4 MG tablet TAKE 1 TABLET (4 MG TOTAL) BY MOUTH DAILY BEFORE BREAKFAST.  "NO MORE REFILLS WITHOUT OV" (Patient taking differently: 4 mg. TAKE 1 TABLET (4 MG TOTAL) BY MOUTH DAILY BEFORE BREAKFAST.  "NO MORE REFILLS WITHOUT OV") 30 tablet 0  . glucose blood (ONE TOUCH ULTRA TEST)  test strip Use as instructed 100 each 0  . hydrochlorothiazide (MICROZIDE) 12.5 MG capsule TAKE ONE CAPSULE BY MOUTH EVERY DAY 30 capsule 0  . Lancets (ONETOUCH ULTRASOFT) lancets Use as instructed 100 each 0  . lisinopril (PRINIVIL,ZESTRIL) 40 MG  tablet Take 1 tablet (40 mg total) by mouth daily. 30 tablet 0  . MATZIM LA 420 MG 24 hr tablet TAKE 1 TABLET (420 MG TOTAL) BY MOUTH DAILY. 15 tablet 0  . metFORMIN (GLUCOPHAGE-XR) 500 MG 24 hr tablet TAKE 4 TABLETS BY MOUTH EVERY DAY  "OFFICE VISIT NEEDED" (Patient taking differently: Take 1,000 mg by mouth 2 (two) times daily. ) 60 tablet 0  . Multiple Vitamins-Minerals (MULTIVITAMIN WITH MINERALS) tablet Take 1 tablet by mouth daily.    Marland Kitchen SYNTHROID 175 MCG tablet TAKE 1 TABLET (175 MCG TOTAL) BY MOUTH DAILY  "NO MORE REFILLS WITHOUT OFFICE VISIT" (Patient taking differently: 212 mcg. TAKE 1 TABLET (175 MCG TOTAL) BY MOUTH DAILY  "NO MORE REFILLS WITHOUT OFFICE VISIT") 15 tablet 0  . traZODone (DESYREL) 50 MG tablet Take 50 mg by mouth at bedtime.     No current facility-administered medications for this visit.     PHYSICAL EXAMINATION: ECOG PERFORMANCE STATUS: 0 - Asymptomatic  Vitals:   07/02/18 0818  BP: (!) 191/74  Pulse: 79  Resp: 16  Temp: (!) 97.5 F (36.4 C)  SpO2: 94%   Filed Weights   07/02/18 0818  Weight: 191 lb (86.6 kg)    GENERAL:alert, no distress and comfortable SKIN: skin color, texture, turgor are normal, no rashes or significant lesions Musculoskeletal:no cyanosis of digits and no clubbing  NEURO: alert & oriented x 3 with fluent speech, no focal motor/sensory deficits  LABORATORY DATA:  I have reviewed the data as listed    Component Value Date/Time   NA 137 07/02/2018 0806   NA 137 12/16/2016 1328   K 4.5 07/02/2018 0806   K 4.1 12/16/2016 1328   CL 100 07/02/2018 0806   CL 105 09/17/2012 1030   CO2 27 07/02/2018 0806   CO2 24 12/16/2016 1328   GLUCOSE 213 (H) 07/02/2018 0806   GLUCOSE 154 (H) 12/16/2016 1328   GLUCOSE 140 (H) 09/17/2012 1030   BUN 29 (H) 07/02/2018 0806   BUN 14.1 12/16/2016 1328   CREATININE 1.32 (H) 07/02/2018 0806   CREATININE 1.22 12/18/2017 1427   CREATININE 0.9 12/16/2016 1328   CALCIUM 9.3 07/02/2018 0806   CALCIUM  9.9 12/16/2016 1328   PROT 6.8 07/02/2018 0806   PROT 7.3 12/16/2016 1328   ALBUMIN 3.9 07/02/2018 0806   ALBUMIN 4.0 12/16/2016 1328   AST 19 07/02/2018 0806   AST 22 12/18/2017 1427   AST 24 12/16/2016 1328   ALT 34 07/02/2018 0806   ALT 39 12/18/2017 1427   ALT 39 12/16/2016 1328   ALKPHOS 114 07/02/2018 0806   ALKPHOS 118 12/16/2016 1328   BILITOT 0.3 07/02/2018 0806   BILITOT 0.4 12/18/2017 1427   BILITOT 0.36 12/16/2016 1328   GFRNONAA 60 (L) 07/02/2018 0806   GFRNONAA >60 12/18/2017 1427   GFRAA >60 07/02/2018 0806   GFRAA >60 12/18/2017 1427    No results found for: SPEP, UPEP  Lab Results  Component Value Date   WBC 4.9 07/02/2018   NEUTROABS 3.4 07/02/2018   HGB 13.8 07/02/2018   HCT 41.8 07/02/2018   MCV 82.5 07/02/2018   PLT 211 07/02/2018      Chemistry      Component Value Date/Time  NA 137 07/02/2018 0806   NA 137 12/16/2016 1328   K 4.5 07/02/2018 0806   K 4.1 12/16/2016 1328   CL 100 07/02/2018 0806   CL 105 09/17/2012 1030   CO2 27 07/02/2018 0806   CO2 24 12/16/2016 1328   BUN 29 (H) 07/02/2018 0806   BUN 14.1 12/16/2016 1328   CREATININE 1.32 (H) 07/02/2018 0806   CREATININE 1.22 12/18/2017 1427   CREATININE 0.9 12/16/2016 1328      Component Value Date/Time   CALCIUM 9.3 07/02/2018 0806   CALCIUM 9.9 12/16/2016 1328   ALKPHOS 114 07/02/2018 0806   ALKPHOS 118 12/16/2016 1328   AST 19 07/02/2018 0806   AST 22 12/18/2017 1427   AST 24 12/16/2016 1328   ALT 34 07/02/2018 0806   ALT 39 12/18/2017 1427   ALT 39 12/16/2016 1328   BILITOT 0.3 07/02/2018 0806   BILITOT 0.4 12/18/2017 1427   BILITOT 0.36 12/16/2016 1328       All questions were answered. The patient knows to call the clinic with any problems, questions or concerns. No barriers to learning was detected.  I spent 10 minutes counseling the patient face to face. The total time spent in the appointment was 15 minutes and more than 50% was on counseling and review of test  results  Heath Lark, MD 07/02/2018 9:17 AM

## 2018-07-23 MED FILL — ANAGRELIDE HCL 1 MG CAPSULE: 1 | 30 days supply | Qty: 90 | Fill #6

## 2018-08-24 MED FILL — ANAGRELIDE HCL 1 MG CAPSULE: 1 | 30 days supply | Qty: 90 | Fill #0

## 2018-09-21 MED FILL — ANAGRELIDE HCL 1 MG CAPSULE: 1 | 30 days supply | Qty: 90 | Fill #1

## 2018-10-11 ENCOUNTER — Telehealth: Payer: Self-pay | Admitting: Hematology and Oncology

## 2018-10-11 NOTE — Telephone Encounter (Signed)
Tried to reach regarding voicemail, I left a message

## 2018-10-19 MED FILL — ANAGRELIDE HCL 1 MG CAPSULE: 1 | 30 days supply | Qty: 90 | Fill #2

## 2018-11-18 MED FILL — ANAGRELIDE HCL 1 MG CAPS: 1 | 30 days supply | Qty: 90 | Fill #3

## 2018-12-16 MED FILL — ANAGRELIDE HCL 1 MG CAPS: 1 | 30 days supply | Qty: 90 | Fill #4 | Status: TO

## 2018-12-21 ENCOUNTER — Other Ambulatory Visit: Payer: Self-pay | Admitting: Hematology and Oncology

## 2018-12-21 DIAGNOSIS — D473 Essential (hemorrhagic) thrombocythemia: Secondary | ICD-10-CM

## 2018-12-24 ENCOUNTER — Inpatient Hospital Stay: Payer: 59

## 2018-12-24 ENCOUNTER — Inpatient Hospital Stay: Payer: 59 | Admitting: Hematology and Oncology

## 2018-12-24 ENCOUNTER — Telehealth: Payer: Self-pay | Admitting: Hematology and Oncology

## 2018-12-24 ENCOUNTER — Telehealth: Payer: Self-pay

## 2018-12-24 NOTE — Telephone Encounter (Signed)
R/s appt per 2/21 sch message - pt is aware of appt date and time   

## 2018-12-24 NOTE — Telephone Encounter (Signed)
Received message from after hours service. He needs to cancel today's appt. Scheduling message sent to reschedule appts.

## 2019-01-14 ENCOUNTER — Telehealth: Payer: Self-pay | Admitting: *Deleted

## 2019-01-14 ENCOUNTER — Telehealth: Payer: Self-pay

## 2019-01-14 NOTE — Telephone Encounter (Signed)
lvm for pt to return call to discuss Dr [name] recommendations for anagrelide. Per Dr [name] pt should take 1 pill every other day until no more left.  She will see him on 3/20 to discuss different tx options.

## 2019-01-14 NOTE — Telephone Encounter (Signed)
Tell him to take it every other day until it is finished and I will see him as scheduled and review plan of care with him

## 2019-01-14 NOTE — Telephone Encounter (Signed)
Pt called and said he has 1 day dose of anagrelide left and pharmacy is on back order. He wants to know Should he decrease his dose to make med last longer. Also said you had mentioned to him an alternative medication. He sees you on 3/20. Please advise.

## 2019-01-14 NOTE — Telephone Encounter (Signed)
Voicemail received from  Ulla Potash requesting return call [phone]).  "I'm having trouble obtaining Anagrelide.  Usually use [facility] but they won't have it until mid April.  I only have a few pills left." Message forwarded to collaborative.

## 2019-01-17 ENCOUNTER — Telehealth: Payer: Self-pay

## 2019-01-17 NOTE — Telephone Encounter (Signed)
He called and left a message to call him regarding message he got to call the office Friday.  Called back. He was able to get Anagrelide Rx and now has a 5 week supply. He will take it daily until he see's Dr. [name] on 3/20.

## 2019-01-20 ENCOUNTER — Telehealth: Payer: Self-pay | Admitting: *Deleted

## 2019-01-20 NOTE — Telephone Encounter (Signed)
"  Charles Bowman [phone]) calling about tomorrow's appointment.  Are you open and I come in or what?  My temperature is checked at work daily = 96.58F yesterday."  Denies cough, s.o.b or travel.  Advised to come in as scheduled.  Expect screening before visit, [place] following virus precautions. Call in am if fever, cough or s.o.b develop.

## 2019-01-21 ENCOUNTER — Telehealth: Payer: Self-pay | Admitting: Hematology and Oncology

## 2019-01-21 ENCOUNTER — Inpatient Hospital Stay: Payer: 59 | Attending: Hematology and Oncology

## 2019-01-21 ENCOUNTER — Other Ambulatory Visit: Payer: Self-pay

## 2019-01-21 ENCOUNTER — Encounter: Payer: Self-pay | Admitting: Hematology and Oncology

## 2019-01-21 ENCOUNTER — Inpatient Hospital Stay: Payer: 59 | Admitting: Hematology and Oncology

## 2019-01-21 DIAGNOSIS — Z79899 Other long term (current) drug therapy: Secondary | ICD-10-CM | POA: Diagnosis not present

## 2019-01-21 DIAGNOSIS — Z7984 Long term (current) use of oral hypoglycemic drugs: Secondary | ICD-10-CM | POA: Diagnosis not present

## 2019-01-21 DIAGNOSIS — D473 Essential (hemorrhagic) thrombocythemia: Secondary | ICD-10-CM | POA: Insufficient documentation

## 2019-01-21 DIAGNOSIS — E119 Type 2 diabetes mellitus without complications: Secondary | ICD-10-CM

## 2019-01-21 DIAGNOSIS — I10 Essential (primary) hypertension: Secondary | ICD-10-CM

## 2019-01-21 DIAGNOSIS — E663 Overweight: Secondary | ICD-10-CM | POA: Insufficient documentation

## 2019-01-21 LAB — CBC WITH DIFFERENTIAL/PLATELET
Abs Immature Granulocytes: 0 10*3/uL (ref 0.00–0.07)
Basophils Absolute: 0 10*3/uL (ref 0.0–0.1)
Basophils Relative: 0 %
Eosinophils Absolute: 0.1 10*3/uL (ref 0.0–0.5)
Eosinophils Relative: 2 %
HCT: 45.7 % (ref 39.0–52.0)
Hemoglobin: 14.4 g/dL (ref 13.0–17.0)
Immature Granulocytes: 0 %
Lymphocytes Relative: 24 %
Lymphs Abs: 1.2 10*3/uL (ref 0.7–4.0)
MCH: 27 pg (ref 26.0–34.0)
MCHC: 31.5 g/dL (ref 30.0–36.0)
MCV: 85.6 fL (ref 80.0–100.0)
MONO ABS: 0.4 10*3/uL (ref 0.1–1.0)
Monocytes Relative: 8 %
Neutro Abs: 3.3 10*3/uL (ref 1.7–7.7)
Neutrophils Relative %: 66 %
Platelets: 215 10*3/uL (ref 150–400)
RBC: 5.34 MIL/uL (ref 4.22–5.81)
RDW: 14.6 % (ref 11.5–15.5)
WBC: 5.1 10*3/uL (ref 4.0–10.5)
nRBC: 0 % (ref 0.0–0.2)

## 2019-01-21 LAB — COMPREHENSIVE METABOLIC PANEL
ALT: 27 U/L (ref 0–44)
AST: 18 U/L (ref 15–41)
Albumin: 4 g/dL (ref 3.5–5.0)
Alkaline Phosphatase: 100 U/L (ref 38–126)
Anion gap: 9 (ref 5–15)
BUN: 21 mg/dL — ABNORMAL HIGH (ref 6–20)
CALCIUM: 9.1 mg/dL (ref 8.9–10.3)
CO2: 28 mmol/L (ref 22–32)
Chloride: 100 mmol/L (ref 98–111)
Creatinine, Ser: 1.39 mg/dL — ABNORMAL HIGH (ref 0.61–1.24)
GFR calc Af Amer: >60 mL/min (ref >60)
GFR calc non Af Amer: 57 mL/min — ABNORMAL LOW (ref >60)
Glucose, Bld: 125 mg/dL — ABNORMAL HIGH (ref 70–99)
Potassium: 4.5 mmol/L (ref 3.5–5.1)
Sodium: 137 mmol/L (ref 135–145)
Total Bilirubin: 0.5 mg/dL (ref 0.3–1.2)
Total Protein: 7.2 g/dL (ref 6.5–8.1)

## 2019-01-21 NOTE — Assessment & Plan Note (Signed)
His CBC is within normal limits Due to anticipated national shortage of anagrelide, I recommend reduce dose to 1 capsule daily and plan to have his CBC recheck in 2 weeks. If he is able to get the rest of his supply, he will resume his usual dose.  However, if he is not able to get more of his pill, I will have to bring him in every 2 to 4 weeks to monitor his platelet count carefully.  He agreed with the plan of care

## 2019-01-21 NOTE — Progress Notes (Signed)
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 If he is able to get the rest of his supply, he will resume his usual dose.  However, if he is not able to get more of his pill, I will have to bring him in every 2 to 4 weeks to monitor his platelet count carefully.  He agreed with the plan of care  Diabetes mellitus type 2, uncomplicated ([place]) The patient is overweight with diagnosis of diabetes and hypertension We discussed the importance of healthy living The patient appears to have made significant lifestyle changes with dietary modification and exercise. I congratulated his efforts   Orders Placed This Encounter  Procedures  . CBC with Differential/Platelet    Standing Status:   Standing    Number of Occurrences:   9    Standing Expiration Date:   01/21/2020    INTERVAL HISTORY: Please see below for problem oriented charting. He returns for further follow-up Due to national shortage of anagrelide, his last medication refill was not adequate to last him until next month He denies recent infection, fever or chills No bleeding He has made significant changes to his lifestyle and is attempting to lose weight.  SUMMARY OF ONCOLOGIC HISTORY:   Essential thrombocythemia ([place])   11/03/1984 Cancer Diagnosis    He was noted to have significant abnormal CBC    05/24/2003 Bone Marrow Biopsy    Case #: [id number] BM biopsy confirmed essential thrombocythemia    11/06/2003 -  Chemotherapy    He was placed on anagrelide    08/22/2016 - 08/29/2016 Hospital Admission    The patient was admitted to the hospital due to  hemoptysis. CT angiogram of the chest concerning for pneumonia. Bronchoscopy done revealed a blot clot was found in the posterior segment of the right upper lobe (B2). It was completely obstructing the airway.The clot was successfully removed, but the lesion began bleeding. BAL was performed in the RUL posterior segment (B2) of the lung and sent for bacterial, AFB and fungal analysis    08/28/2016 Procedure    He underwent selective right bronchial arteriogram was performed and the vessel was embolized      REVIEW OF SYSTEMS:   Constitutional: Denies fevers, chills or abnormal weight loss Eyes: Denies blurriness of vision Ears, nose, mouth, throat, and face: Denies mucositis or sore throat Respiratory: Denies cough, dyspnea or wheezes Cardiovascular: Denies palpitation, chest discomfort or lower extremity swelling Gastrointestinal:  Denies nausea, heartburn or change in bowel habits Skin: Denies abnormal skin rashes Lymphatics: Denies new lymphadenopathy or easy bruising Neurological:Denies numbness, tingling or new weaknesses Behavioral/Psych: Mood is stable, no new changes  All other systems were reviewed with the patient and are negative.  I have reviewed the past medical history, past surgical history, social history and family history with the patient and they are unchanged from previous note.  ALLERGIES:  has No Known Allergies.  MEDICATIONS:  Current Outpatient Medications  Medication Sig Dispense Refill  . anagrelide (AGRYLIN) 1 MG capsule Take 3 capsules (3 mg total) by mouth daily. 90 capsule 6  . atorvastatin (LIPITOR) 20 MG tablet Take 1 tablet (20 mg total)  by mouth daily. NO MORE REFILLS WITHOUT OFFICE VISIT/ LABS - 2ND NOTICE 15 tablet 0  . FARXIGA 10 MG TABS tablet TAKE ONE TABLET (10 MG DOSE) BY MOUTH DAILY.  1  . fluticasone (FLONASE) 50 MCG/ACT nasal spray PLACE 2 SPRAYS IN EACH NOSTRIL EVERY DAY 16 g 1  . glimepiride (AMARYL) 4 MG tablet TAKE 1 TABLET (4 MG TOTAL)  BY MOUTH DAILY BEFORE BREAKFAST.  "NO MORE REFILLS WITHOUT OV" (Patient taking differently: 4 mg. TAKE 1 TABLET (4 MG TOTAL) BY MOUTH DAILY BEFORE BREAKFAST.  "NO MORE REFILLS WITHOUT OV") 30 tablet 0  . glucose blood (ONE TOUCH ULTRA TEST) test strip Use as instructed 100 each 0  . hydrochlorothiazide (HYDRODIURIL) 25 MG tablet TAKE 2 TABLETS (50 MG DOSE) BY MOUTH DAILY.  1  . lisinopril (PRINIVIL,ZESTRIL) 40 MG tablet Take 1 tablet (40 mg total) by mouth daily. 30 tablet 0  . MATZIM LA 420 MG 24 hr tablet TAKE 1 TABLET (420 MG TOTAL) BY MOUTH DAILY. 15 tablet 0  . metFORMIN (GLUCOPHAGE-XR) 500 MG 24 hr tablet TAKE 4 TABLETS BY MOUTH EVERY DAY  "OFFICE VISIT NEEDED" (Patient taking differently: Take 1,000 mg by mouth 2 (two) times daily. ) 60 tablet 0  . metoprolol succinate (TOPROL-XL) 50 MG 24 hr tablet TAKE ONE TABLET (50 MG DOSE) BY MOUTH DAILY.  4  . Multiple Vitamins-Minerals (MULTIVITAMIN WITH MINERALS) tablet Take 1 tablet by mouth daily.    Marland Kitchen SYNTHROID 200 MCG tablet TAKE ONE TABLET (200 MCG TOTAL) BY MOUTH DAILY.  1  . traZODone (DESYREL) 50 MG tablet Take 50 mg by mouth at bedtime.     No current facility-administered medications for this visit.     PHYSICAL EXAMINATION: ECOG PERFORMANCE STATUS: 0 - Asymptomatic  Vitals:   01/21/19 1306  BP: (!) 154/63  Pulse: 63  Resp: 17  Temp: (!) 97.5 F (36.4 C)  SpO2: 96%   Filed Weights   01/21/19 1306  Weight: 190 lb 14.2 oz (86.6 kg)    GENERAL:alert, no distress and comfortable Musculoskeletal:no cyanosis of digits and no clubbing  NEURO: alert & oriented x 3 with fluent speech, no focal motor/sensory deficits  LABORATORY DATA:  I have reviewed the data as listed    Component Value Date/Time   NA 137 01/21/2019 1251   NA 137 12/16/2016 1328   K 4.5 01/21/2019 1251   K 4.1 12/16/2016 1328   CL 100 01/21/2019 1251   CL 105 09/17/2012 1030   CO2 28 01/21/2019 1251   CO2 24 12/16/2016 1328   GLUCOSE 125 (H) 01/21/2019  1251   GLUCOSE 154 (H) 12/16/2016 1328   GLUCOSE 140 (H) 09/17/2012 1030   BUN 21 (H) 01/21/2019 1251   BUN 14.1 12/16/2016 1328   CREATININE 1.39 (H) 01/21/2019 1251   CREATININE 1.22 12/18/2017 1427   CREATININE 0.9 12/16/2016 1328   CALCIUM 9.1 01/21/2019 1251   CALCIUM 9.9 12/16/2016 1328   PROT 7.2 01/21/2019 1251   PROT 7.3 12/16/2016 1328   ALBUMIN 4.0 01/21/2019 1251   ALBUMIN 4.0 12/16/2016 1328   AST 18 01/21/2019 1251   AST 22 12/18/2017 1427   AST 24 12/16/2016 1328   ALT 27 01/21/2019 1251   ALT 39 12/18/2017 1427   ALT 39 12/16/2016 1328   ALKPHOS 100 01/21/2019 1251   ALKPHOS 118 12/16/2016 1328   BILITOT 0.5 01/21/2019 1251   BILITOT 0.4 12/18/2017 1427   BILITOT 0.36 12/16/2016 1328   GFRNONAA 57 (  L) 01/21/2019 1251   GFRNONAA >60 12/18/2017 1427   GFRAA >60 01/21/2019 1251   GFRAA >60 12/18/2017 1427    No results found for: SPEP, UPEP  Lab Results  Component Value Date   WBC 5.1 01/21/2019   NEUTROABS 3.3 01/21/2019   HGB 14.4 01/21/2019   HCT 45.7 01/21/2019   MCV 85.6 01/21/2019   PLT 215 01/21/2019      Chemistry      Component Value Date/Time   NA 137 01/21/2019 1251   NA 137 12/16/2016 1328   K 4.5 01/21/2019 1251   K 4.1 12/16/2016 1328   CL 100 01/21/2019 1251   CL 105 09/17/2012 1030   CO2 28 01/21/2019 1251   CO2 24 12/16/2016 1328   BUN 21 (H) 01/21/2019 1251   BUN 14.1 12/16/2016 1328   CREATININE 1.39 (H) 01/21/2019 1251   CREATININE 1.22 12/18/2017 1427   CREATININE 0.9 12/16/2016 1328      Component Value Date/Time   CALCIUM 9.1 01/21/2019 1251   CALCIUM 9.9 12/16/2016 1328   ALKPHOS 100 01/21/2019 1251   ALKPHOS 118 12/16/2016 1328   AST 18 01/21/2019 1251   AST 22 12/18/2017 1427   AST 24 12/16/2016 1328   ALT 27 01/21/2019 1251   ALT 39 12/18/2017 1427   ALT 39 12/16/2016 1328   BILITOT 0.5 01/21/2019 1251   BILITOT 0.4 12/18/2017 1427   BILITOT 0.36 12/16/2016 1328      All questions were answered. The  patient knows to call the clinic with any problems, questions or concerns. No barriers to learning was detected.  I spent 10 minutes counseling the patient face to face. The total time spent in the appointment was 15 minutes and more than 50% was on counseling and review of test results  Heath Lark, MD 01/21/2019 3:44 PM

## 2019-01-21 NOTE — Telephone Encounter (Signed)
Scheduled per los, declined printout  ° °

## 2019-01-21 NOTE — Assessment & Plan Note (Signed)
The patient is overweight with diagnosis of diabetes and hypertension We discussed the importance of healthy living The patient appears to have made significant lifestyle changes with dietary modification and exercise. I congratulated his efforts

## 2019-02-04 ENCOUNTER — Other Ambulatory Visit: Payer: Self-pay

## 2019-02-04 ENCOUNTER — Inpatient Hospital Stay: Payer: 59 | Attending: Hematology and Oncology

## 2019-02-04 DIAGNOSIS — D473 Essential (hemorrhagic) thrombocythemia: Secondary | ICD-10-CM | POA: Diagnosis present

## 2019-02-04 LAB — CBC WITH DIFFERENTIAL/PLATELET
Abs Immature Granulocytes: 0.01 10*3/uL (ref 0.00–0.07)
Basophils Absolute: 0 10*3/uL (ref 0.0–0.1)
Basophils Relative: 0 %
Eosinophils Absolute: 0.2 10*3/uL (ref 0.0–0.5)
Eosinophils Relative: 3 %
HCT: 43.8 % (ref 39.0–52.0)
Hemoglobin: 14 g/dL (ref 13.0–17.0)
Immature Granulocytes: 0 %
Lymphocytes Relative: 20 %
Lymphs Abs: 1.1 10*3/uL (ref 0.7–4.0)
MCH: 27.2 pg (ref 26.0–34.0)
MCHC: 32 g/dL (ref 30.0–36.0)
MCV: 85 fL (ref 80.0–100.0)
Monocytes Absolute: 0.4 10*3/uL (ref 0.1–1.0)
Monocytes Relative: 7 %
Neutro Abs: 3.7 10*3/uL (ref 1.7–7.7)
Neutrophils Relative %: 70 %
Platelets: 394 10*3/uL (ref 150–400)
RBC: 5.15 MIL/uL (ref 4.22–5.81)
RDW: 14.6 % (ref 11.5–15.5)
WBC: 5.4 10*3/uL (ref 4.0–10.5)
nRBC: 0 % (ref 0.0–0.2)

## 2019-02-07 ENCOUNTER — Telehealth: Payer: Self-pay | Admitting: *Deleted

## 2019-02-07 NOTE — Telephone Encounter (Signed)
Patient advised of lab results as directed below. Scheduling message sent.

## 2019-02-07 NOTE — Telephone Encounter (Signed)
-----   Message from Heath Lark, MD sent at 02/07/2019  8:25 AM EDT ----- Regarding: cbc last week Pls call and let him know CBC is normal I suggest seeing him again in 6 months Please send labs and scheduling msg for 6 month

## 2019-02-10 ENCOUNTER — Encounter: Payer: Self-pay | Admitting: Hematology and Oncology

## 2019-02-10 ENCOUNTER — Telehealth: Payer: Self-pay | Admitting: Hematology and Oncology

## 2019-02-10 NOTE — Telephone Encounter (Signed)
Scheduled per sch msg. Mailed printout.  °

## 2019-02-15 ENCOUNTER — Telehealth: Payer: Self-pay | Admitting: Hematology and Oncology

## 2019-02-15 NOTE — Telephone Encounter (Signed)
Scheduled labs for end of April per sch msg. Called patient, no answer. Left msg.

## 2019-02-23 ENCOUNTER — Telehealth: Payer: Self-pay

## 2019-02-23 NOTE — Telephone Encounter (Signed)
Called [facility] and [facility] to attempt to locate 1mg  Agrylin or 1mg  anagrelide.  None of the local pharmacies have any in stock. Called pt he says he has enough for 30 days (30 pils).   He has lab appt on 4/30.

## 2019-03-03 ENCOUNTER — Telehealth: Payer: Self-pay

## 2019-03-03 ENCOUNTER — Inpatient Hospital Stay: Payer: 59

## 2019-03-03 ENCOUNTER — Other Ambulatory Visit: Payer: Self-pay

## 2019-03-03 DIAGNOSIS — D473 Essential (hemorrhagic) thrombocythemia: Secondary | ICD-10-CM | POA: Diagnosis not present

## 2019-03-03 LAB — CBC WITH DIFFERENTIAL/PLATELET
Abs Immature Granulocytes: 0.02 10*3/uL (ref 0.00–0.07)
Basophils Absolute: 0 10*3/uL (ref 0.0–0.1)
Basophils Relative: 0 %
Eosinophils Absolute: 0.1 10*3/uL (ref 0.0–0.5)
Eosinophils Relative: 2 %
HCT: 45.9 % (ref 39.0–52.0)
Hemoglobin: 14.8 g/dL (ref 13.0–17.0)
Immature Granulocytes: 0 %
Lymphocytes Relative: 22 %
Lymphs Abs: 1.3 10*3/uL (ref 0.7–4.0)
MCH: 26.9 pg (ref 26.0–34.0)
MCHC: 32.2 g/dL (ref 30.0–36.0)
MCV: 83.5 fL (ref 80.0–100.0)
Monocytes Absolute: 0.5 10*3/uL (ref 0.1–1.0)
Monocytes Relative: 8 %
Neutro Abs: 3.9 10*3/uL (ref 1.7–7.7)
Neutrophils Relative %: 68 %
Platelets: 294 10*3/uL (ref 150–400)
RBC: 5.5 MIL/uL (ref 4.22–5.81)
RDW: 14.6 % (ref 11.5–15.5)
WBC: 5.8 10*3/uL (ref 4.0–10.5)
nRBC: 0 % (ref 0.0–0.2)

## 2019-03-03 NOTE — Telephone Encounter (Signed)
-----   Message from Heath Lark, MD sent at 03/03/2019  2:20 PM EDT ----- Regarding: platelet count is stable Despite reducing anagrelide, his labs are stable/normal I suggest he continues on reduced dose anagrelide If he wants repeat labs again in the future, we can set up monthly lab check only next 6 months until I see him back

## 2019-03-03 NOTE — Telephone Encounter (Signed)
Called and given below message. He verbalized understanding. He would like monthly lab checks. Scheduling message sent for month labs only.

## 2019-03-04 ENCOUNTER — Telehealth: Payer: Self-pay | Admitting: Hematology and Oncology

## 2019-03-04 NOTE — Telephone Encounter (Signed)
Scheduled monthly labs per sch msg.

## 2019-03-23 MED FILL — ANAGRELIDE HCL 0.5 MG CAP: 0.5 | 30 days supply | Qty: 180 | Fill #0

## 2019-03-24 ENCOUNTER — Encounter: Payer: Self-pay | Admitting: Hematology and Oncology

## 2019-04-01 ENCOUNTER — Inpatient Hospital Stay: Payer: 59 | Attending: Hematology and Oncology

## 2019-04-01 ENCOUNTER — Other Ambulatory Visit: Payer: Self-pay

## 2019-04-01 DIAGNOSIS — D473 Essential (hemorrhagic) thrombocythemia: Secondary | ICD-10-CM | POA: Insufficient documentation

## 2019-04-01 LAB — CBC WITH DIFFERENTIAL/PLATELET
Abs Immature Granulocytes: 0.03 10*3/uL (ref 0.00–0.07)
Basophils Absolute: 0 10*3/uL (ref 0.0–0.1)
Basophils Relative: 0 %
Eosinophils Absolute: 0.2 10*3/uL (ref 0.0–0.5)
Eosinophils Relative: 3 %
HCT: 45.7 % (ref 39.0–52.0)
Hemoglobin: 14.4 g/dL (ref 13.0–17.0)
Immature Granulocytes: 1 %
Lymphocytes Relative: 25 %
Lymphs Abs: 1.3 10*3/uL (ref 0.7–4.0)
MCH: 26.9 pg (ref 26.0–34.0)
MCHC: 31.5 g/dL (ref 30.0–36.0)
MCV: 85.4 fL (ref 80.0–100.0)
Monocytes Absolute: 0.5 10*3/uL (ref 0.1–1.0)
Monocytes Relative: 9 %
Neutro Abs: 3.2 10*3/uL (ref 1.7–7.7)
Neutrophils Relative %: 62 %
Platelets: 348 10*3/uL (ref 150–400)
RBC: 5.35 MIL/uL (ref 4.22–5.81)
RDW: 14.8 % (ref 11.5–15.5)
WBC: 5.1 10*3/uL (ref 4.0–10.5)
nRBC: 0 % (ref 0.0–0.2)

## 2019-04-14 ENCOUNTER — Encounter: Payer: Self-pay | Admitting: Hematology and Oncology

## 2019-04-18 ENCOUNTER — Other Ambulatory Visit: Payer: Self-pay

## 2019-04-18 MED ORDER — ANAGRELIDE HCL 1 MG PO CAPS: 3.0000 mg | ORAL_CAPSULE | Freq: Every day | ORAL | 6 refills | Status: DC

## 2019-04-19 ENCOUNTER — Telehealth: Payer: Self-pay | Admitting: *Deleted

## 2019-04-19 NOTE — Telephone Encounter (Signed)
Spoke to Charles Bowman at [facility]. They are unable to fill such a large quantity at this time. Contacted patient and advised for now we would only be able to supply him with one month at a time.

## 2019-04-19 NOTE — Telephone Encounter (Signed)
Due to national shortage, we have to ration the supply Please call [facility] to check first

## 2019-04-19 NOTE — Telephone Encounter (Signed)
Patient called requesting his anagrelide prescription that was sent to [facility] be increased to a 90 day supply. He is concerned about losing his insurance, he is currently on unemployment due to the virus.  I will call pharmacy to increase quantity if ok.

## 2019-04-21 MED FILL — ANAGRELIDE HCL 1 MG CAPSULE: 1 | 30 days supply | Qty: 90 | Fill #0

## 2019-04-29 ENCOUNTER — Encounter: Payer: Self-pay | Admitting: Hematology and Oncology

## 2019-04-29 ENCOUNTER — Other Ambulatory Visit: Payer: Self-pay

## 2019-04-29 ENCOUNTER — Inpatient Hospital Stay: Payer: 59 | Attending: Hematology and Oncology

## 2019-04-29 DIAGNOSIS — D473 Essential (hemorrhagic) thrombocythemia: Secondary | ICD-10-CM | POA: Insufficient documentation

## 2019-04-29 LAB — CBC WITH DIFFERENTIAL/PLATELET
Abs Immature Granulocytes: 0.01 10*3/uL (ref 0.00–0.07)
Basophils Absolute: 0 10*3/uL (ref 0.0–0.1)
Basophils Relative: 0 %
Eosinophils Absolute: 0.1 10*3/uL (ref 0.0–0.5)
Eosinophils Relative: 3 %
HCT: 44.8 % (ref 39.0–52.0)
Hemoglobin: 14.5 g/dL (ref 13.0–17.0)
Immature Granulocytes: 0 %
Lymphocytes Relative: 19 %
Lymphs Abs: 0.9 10*3/uL (ref 0.7–4.0)
MCH: 27.4 pg (ref 26.0–34.0)
MCHC: 32.4 g/dL (ref 30.0–36.0)
MCV: 84.7 fL (ref 80.0–100.0)
Monocytes Absolute: 0.4 10*3/uL (ref 0.1–1.0)
Monocytes Relative: 7 %
Neutro Abs: 3.3 10*3/uL (ref 1.7–7.7)
Neutrophils Relative %: 71 %
Platelets: 247 10*3/uL (ref 150–400)
RBC: 5.29 MIL/uL (ref 4.22–5.81)
RDW: 14.9 % (ref 11.5–15.5)
WBC: 4.7 10*3/uL (ref 4.0–10.5)
nRBC: 0 % (ref 0.0–0.2)

## 2019-05-16 MED FILL — ANAGRELIDE HCL 1 MG CAPSULE: 1 | 30 days supply | Qty: 90 | Fill #1

## 2019-06-03 ENCOUNTER — Other Ambulatory Visit: Payer: 59

## 2019-07-01 ENCOUNTER — Other Ambulatory Visit: Payer: 59

## 2019-07-05 MED FILL — ANAGRELIDE HCL 1 MG CAPSULE: 1 | 30 days supply | Qty: 90 | Fill #2

## 2019-07-29 ENCOUNTER — Other Ambulatory Visit: Payer: 59

## 2019-08-11 ENCOUNTER — Telehealth: Payer: Self-pay

## 2019-08-11 NOTE — Telephone Encounter (Signed)
Received after hours message that he needs to cancel tomorrow's appt. He is unable to get off work.   Called back and left a message appt's canceled and scheduling message sent to reschedule.

## 2019-08-12 ENCOUNTER — Inpatient Hospital Stay: Payer: 59

## 2019-08-12 ENCOUNTER — Inpatient Hospital Stay: Payer: 59 | Admitting: Hematology and Oncology

## 2019-08-22 ENCOUNTER — Inpatient Hospital Stay (HOSPITAL_BASED_OUTPATIENT_CLINIC_OR_DEPARTMENT_OTHER): Payer: 59 | Admitting: Hematology and Oncology

## 2019-08-22 ENCOUNTER — Other Ambulatory Visit: Payer: Self-pay

## 2019-08-22 ENCOUNTER — Inpatient Hospital Stay: Payer: 59 | Attending: Hematology and Oncology

## 2019-08-22 ENCOUNTER — Encounter: Payer: Self-pay | Admitting: Hematology and Oncology

## 2019-08-22 DIAGNOSIS — D473 Essential (hemorrhagic) thrombocythemia: Secondary | ICD-10-CM

## 2019-08-22 DIAGNOSIS — I10 Essential (primary) hypertension: Secondary | ICD-10-CM | POA: Diagnosis not present

## 2019-08-22 LAB — CBC WITH DIFFERENTIAL/PLATELET
Abs Immature Granulocytes: 0.01 10*3/uL (ref 0.00–0.07)
Basophils Absolute: 0 10*3/uL (ref 0.0–0.1)
Basophils Relative: 0 %
Eosinophils Absolute: 0.1 10*3/uL (ref 0.0–0.5)
Eosinophils Relative: 3 %
HCT: 46.5 % (ref 39.0–52.0)
Hemoglobin: 15.5 g/dL (ref 13.0–17.0)
Immature Granulocytes: 0 %
Lymphocytes Relative: 23 %
Lymphs Abs: 1.1 10*3/uL (ref 0.7–4.0)
MCH: 28.3 pg (ref 26.0–34.0)
MCHC: 33.3 g/dL (ref 30.0–36.0)
MCV: 84.9 fL (ref 80.0–100.0)
Monocytes Absolute: 0.4 10*3/uL (ref 0.1–1.0)
Monocytes Relative: 8 %
Neutro Abs: 3.2 10*3/uL (ref 1.7–7.7)
Neutrophils Relative %: 66 %
Platelets: 222 10*3/uL (ref 150–400)
RBC: 5.48 MIL/uL (ref 4.22–5.81)
RDW: 14.1 % (ref 11.5–15.5)
WBC: 4.8 10*3/uL (ref 4.0–10.5)
nRBC: 0 % (ref 0.0–0.2)

## 2019-08-22 MED FILL — ANAGRELIDE HCL 1 MG CAPS: 1 | 30 days supply | Qty: 90 | Fill #3

## 2019-08-22 NOTE — Progress Notes (Signed)
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   INTERVAL HISTORY: Please see below for problem oriented charting. He returns for further follow-up He is doing well No recent infection, fever or chills No recent bleeding  SUMMARY OF ONCOLOGIC HISTORY: Oncology History  Essential thrombocythemia ([place])  11/03/1984 Cancer Diagnosis   He was noted to have significant abnormal CBC   05/24/2003 Bone Marrow Biopsy   Case #: [id number] BM biopsy confirmed essential thrombocythemia   11/06/2003 -  Chemotherapy   He was placed on anagrelide   08/22/2016 - 08/29/2016 Hospital Admission   The patient was admitted to the hospital due to hemoptysis. CT angiogram of the chest concerning for pneumonia. Bronchoscopy done revealed a blot clot was found in the posterior segment of the right upper lobe (B2). It was completely obstructing the airway.The clot was successfully removed, but the lesion began bleeding. BAL was performed in the RUL posterior segment (B2) of the lung and sent for bacterial, AFB and fungal analysis   08/28/2016 Procedure   He underwent selective right bronchial arteriogram was performed and the vessel was embolized      REVIEW OF SYSTEMS:   Constitutional: Denies  fevers, chills or abnormal weight loss Eyes: Denies blurriness of vision Ears, nose, mouth, throat, and face: Denies mucositis or sore throat Respiratory: Denies cough, dyspnea or wheezes Cardiovascular: Denies palpitation, chest discomfort or lower extremity swelling Gastrointestinal:  Denies nausea, heartburn or change in bowel habits Skin: Denies abnormal skin rashes Lymphatics: Denies new lymphadenopathy or easy bruising Neurological:Denies numbness, tingling or new weaknesses Behavioral/Psych: Mood is stable, no new changes  All other systems were reviewed with the patient and are negative.  I have reviewed the past medical history, past surgical history, social history and family history with the patient and they are unchanged from previous note.  ALLERGIES:  has No Known Allergies.  MEDICATIONS:  Current Outpatient Medications  Medication Sig Dispense Refill  . anagrelide (AGRYLIN) 1 MG capsule Take 3 capsules (3 mg total) by mouth daily. 90 capsule 6  . atorvastatin (LIPITOR) 20 MG tablet Take 1 tablet (20 mg total) by mouth daily. NO MORE REFILLS WITHOUT OFFICE VISIT/ LABS - 2ND NOTICE 15 tablet 0  . FARXIGA 10 MG TABS tablet TAKE ONE TABLET (10 MG DOSE) BY MOUTH DAILY.  1  . fluticasone (FLONASE) 50 MCG/ACT nasal spray PLACE 2 SPRAYS IN EACH NOSTRIL EVERY DAY 16 g 1  . glimepiride (AMARYL) 4 MG tablet TAKE 1 TABLET (4 MG TOTAL) BY MOUTH DAILY BEFORE BREAKFAST.  "NO MORE REFILLS WITHOUT OV" (Patient taking differently: 4 mg. TAKE 1 TABLET (4 MG TOTAL) BY MOUTH DAILY BEFORE BREAKFAST.  "NO MORE REFILLS WITHOUT OV") 30 tablet 0  . glucose blood (ONE TOUCH ULTRA TEST) test strip Use as instructed 100 each 0  .  hydrochlorothiazide (HYDRODIURIL) 25 MG tablet TAKE 2 TABLETS (50 MG DOSE) BY MOUTH DAILY.  1  . lisinopril (PRINIVIL,ZESTRIL) 40 MG tablet Take 1 tablet (40 mg total) by mouth daily. 30 tablet 0  . MATZIM LA 420 MG 24 hr tablet TAKE 1 TABLET (420 MG TOTAL) BY MOUTH DAILY. 15  tablet 0  . metFORMIN (GLUCOPHAGE-XR) 500 MG 24 hr tablet TAKE 4 TABLETS BY MOUTH EVERY DAY  "OFFICE VISIT NEEDED" (Patient taking differently: Take 1,000 mg by mouth 2 (two) times daily. ) 60 tablet 0  . metoprolol succinate (TOPROL-XL) 50 MG 24 hr tablet TAKE ONE TABLET (50 MG DOSE) BY MOUTH DAILY.  4  . Multiple Vitamins-Minerals (MULTIVITAMIN WITH MINERALS) tablet Take 1 tablet by mouth daily.    Marland Kitchen SYNTHROID 200 MCG tablet TAKE ONE TABLET (200 MCG TOTAL) BY MOUTH DAILY.  1  . traZODone (DESYREL) 50 MG tablet Take 50 mg by mouth at bedtime.     No current facility-administered medications for this visit.     PHYSICAL EXAMINATION: ECOG PERFORMANCE STATUS: 0 - Asymptomatic  Vitals:   08/22/19 1151  BP: (!) 173/77  Pulse: 77  Resp: 18  Temp: 98.2 F (36.8 C)  SpO2: 97%   Filed Weights   08/22/19 1151  Weight: 194 lb 12.8 oz (88.4 kg)    GENERAL:alert, no distress and comfortable NEURO: alert & oriented x 3 with fluent speech, no focal motor/sensory deficits  LABORATORY DATA:  I have reviewed the data as listed    Component Value Date/Time   NA 137 01/21/2019 1251   NA 137 12/16/2016 1328   K 4.5 01/21/2019 1251   K 4.1 12/16/2016 1328   CL 100 01/21/2019 1251   CL 105 09/17/2012 1030   CO2 28 01/21/2019 1251   CO2 24 12/16/2016 1328   GLUCOSE 125 (H) 01/21/2019 1251   GLUCOSE 154 (H) 12/16/2016 1328   GLUCOSE 140 (H) 09/17/2012 1030   BUN 21 (H) 01/21/2019 1251   BUN 14.1 12/16/2016 1328   CREATININE 1.39 (H) 01/21/2019 1251   CREATININE 1.22 12/18/2017 1427   CREATININE 0.9 12/16/2016 1328   CALCIUM 9.1 01/21/2019 1251   CALCIUM 9.9 12/16/2016 1328   PROT 7.2 01/21/2019 1251   PROT 7.3 12/16/2016 1328   ALBUMIN 4.0 01/21/2019 1251   ALBUMIN 4.0 12/16/2016 1328   AST 18 01/21/2019 1251   AST 22 12/18/2017 1427   AST 24 12/16/2016 1328   ALT 27 01/21/2019 1251   ALT 39 12/18/2017 1427   ALT 39 12/16/2016 1328   ALKPHOS 100 01/21/2019 1251   ALKPHOS 118  12/16/2016 1328   BILITOT 0.5 01/21/2019 1251   BILITOT 0.4 12/18/2017 1427   BILITOT 0.36 12/16/2016 1328   GFRNONAA 57 (L) 01/21/2019 1251   GFRNONAA >60 12/18/2017 1427   GFRAA >60 01/21/2019 1251   GFRAA >60 12/18/2017 1427    No results found for: SPEP, UPEP  Lab Results  Component Value Date   WBC 4.8 08/22/2019   NEUTROABS 3.2 08/22/2019   HGB 15.5 08/22/2019   HCT 46.5 08/22/2019   MCV 84.9 08/22/2019   PLT 222 08/22/2019      Chemistry      Component Value Date/Time   NA 137 01/21/2019 1251   NA 137 12/16/2016 1328   K 4.5 01/21/2019 1251   K 4.1 12/16/2016 1328   CL 100 01/21/2019 1251   CL 105 09/17/2012 1030   CO2 28 01/21/2019 1251   CO2 24 12/16/2016 1328  BUN 21 (H) 01/21/2019 1251   BUN 14.1 12/16/2016 1328   CREATININE 1.39 (H) 01/21/2019 1251   CREATININE 1.22 12/18/2017 1427   CREATININE 0.9 12/16/2016 1328      Component Value Date/Time   CALCIUM 9.1 01/21/2019 1251   CALCIUM 9.9 12/16/2016 1328   ALKPHOS 100 01/21/2019 1251   ALKPHOS 118 12/16/2016 1328   AST 18 01/21/2019 1251   AST 22 12/18/2017 1427   AST 24 12/16/2016 1328   ALT 27 01/21/2019 1251   ALT 39 12/18/2017 1427   ALT 39 12/16/2016 1328   BILITOT 0.5 01/21/2019 1251   BILITOT 0.4 12/18/2017 1427   BILITOT 0.36 12/16/2016 1328     All questions were answered. The patient knows to call the clinic with any problems, questions or concerns. No barriers to learning was detected.  I spent 10 minutes counseling the patient face to face. The total time spent in the appointment was 15 minutes and more than 50% was on counseling and review of test results  Heath Lark, MD 08/22/2019 12:05 PM

## 2019-08-22 NOTE — Assessment & Plan Note (Signed)
His blood pressure is elevated, likely due to whitecoat hypertension According to the patient, his blood pressure checked recently was within normal range He will continue medical management through his primary care doctor

## 2019-08-22 NOTE — Assessment & Plan Note (Signed)
His CBC is within normal limits He tolerated treatment well If his CBC remained stable, I plan to reduce anagrelide to 2 mg permanently in the future I will see him again in 6 months for further follow-up

## 2019-08-24 ENCOUNTER — Telehealth: Payer: Self-pay | Admitting: Hematology and Oncology

## 2019-08-24 NOTE — Telephone Encounter (Signed)
Scheduled appt per 10/19 sch message - mailed reminder letter with appt date and time  ° °

## 2019-11-02 MED FILL — ANAGRELIDE HCL 1 MG CAPS: 1 | 30 days supply | Qty: 90 | Fill #4

## 2020-01-09 MED FILL — ANAGRELIDE HCL 1 MG CAPSULE: 1 | 30 days supply | Qty: 90 | Fill #5

## 2020-02-20 ENCOUNTER — Ambulatory Visit: Payer: 59 | Admitting: Hematology and Oncology

## 2020-02-20 ENCOUNTER — Other Ambulatory Visit: Payer: 59

## 2020-02-24 MED FILL — ANAGRELIDE HCL 1 MG CAPSULE: 1 | 30 days supply | Qty: 90 | Fill #6

## 2020-03-02 ENCOUNTER — Inpatient Hospital Stay (HOSPITAL_BASED_OUTPATIENT_CLINIC_OR_DEPARTMENT_OTHER): Payer: No Typology Code available for payment source | Admitting: Hematology and Oncology

## 2020-03-02 ENCOUNTER — Encounter: Payer: Self-pay | Admitting: Hematology and Oncology

## 2020-03-02 ENCOUNTER — Inpatient Hospital Stay: Payer: No Typology Code available for payment source | Attending: Hematology and Oncology

## 2020-03-02 ENCOUNTER — Other Ambulatory Visit: Payer: Self-pay

## 2020-03-02 DIAGNOSIS — Z79899 Other long term (current) drug therapy: Secondary | ICD-10-CM | POA: Diagnosis not present

## 2020-03-02 DIAGNOSIS — I10 Essential (primary) hypertension: Secondary | ICD-10-CM

## 2020-03-02 DIAGNOSIS — D473 Essential (hemorrhagic) thrombocythemia: Secondary | ICD-10-CM | POA: Diagnosis not present

## 2020-03-02 DIAGNOSIS — E119 Type 2 diabetes mellitus without complications: Secondary | ICD-10-CM | POA: Insufficient documentation

## 2020-03-02 DIAGNOSIS — R634 Abnormal weight loss: Secondary | ICD-10-CM | POA: Diagnosis not present

## 2020-03-02 LAB — CBC WITH DIFFERENTIAL/PLATELET
Abs Immature Granulocytes: 0 10*3/uL (ref 0.00–0.07)
Basophils Absolute: 0 10*3/uL (ref 0.0–0.1)
Basophils Relative: 0 %
Eosinophils Absolute: 0.1 10*3/uL (ref 0.0–0.5)
Eosinophils Relative: 1 %
HCT: 47.4 % (ref 39.0–52.0)
Hemoglobin: 15.4 g/dL (ref 13.0–17.0)
Immature Granulocytes: 0 %
Lymphocytes Relative: 26 %
Lymphs Abs: 1.2 10*3/uL (ref 0.7–4.0)
MCH: 27.9 pg (ref 26.0–34.0)
MCHC: 32.5 g/dL (ref 30.0–36.0)
MCV: 86 fL (ref 80.0–100.0)
Monocytes Absolute: 0.4 10*3/uL (ref 0.1–1.0)
Monocytes Relative: 8 %
Neutro Abs: 2.9 10*3/uL (ref 1.7–7.7)
Neutrophils Relative %: 65 %
Platelets: 223 10*3/uL (ref 150–400)
RBC: 5.51 MIL/uL (ref 4.22–5.81)
RDW: 14 % (ref 11.5–15.5)
WBC: 4.5 10*3/uL (ref 4.0–10.5)
nRBC: 0 % (ref 0.0–0.2)

## 2020-03-02 MED ORDER — ANAGRELIDE HCL 1 MG PO CAPS: 2.0000 mg | ORAL_CAPSULE | Freq: Every day | ORAL | 6 refills | Status: DC

## 2020-03-02 NOTE — Assessment & Plan Note (Signed)
His CBC is within normal limits He tolerated treatment well Since we have reduced anagrelide to 2 mg, his platelet remained stable I suspect it has something to do with his healthier body state with recent weight loss I plan to see him again in 6 months for further follow-up I am hopeful that we might be able to reduce his dose of anagrelide further in the future

## 2020-03-02 NOTE — Assessment & Plan Note (Signed)
He is currently taking blood pressure medicine through his primary care doctor With his recent weight loss, his blood pressure control has improved I congratulated his weight loss effort and continues to encourage him through dietary modification and weight loss

## 2020-03-02 NOTE — Progress Notes (Signed)
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[facility]) His CBC is within normal limits He tolerated treatment well Since we have reduced anagrelide to 2 mg, his platelet remained stable I suspect it has something to do with his healthier body state with recent weight loss I plan to see him again in 6 months for further follow-up I am hopeful that we might be able to reduce his dose of anagrelide further in the future  Essential hypertension He is currently taking blood pressure medicine through his primary care doctor With his recent weight loss, his blood pressure control has improved I congratulated his weight loss effort and continues to encourage him through dietary modification and weight loss   No orders of the defined types were placed in this encounter.   All questions were answered. The patient knows to call the clinic with any problems, questions or concerns. The total time spent in the appointment was 10 minutes encounter with patients including review of chart and various tests results, discussions about plan of care and coordination of care plan   Heath Lark, MD 03/02/2020 6:05 PM  INTERVAL HISTORY: Please see below for problem oriented charting. He returns for further follow-up on essential thrombocythemia He is doing well No recent infection, fever or chills He has lost over 15 pounds of weight through dietary modification and exercise He came off multiple different diabetes medicine recently  SUMMARY OF ONCOLOGIC HISTORY: Oncology History  Essential thrombocythemia ([place])  11/03/1984 Cancer Diagnosis   He was noted to have significant abnormal CBC   05/24/2003 Bone Marrow Biopsy   Case #: [id number] BM biopsy confirmed essential thrombocythemia   11/06/2003 -  Chemotherapy    He was placed on anagrelide   08/22/2016 - 08/29/2016 Hospital Admission   The patient was admitted to the hospital due to hemoptysis. CT angiogram of the chest concerning for pneumonia. Bronchoscopy done revealed a blot clot was found in the posterior segment of the right upper lobe (B2). It was completely obstructing the airway.The clot was successfully removed, but the lesion began bleeding. BAL was performed in the RUL posterior segment (B2) of the lung and sent for bacterial, AFB and fungal analysis   08/28/2016 Procedure   He underwent selective right bronchial arteriogram was performed and the vessel was embolized      REVIEW OF SYSTEMS:   Constitutional: Denies fevers, chills or abnormal weight loss Eyes: Denies blurriness of vision Ears, nose, mouth, throat, and face: Denies mucositis or sore throat Respiratory: Denies cough, dyspnea or wheezes Cardiovascular: Denies palpitation, chest discomfort or lower extremity swelling Gastrointestinal:  Denies nausea, heartburn or change in bowel habits Skin: Denies abnormal skin rashes Lymphatics: Denies new lymphadenopathy or easy bruising Neurological:Denies numbness, tingling or new weaknesses Behavioral/Psych: Mood is stable, no new changes  All other systems were reviewed with the patient and are negative.  I have reviewed the past medical history, past surgical history, social history and family history with the patient and they are unchanged from previous note.  ALLERGIES:  has No Known Allergies.  MEDICATIONS:  Current Outpatient Medications  Medication Sig Dispense Refill  . anagrelide (AGRYLIN) 1 MG capsule Take 2 capsules (2 mg total) by mouth daily. 90 capsule 6  . atorvastatin (LIPITOR) 20 MG tablet Take 1 tablet (20 mg total) by mouth daily. NO MORE  REFILLS WITHOUT OFFICE VISIT/ LABS - 2ND NOTICE 15 tablet 0  . fluticasone (FLONASE) 50 MCG/ACT nasal spray PLACE 2 SPRAYS IN EACH NOSTRIL EVERY DAY 16 g 1  . glucose blood  (ONE TOUCH ULTRA TEST) test strip Use as instructed 100 each 0  . hydrochlorothiazide (HYDRODIURIL) 25 MG tablet TAKE 2 TABLETS (50 MG DOSE) BY MOUTH DAILY.  1  . lisinopril (PRINIVIL,ZESTRIL) 40 MG tablet Take 1 tablet (40 mg total) by mouth daily. 30 tablet 0  . MATZIM LA 420 MG 24 hr tablet TAKE 1 TABLET (420 MG TOTAL) BY MOUTH DAILY. 15 tablet 0  . metoprolol succinate (TOPROL-XL) 50 MG 24 hr tablet TAKE ONE TABLET (50 MG DOSE) BY MOUTH DAILY.  4  . Multiple Vitamins-Minerals (MULTIVITAMIN WITH MINERALS) tablet Take 1 tablet by mouth daily.    Marland Kitchen SYNTHROID 200 MCG tablet TAKE ONE TABLET (200 MCG TOTAL) BY MOUTH DAILY.  1  . traZODone (DESYREL) 50 MG tablet Take 50 mg by mouth at bedtime.     No current facility-administered medications for this visit.    PHYSICAL EXAMINATION: ECOG PERFORMANCE STATUS: 0 - Asymptomatic  Vitals:   03/02/20 1454  BP: (!) 151/68  Pulse: (!) 55  Resp: 18  Temp: 98 F (36.7 C)  SpO2: 97%   Filed Weights   03/02/20 1454  Weight: 179 lb 8 oz (81.4 kg)    GENERAL:alert, no distress and comfortable  NEURO: alert & oriented x 3 with fluent speech, no focal motor/sensory deficits  LABORATORY DATA:  I have reviewed the data as listed    Component Value Date/Time   NA 137 01/21/2019 1251   NA 137 12/16/2016 1328   K 4.5 01/21/2019 1251   K 4.1 12/16/2016 1328   CL 100 01/21/2019 1251   CL 105 09/17/2012 1030   CO2 28 01/21/2019 1251   CO2 24 12/16/2016 1328   GLUCOSE 125 (H) 01/21/2019 1251   GLUCOSE 154 (H) 12/16/2016 1328   GLUCOSE 140 (H) 09/17/2012 1030   BUN 21 (H) 01/21/2019 1251   BUN 14.1 12/16/2016 1328   CREATININE 1.39 (H) 01/21/2019 1251   CREATININE 1.22 12/18/2017 1427   CREATININE 0.9 12/16/2016 1328   CALCIUM 9.1 01/21/2019 1251   CALCIUM 9.9 12/16/2016 1328   PROT 7.2 01/21/2019 1251   PROT 7.3 12/16/2016 1328   ALBUMIN 4.0 01/21/2019 1251   ALBUMIN 4.0 12/16/2016 1328   AST 18 01/21/2019 1251   AST 22 12/18/2017 1427    AST 24 12/16/2016 1328   ALT 27 01/21/2019 1251   ALT 39 12/18/2017 1427   ALT 39 12/16/2016 1328   ALKPHOS 100 01/21/2019 1251   ALKPHOS 118 12/16/2016 1328   BILITOT 0.5 01/21/2019 1251   BILITOT 0.4 12/18/2017 1427   BILITOT 0.36 12/16/2016 1328   GFRNONAA 57 (L) 01/21/2019 1251   GFRNONAA >60 12/18/2017 1427   GFRAA >60 01/21/2019 1251   GFRAA >60 12/18/2017 1427    No results found for: SPEP, UPEP  Lab Results  Component Value Date   WBC 4.5 03/02/2020   NEUTROABS 2.9 03/02/2020   HGB 15.4 03/02/2020   HCT 47.4 03/02/2020   MCV 86.0 03/02/2020   PLT 223 03/02/2020      Chemistry      Component Value Date/Time   NA 137 01/21/2019 1251   NA 137 12/16/2016 1328   K 4.5 01/21/2019 1251   K 4.1 12/16/2016 1328   CL 100 01/21/2019 1251   CL 105 09/17/2012  1030   CO2 28 01/21/2019 1251   CO2 24 12/16/2016 1328   BUN 21 (H) 01/21/2019 1251   BUN 14.1 12/16/2016 1328   CREATININE 1.39 (H) 01/21/2019 1251   CREATININE 1.22 12/18/2017 1427   CREATININE 0.9 12/16/2016 1328      Component Value Date/Time   CALCIUM 9.1 01/21/2019 1251   CALCIUM 9.9 12/16/2016 1328   ALKPHOS 100 01/21/2019 1251   ALKPHOS 118 12/16/2016 1328   AST 18 01/21/2019 1251   AST 22 12/18/2017 1427   AST 24 12/16/2016 1328   ALT 27 01/21/2019 1251   ALT 39 12/18/2017 1427   ALT 39 12/16/2016 1328   BILITOT 0.5 01/21/2019 1251   BILITOT 0.4 12/18/2017 1427   BILITOT 0.36 12/16/2016 1328

## 2020-03-05 ENCOUNTER — Telehealth: Payer: Self-pay | Admitting: Hematology and Oncology

## 2020-03-05 NOTE — Telephone Encounter (Signed)
Scheduled per 4/30. Unable to reach pt. Left voicemail scheduled times. 

## 2020-04-06 ENCOUNTER — Other Ambulatory Visit: Payer: Self-pay | Admitting: Hematology and Oncology

## 2020-04-06 MED FILL — ANAGRELIDE HCL 1 MG CAPSULE: 1 | 30 days supply | Qty: 60 | Fill #0

## 2020-05-08 MED FILL — ANAGRELIDE HCL 1 MG CAPSULE: 1 | 30 days supply | Qty: 60 | Fill #1

## 2020-06-13 MED FILL — ANAGRELIDE HCL 1 MG CAPSULE: 1 | 30 days supply | Qty: 60 | Fill #2

## 2020-07-17 MED FILL — ANAGRELIDE HCL 1 MG CAPSULE: 1 | 30 days supply | Qty: 60 | Fill #3

## 2020-08-22 MED FILL — ANAGRELIDE HCL 1 MG CAPSULE: 1 | 30 days supply | Qty: 60 | Fill #4

## 2020-08-31 ENCOUNTER — Inpatient Hospital Stay
Payer: No Typology Code available for payment source | Attending: Hematology and Oncology | Admitting: Hematology and Oncology

## 2020-08-31 ENCOUNTER — Encounter: Payer: Self-pay | Admitting: Hematology and Oncology

## 2020-08-31 ENCOUNTER — Other Ambulatory Visit: Payer: Self-pay

## 2020-08-31 ENCOUNTER — Inpatient Hospital Stay: Payer: No Typology Code available for payment source

## 2020-08-31 DIAGNOSIS — D473 Essential (hemorrhagic) thrombocythemia: Secondary | ICD-10-CM | POA: Diagnosis present

## 2020-08-31 DIAGNOSIS — I10 Essential (primary) hypertension: Secondary | ICD-10-CM

## 2020-08-31 DIAGNOSIS — Z299 Encounter for prophylactic measures, unspecified: Secondary | ICD-10-CM | POA: Insufficient documentation

## 2020-08-31 LAB — CBC WITH DIFFERENTIAL/PLATELET
Abs Immature Granulocytes: 0.01 10*3/uL (ref 0.00–0.07)
Basophils Absolute: 0 10*3/uL (ref 0.0–0.1)
Basophils Relative: 0 %
Eosinophils Absolute: 0.1 10*3/uL (ref 0.0–0.5)
Eosinophils Relative: 3 %
HCT: 43.5 % (ref 39.0–52.0)
Hemoglobin: 14.8 g/dL (ref 13.0–17.0)
Immature Granulocytes: 0 %
Lymphocytes Relative: 25 %
Lymphs Abs: 1.2 10*3/uL (ref 0.7–4.0)
MCH: 28.4 pg (ref 26.0–34.0)
MCHC: 34 g/dL (ref 30.0–36.0)
MCV: 83.5 fL (ref 80.0–100.0)
Monocytes Absolute: 0.4 10*3/uL (ref 0.1–1.0)
Monocytes Relative: 8 %
Neutro Abs: 3 10*3/uL (ref 1.7–7.7)
Neutrophils Relative %: 64 %
Platelets: 202 10*3/uL (ref 150–400)
RBC: 5.21 MIL/uL (ref 4.22–5.81)
RDW: 13.4 % (ref 11.5–15.5)
WBC: 4.6 10*3/uL (ref 4.0–10.5)
nRBC: 0 % (ref 0.0–0.2)

## 2020-08-31 MED ORDER — ANAGRELIDE HCL 1 MG PO CAPS: 1.0000 mg | ORAL_CAPSULE | Freq: Every day | ORAL | 6 refills | Status: DC

## 2020-08-31 NOTE — Progress Notes (Signed)
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) His CBC is within normal limits He tolerated treatment well Since we have reduced anagrelide to 2 mg, his platelet remained stable With stability of his platelet count, I recommend reducing anagrelide to 1 mg/day I plan to see him again in 6 months for further follow-up I am hopeful that we might be able to reduce his dose of anagrelide further in the future  Essential hypertension His blood pressure is intermittently elevated It could be exacerbated by anxiety from the appointment However, he is noted to have persistently elevated blood pressure I recommend close monitoring of blood pressure at home and to call his primary care doctor for medication adjustment  Preventive measure He is up-to-date with influenza vaccination He has not received Covid vaccination He is concerned about taking the vaccination in the setting of his blood disorder I told the patient he should  consider Covid vaccination and his blood disorder is not a contraindication for him to receive the vaccine   No orders of the defined types were placed in this encounter.   All questions were answered. The patient knows to call the clinic with any problems, questions or concerns. The total time spent in the appointment was 20 minutes encounter with patients including review of chart and various tests results, discussions about plan of care and coordination of care plan   Heath Lark, MD 08/31/2020 2:04 PM  INTERVAL HISTORY: Please see below for problem oriented charting. He returns for further follow-up He is doing well He was able to lose 20 pounds of weight down to about 160 pounds before gradually gaining the weight back He denies recent infection, fever or  chills He noted that his blood pressure was intermittently elevated when he checks in the local store He is wondering about whether he should receive Covid vaccination  SUMMARY OF ONCOLOGIC HISTORY: Oncology History  Essential thrombocythemia ([place])  11/03/1984 Cancer Diagnosis   He was noted to have significant abnormal CBC   05/24/2003 Bone Marrow Biopsy   Case #: [id number] BM biopsy confirmed essential thrombocythemia   11/06/2003 -  Chemotherapy   He was placed on anagrelide   08/22/2016 - 08/29/2016 Hospital Admission   The patient was admitted to the hospital due to hemoptysis. CT angiogram of the chest concerning for pneumonia. Bronchoscopy done revealed a blot clot was found in the posterior segment of the right upper lobe (B2). It was completely obstructing the airway.The clot was successfully removed, but the lesion began bleeding. BAL was performed in the RUL posterior segment (B2) of the lung and sent for bacterial, AFB and fungal analysis   08/28/2016 Procedure   He underwent selective right bronchial arteriogram was performed and the vessel was embolized      REVIEW OF SYSTEMS:   Constitutional: Denies fevers, chills or abnormal weight loss Eyes: Denies blurriness of vision Ears, nose, mouth, throat, and face: Denies mucositis or sore throat Respiratory: Denies cough, dyspnea or wheezes Cardiovascular: Denies palpitation, chest discomfort or lower extremity swelling Gastrointestinal:  Denies nausea, heartburn or change in bowel habits Skin: Denies abnormal skin rashes Lymphatics: Denies new lymphadenopathy or easy bruising Neurological:Denies numbness, tingling or new weaknesses Behavioral/Psych: Mood is stable, no new changes  All other systems were reviewed with the patient and are negative.  I have reviewed  the past medical history, past surgical history, social history and family history with the patient and they are unchanged from previous note.  ALLERGIES:  has No  Known Allergies.  MEDICATIONS:  Current Outpatient Medications  Medication Sig Dispense Refill  . anagrelide (AGRYLIN) 1 MG capsule Take 1 capsule (1 mg total) by mouth daily. 90 capsule 6  . atorvastatin (LIPITOR) 20 MG tablet Take 1 tablet (20 mg total) by mouth daily. NO MORE REFILLS WITHOUT OFFICE VISIT/ LABS - 2ND NOTICE 15 tablet 0  . fluticasone (FLONASE) 50 MCG/ACT nasal spray PLACE 2 SPRAYS IN EACH NOSTRIL EVERY DAY 16 g 1  . glucose blood (ONE TOUCH ULTRA TEST) test strip Use as instructed 100 each 0  . hydrochlorothiazide (HYDRODIURIL) 25 MG tablet TAKE 2 TABLETS (50 MG DOSE) BY MOUTH DAILY.  1  . lisinopril (PRINIVIL,ZESTRIL) 40 MG tablet Take 1 tablet (40 mg total) by mouth daily. 30 tablet 0  . MATZIM LA 420 MG 24 hr tablet TAKE 1 TABLET (420 MG TOTAL) BY MOUTH DAILY. 15 tablet 0  . metoprolol succinate (TOPROL-XL) 50 MG 24 hr tablet TAKE ONE TABLET (50 MG DOSE) BY MOUTH DAILY.  4  . Multiple Vitamins-Minerals (MULTIVITAMIN WITH MINERALS) tablet Take 1 tablet by mouth daily.    Marland Kitchen SYNTHROID 200 MCG tablet TAKE ONE TABLET (200 MCG TOTAL) BY MOUTH DAILY.  1  . traZODone (DESYREL) 50 MG tablet Take 50 mg by mouth at bedtime.     No current facility-administered medications for this visit.    PHYSICAL EXAMINATION: ECOG PERFORMANCE STATUS: 1 - Symptomatic but completely ambulatory  Vitals:   08/31/20 1313  BP: (!) 161/75  Pulse: 85  Resp: 18  Temp: 97.9 F (36.6 C)  SpO2: 94%   Filed Weights   08/31/20 1313  Weight: 180 lb 3.2 oz (81.7 kg)    GENERAL:alert, no distress and comfortable NEURO: alert & oriented x 3 with fluent speech, no focal motor/sensory deficits  LABORATORY DATA:  I have reviewed the data as listed    Component Value Date/Time   NA 137 01/21/2019 1251   NA 137 12/16/2016 1328   K 4.5 01/21/2019 1251   K 4.1 12/16/2016 1328   CL 100 01/21/2019 1251   CL 105 09/17/2012 1030   CO2 28 01/21/2019 1251   CO2 24 12/16/2016 1328   GLUCOSE 125 (H)  01/21/2019 1251   GLUCOSE 154 (H) 12/16/2016 1328   GLUCOSE 140 (H) 09/17/2012 1030   BUN 21 (H) 01/21/2019 1251   BUN 14.1 12/16/2016 1328   CREATININE 1.39 (H) 01/21/2019 1251   CREATININE 1.22 12/18/2017 1427   CREATININE 0.9 12/16/2016 1328   CALCIUM 9.1 01/21/2019 1251   CALCIUM 9.9 12/16/2016 1328   PROT 7.2 01/21/2019 1251   PROT 7.3 12/16/2016 1328   ALBUMIN 4.0 01/21/2019 1251   ALBUMIN 4.0 12/16/2016 1328   AST 18 01/21/2019 1251   AST 22 12/18/2017 1427   AST 24 12/16/2016 1328   ALT 27 01/21/2019 1251   ALT 39 12/18/2017 1427   ALT 39 12/16/2016 1328   ALKPHOS 100 01/21/2019 1251   ALKPHOS 118 12/16/2016 1328   BILITOT 0.5 01/21/2019 1251   BILITOT 0.4 12/18/2017 1427   BILITOT 0.36 12/16/2016 1328   GFRNONAA 57 (L) 01/21/2019 1251   GFRNONAA >60 12/18/2017 1427   GFRAA >60 01/21/2019 1251   GFRAA >60 12/18/2017 1427    No results found for: SPEP, UPEP  Lab Results  Component Value Date  WBC 4.6 08/31/2020   NEUTROABS 3.0 08/31/2020   HGB 14.8 08/31/2020   HCT 43.5 08/31/2020   MCV 83.5 08/31/2020   PLT 202 08/31/2020      Chemistry      Component Value Date/Time   NA 137 01/21/2019 1251   NA 137 12/16/2016 1328   K 4.5 01/21/2019 1251   K 4.1 12/16/2016 1328   CL 100 01/21/2019 1251   CL 105 09/17/2012 1030   CO2 28 01/21/2019 1251   CO2 24 12/16/2016 1328   BUN 21 (H) 01/21/2019 1251   BUN 14.1 12/16/2016 1328   CREATININE 1.39 (H) 01/21/2019 1251   CREATININE 1.22 12/18/2017 1427   CREATININE 0.9 12/16/2016 1328      Component Value Date/Time   CALCIUM 9.1 01/21/2019 1251   CALCIUM 9.9 12/16/2016 1328   ALKPHOS 100 01/21/2019 1251   ALKPHOS 118 12/16/2016 1328   AST 18 01/21/2019 1251   AST 22 12/18/2017 1427   AST 24 12/16/2016 1328   ALT 27 01/21/2019 1251   ALT 39 12/18/2017 1427   ALT 39 12/16/2016 1328   BILITOT 0.5 01/21/2019 1251   BILITOT 0.4 12/18/2017 1427   BILITOT 0.36 12/16/2016 1328

## 2020-08-31 NOTE — Assessment & Plan Note (Signed)
His CBC is within normal limits He tolerated treatment well Since we have reduced anagrelide to 2 mg, his platelet remained stable With stability of his platelet count, I recommend reducing anagrelide to 1 mg/day I plan to see him again in 6 months for further follow-up I am hopeful that we might be able to reduce his dose of anagrelide further in the future

## 2020-08-31 NOTE — Assessment & Plan Note (Signed)
His blood pressure is intermittently elevated It could be exacerbated by anxiety from the appointment However, he is noted to have persistently elevated blood pressure I recommend close monitoring of blood pressure at home and to call his primary care doctor for medication adjustment

## 2020-08-31 NOTE — Assessment & Plan Note (Signed)
He is up-to-date with influenza vaccination He has not received Covid vaccination He is concerned about taking the vaccination in the setting of his blood disorder I told the patient he should  consider Covid vaccination and his blood disorder is not a contraindication for him to receive the vaccine

## 2020-09-03 ENCOUNTER — Telehealth: Payer: Self-pay | Admitting: Hematology and Oncology

## 2020-09-03 NOTE — Telephone Encounter (Signed)
Scheduled appt per 10/29 sch msg - mailed reminder letter with appt date and time

## 2020-09-14 MED FILL — ANAGRELIDE HCL 1 MG CAPSULE: 1 | 30 days supply | Qty: 60 | Fill #5

## 2020-11-09 MED FILL — ANAGRELIDE HCL 1 MG CAPSULE: 1 | 30 days supply | Qty: 60 | Fill #6

## 2021-02-07 ENCOUNTER — Other Ambulatory Visit (HOSPITAL_COMMUNITY): Payer: Self-pay

## 2021-02-11 ENCOUNTER — Other Ambulatory Visit (HOSPITAL_COMMUNITY): Payer: Self-pay

## 2021-02-12 ENCOUNTER — Telehealth: Payer: Self-pay

## 2021-02-12 ENCOUNTER — Other Ambulatory Visit (HOSPITAL_COMMUNITY): Payer: Self-pay

## 2021-02-12 ENCOUNTER — Other Ambulatory Visit: Payer: Self-pay

## 2021-02-12 MED ORDER — ANAGRELIDE HCL 1 MG PO CAPS
1.0000 mg | ORAL_CAPSULE | Freq: Every day | ORAL | 3 refills | Status: DC
  Filled 2021-02-12: qty 30, 30d supply, fill #0

## 2021-02-12 NOTE — Telephone Encounter (Signed)
He called back and ask Rx be sent to [facility] outpatient pharmacy.  Rx sent and notified [place] that Rx sent. He verbalized understanding.

## 2021-02-12 NOTE — Telephone Encounter (Signed)
He called and left a message requesting a refill on Anagrelide Rx.  Called back and left a message asking him to call the office back with what pharmacy her wants Rx sent to.

## 2021-03-01 ENCOUNTER — Other Ambulatory Visit: Payer: Self-pay

## 2021-03-01 ENCOUNTER — Inpatient Hospital Stay: Payer: Managed Care, Other (non HMO) | Attending: Hematology and Oncology | Admitting: Hematology and Oncology

## 2021-03-01 ENCOUNTER — Inpatient Hospital Stay: Payer: Managed Care, Other (non HMO)

## 2021-03-01 ENCOUNTER — Encounter: Payer: Self-pay | Admitting: Hematology and Oncology

## 2021-03-01 DIAGNOSIS — I10 Essential (primary) hypertension: Secondary | ICD-10-CM

## 2021-03-01 DIAGNOSIS — D473 Essential (hemorrhagic) thrombocythemia: Secondary | ICD-10-CM | POA: Insufficient documentation

## 2021-03-01 DIAGNOSIS — E663 Overweight: Secondary | ICD-10-CM | POA: Insufficient documentation

## 2021-03-01 DIAGNOSIS — Z6825 Body mass index (BMI) 25.0-25.9, adult: Secondary | ICD-10-CM | POA: Insufficient documentation

## 2021-03-01 DIAGNOSIS — D75839 Thrombocytosis, unspecified: Secondary | ICD-10-CM | POA: Diagnosis not present

## 2021-03-01 LAB — CBC WITH DIFFERENTIAL/PLATELET
Abs Immature Granulocytes: 0.03 10*3/uL (ref 0.00–0.07)
Basophils Absolute: 0 10*3/uL (ref 0.0–0.1)
Basophils Relative: 0 %
Eosinophils Absolute: 0.1 10*3/uL (ref 0.0–0.5)
Eosinophils Relative: 2 %
HCT: 43.9 % (ref 39.0–52.0)
Hemoglobin: 14.9 g/dL (ref 13.0–17.0)
Immature Granulocytes: 1 %
Lymphocytes Relative: 23 %
Lymphs Abs: 1.4 10*3/uL (ref 0.7–4.0)
MCH: 28.6 pg (ref 26.0–34.0)
MCHC: 33.9 g/dL (ref 30.0–36.0)
MCV: 84.3 fL (ref 80.0–100.0)
Monocytes Absolute: 0.4 10*3/uL (ref 0.1–1.0)
Monocytes Relative: 7 %
Neutro Abs: 4.1 10*3/uL (ref 1.7–7.7)
Neutrophils Relative %: 67 %
Platelets: 362 10*3/uL (ref 150–400)
RBC: 5.21 MIL/uL (ref 4.22–5.81)
RDW: 13.5 % (ref 11.5–15.5)
WBC: 6.1 10*3/uL (ref 4.0–10.5)
nRBC: 0 % (ref 0.0–0.2)

## 2021-03-01 NOTE — Assessment & Plan Note (Signed)
His blood pressure is intermittently elevated It could be exacerbated by anxiety from the appointment and recent weight gain We discussed importance of lifestyle changes

## 2021-03-01 NOTE — Assessment & Plan Note (Signed)
He has gained a lot of weight since last time I saw him We discussed the importance of aggressive lifestyle modification and weight loss He appears motivated and has agreed to try to lose 6 pounds over the next 6 months

## 2021-03-01 NOTE — Progress Notes (Signed)
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[facility]) His CBC is within normal limits He tolerated treatment well Since we have reduced anagrelide to 1 mg, his platelet remained stable With stability of his platelet count, I recommend him to continue on anagrelide to 1 mg/day I plan to see him again in 6 months for further follow-up  Essential hypertension His blood pressure is intermittently elevated It could be exacerbated by anxiety from the appointment and recent weight gain We discussed importance of lifestyle changes  Overweight (BMI 25.0-29.9) He has gained a lot of weight since last time I saw him We discussed the importance of aggressive lifestyle modification and weight loss He appears motivated and has agreed to try to lose 6 pounds over the next 6 months   No orders of the defined types were placed in this encounter.   All questions were answered. The patient knows to call the clinic with any problems, questions or concerns. The total time spent in the appointment was 20 minutes encounter with patients including review of chart and various tests results, discussions about plan of care and coordination of care plan   Heath Lark, MD 03/01/2021 2:13 PM  INTERVAL HISTORY: Please see below for problem oriented charting. He returns for further follow-up Since last time I saw him, he has gained almost 12 pounds of weight due to poor dietary choices Previously, he was able to successfully lost a lot of weight through meal planning and low-carb diet He has changed jobs early this year and had some stress Denies recent infection No recent bleeding  SUMMARY OF ONCOLOGIC HISTORY: Oncology History  Essential thrombocythemia ([place])  11/03/1984 Cancer Diagnosis   He was noted to have  significant abnormal CBC   05/24/2003 Bone Marrow Biopsy   Case #: [id number] BM biopsy confirmed essential thrombocythemia   11/06/2003 -  Chemotherapy   He was placed on anagrelide   08/22/2016 - 08/29/2016 Hospital Admission   The patient was admitted to the hospital due to hemoptysis. CT angiogram of the chest concerning for pneumonia. Bronchoscopy done revealed a blot clot was found in the posterior segment of the right upper lobe (B2). It was completely obstructing the airway.The clot was successfully removed, but the lesion began bleeding. BAL was performed in the RUL posterior segment (B2) of the lung and sent for bacterial, AFB and fungal analysis   08/28/2016 Procedure   He underwent selective right bronchial arteriogram was performed and the vessel was embolized      REVIEW OF SYSTEMS:   Constitutional: Denies fevers, chills or abnormal weight loss Eyes: Denies blurriness of vision Ears, nose, mouth, throat, and face: Denies mucositis or sore throat Respiratory: Denies cough, dyspnea or wheezes Cardiovascular: Denies palpitation, chest discomfort or lower extremity swelling Gastrointestinal:  Denies nausea, heartburn or change in bowel habits Skin: Denies abnormal skin rashes Lymphatics: Denies new lymphadenopathy or easy bruising Neurological:Denies numbness, tingling or new weaknesses Behavioral/Psych: Mood is stable, no new changes  All other systems were reviewed with the patient and are negative.  I have reviewed the past medical history, past surgical history, social history and family history with the patient and they are unchanged from previous note.  ALLERGIES:  has No Known Allergies.  MEDICATIONS:  Current Outpatient Medications  Medication Sig Dispense Refill  . anagrelide (AGRYLIN) 1 MG  capsule Take 1 capsule (1 mg total) by mouth daily. 90 capsule 3  . atorvastatin (LIPITOR) 20 MG tablet Take 1 tablet (20 mg total) by mouth daily. NO MORE REFILLS WITHOUT  OFFICE VISIT/ LABS - 2ND NOTICE 15 tablet 0  . fluticasone (FLONASE) 50 MCG/ACT nasal spray PLACE 2 SPRAYS IN EACH NOSTRIL EVERY DAY 16 g 1  . glucose blood (ONE TOUCH ULTRA TEST) test strip Use as instructed 100 each 0  . hydrochlorothiazide (HYDRODIURIL) 25 MG tablet TAKE 2 TABLETS (50 MG DOSE) BY MOUTH DAILY.  1  . lisinopril (PRINIVIL,ZESTRIL) 40 MG tablet Take 1 tablet (40 mg total) by mouth daily. 30 tablet 0  . MATZIM LA 420 MG 24 hr tablet TAKE 1 TABLET (420 MG TOTAL) BY MOUTH DAILY. 15 tablet 0  . metoprolol succinate (TOPROL-XL) 50 MG 24 hr tablet TAKE ONE TABLET (50 MG DOSE) BY MOUTH DAILY.  4  . Multiple Vitamins-Minerals (MULTIVITAMIN WITH MINERALS) tablet Take 1 tablet by mouth daily.    Marland Kitchen SYNTHROID 200 MCG tablet TAKE ONE TABLET (200 MCG TOTAL) BY MOUTH DAILY.  1  . traZODone (DESYREL) 50 MG tablet Take 50 mg by mouth at bedtime.     No current facility-administered medications for this visit.    PHYSICAL EXAMINATION: ECOG PERFORMANCE STATUS: 0 - Asymptomatic  Vitals:   03/01/21 1227  BP: (!) 167/63  Pulse: 68  Resp: 16  Temp: (!) 97.4 F (36.3 C)  SpO2: 97%   Filed Weights   03/01/21 1227  Weight: 191 lb 12.8 oz (87 kg)    GENERAL:alert, no distress and comfortable NEURO: alert & oriented x 3 with fluent speech, no focal motor/sensory deficits  LABORATORY DATA:  I have reviewed the data as listed    Component Value Date/Time   NA 137 01/21/2019 1251   NA 137 12/16/2016 1328   K 4.5 01/21/2019 1251   K 4.1 12/16/2016 1328   CL 100 01/21/2019 1251   CL 105 09/17/2012 1030   CO2 28 01/21/2019 1251   CO2 24 12/16/2016 1328   GLUCOSE 125 (H) 01/21/2019 1251   GLUCOSE 154 (H) 12/16/2016 1328   GLUCOSE 140 (H) 09/17/2012 1030   BUN 21 (H) 01/21/2019 1251   BUN 14.1 12/16/2016 1328   CREATININE 1.39 (H) 01/21/2019 1251   CREATININE 1.22 12/18/2017 1427   CREATININE 0.9 12/16/2016 1328   CALCIUM 9.1 01/21/2019 1251   CALCIUM 9.9 12/16/2016 1328   PROT  7.2 01/21/2019 1251   PROT 7.3 12/16/2016 1328   ALBUMIN 4.0 01/21/2019 1251   ALBUMIN 4.0 12/16/2016 1328   AST 18 01/21/2019 1251   AST 22 12/18/2017 1427   AST 24 12/16/2016 1328   ALT 27 01/21/2019 1251   ALT 39 12/18/2017 1427   ALT 39 12/16/2016 1328   ALKPHOS 100 01/21/2019 1251   ALKPHOS 118 12/16/2016 1328   BILITOT 0.5 01/21/2019 1251   BILITOT 0.4 12/18/2017 1427   BILITOT 0.36 12/16/2016 1328   GFRNONAA 57 (L) 01/21/2019 1251   GFRNONAA >60 12/18/2017 1427   GFRAA >60 01/21/2019 1251   GFRAA >60 12/18/2017 1427    No results found for: SPEP, UPEP  Lab Results  Component Value Date   WBC 6.1 03/01/2021   NEUTROABS 4.1 03/01/2021   HGB 14.9 03/01/2021   HCT 43.9 03/01/2021   MCV 84.3 03/01/2021   PLT 362 03/01/2021      Chemistry      Component Value Date/Time   NA 137 01/21/2019  1251   NA 137 12/16/2016 1328   K 4.5 01/21/2019 1251   K 4.1 12/16/2016 1328   CL 100 01/21/2019 1251   CL 105 09/17/2012 1030   CO2 28 01/21/2019 1251   CO2 24 12/16/2016 1328   BUN 21 (H) 01/21/2019 1251   BUN 14.1 12/16/2016 1328   CREATININE 1.39 (H) 01/21/2019 1251   CREATININE 1.22 12/18/2017 1427   CREATININE 0.9 12/16/2016 1328      Component Value Date/Time   CALCIUM 9.1 01/21/2019 1251   CALCIUM 9.9 12/16/2016 1328   ALKPHOS 100 01/21/2019 1251   ALKPHOS 118 12/16/2016 1328   AST 18 01/21/2019 1251   AST 22 12/18/2017 1427   AST 24 12/16/2016 1328   ALT 27 01/21/2019 1251   ALT 39 12/18/2017 1427   ALT 39 12/16/2016 1328   BILITOT 0.5 01/21/2019 1251   BILITOT 0.4 12/18/2017 1427   BILITOT 0.36 12/16/2016 1328

## 2021-03-01 NOTE — Assessment & Plan Note (Signed)
His CBC is within normal limits He tolerated treatment well Since we have reduced anagrelide to 1 mg, his platelet remained stable With stability of his platelet count, I recommend him to continue on anagrelide to 1 mg/day I plan to see him again in 6 months for further follow-up

## 2021-03-05 ENCOUNTER — Telehealth: Payer: Self-pay | Admitting: Hematology and Oncology

## 2021-03-05 NOTE — Telephone Encounter (Signed)
Scheduled appt per 4/29 sch msg. Pt aware.  

## 2021-09-06 ENCOUNTER — Inpatient Hospital Stay: Payer: Managed Care, Other (non HMO) | Admitting: Hematology and Oncology

## 2021-09-06 ENCOUNTER — Inpatient Hospital Stay: Payer: Managed Care, Other (non HMO)

## 2021-09-30 ENCOUNTER — Inpatient Hospital Stay: Payer: Managed Care, Other (non HMO) | Attending: Hematology and Oncology

## 2021-09-30 ENCOUNTER — Inpatient Hospital Stay: Payer: Managed Care, Other (non HMO) | Admitting: Hematology and Oncology

## 2021-09-30 ENCOUNTER — Other Ambulatory Visit: Payer: Self-pay

## 2021-09-30 DIAGNOSIS — E119 Type 2 diabetes mellitus without complications: Secondary | ICD-10-CM

## 2021-09-30 DIAGNOSIS — D473 Essential (hemorrhagic) thrombocythemia: Secondary | ICD-10-CM | POA: Insufficient documentation

## 2021-09-30 LAB — CBC WITH DIFFERENTIAL/PLATELET
Abs Immature Granulocytes: 0.03 10*3/uL (ref 0.00–0.07)
Basophils Absolute: 0 10*3/uL (ref 0.0–0.1)
Basophils Relative: 0 %
Eosinophils Absolute: 0.2 10*3/uL (ref 0.0–0.5)
Eosinophils Relative: 2 %
HCT: 39.6 % (ref 39.0–52.0)
Hemoglobin: 13.3 g/dL (ref 13.0–17.0)
Immature Granulocytes: 0 %
Lymphocytes Relative: 17 %
Lymphs Abs: 1.2 10*3/uL (ref 0.7–4.0)
MCH: 28.8 pg (ref 26.0–34.0)
MCHC: 33.6 g/dL (ref 30.0–36.0)
MCV: 85.7 fL (ref 80.0–100.0)
Monocytes Absolute: 0.5 10*3/uL (ref 0.1–1.0)
Monocytes Relative: 7 %
Neutro Abs: 5.2 10*3/uL (ref 1.7–7.7)
Neutrophils Relative %: 74 %
Platelets: 254 10*3/uL (ref 150–400)
RBC: 4.62 MIL/uL (ref 4.22–5.81)
RDW: 13.1 % (ref 11.5–15.5)
WBC: 7.1 10*3/uL (ref 4.0–10.5)
nRBC: 0 % (ref 0.0–0.2)

## 2021-10-02 ENCOUNTER — Encounter: Payer: Self-pay | Admitting: Hematology and Oncology

## 2021-10-02 NOTE — Assessment & Plan Note (Signed)
My original plan would be to taper him off anagrelide and see what happens to his platelet count However, the patient had self increased the dose of anagrelide to 2 mg every other day I am not able to make decision based on his recent platelet count I recommend he reduces his platelet count again 3 weeks prior to his next appointment with his primary care physician and see if his platelet count continues to stay within normal range I instructed him to give me a call with the test results

## 2021-10-02 NOTE — Assessment & Plan Note (Signed)
He has multiple cardiovascular risk factors We spent some time discussing the importance of lifestyle changes and weight loss I am hopeful, if he is able to lose weight, I believe he would need less medications including anagrelide

## 2021-10-02 NOTE — Progress Notes (Signed)
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[facility]) My original plan would be to taper him off anagrelide and see what happens to his platelet count However, the patient had self increased the dose of anagrelide to 2 mg every other day I am not able to make decision based on his recent platelet count I recommend he reduces his platelet count again 3 weeks prior to his next appointment with his primary care physician and see if his platelet count continues to stay within normal range I instructed him to give me a call with the test results   Diabetes mellitus type 2, uncomplicated ([place]) He has multiple cardiovascular risk factors We spent some time discussing the importance of lifestyle changes and weight loss I am hopeful, if he is able to lose weight, I believe he would need less medications including anagrelide  No orders of the defined types were placed in this encounter.   All questions were answered. The patient knows to call the clinic with any problems, questions or concerns. The total time spent in the appointment was 20 minutes encounter with patients including review of chart and various tests results, discussions about plan of care and coordination of care plan   Heath Lark, MD 10/02/2021 9:45 AM  INTERVAL HISTORY: Please see below for problem oriented charting. he returns for treatment follow-up on anagrelide for essential thrombocythemia Since last time I saw him, he is doing well and lost just a little bit of weight He self increased the dose of anagrelide 3 weeks ago because he felt that he needed extra doses No recent infection, fever or chills No recent bleeding  REVIEW OF SYSTEMS:   Constitutional: Denies fevers, chills or abnormal weight loss Eyes: Denies blurriness of  vision Ears, nose, mouth, throat, and face: Denies mucositis or sore throat Respiratory: Denies cough, dyspnea or wheezes Cardiovascular: Denies palpitation, chest discomfort or lower extremity swelling Gastrointestinal:  Denies nausea, heartburn or change in bowel habits Skin: Denies abnormal skin rashes Lymphatics: Denies new lymphadenopathy or easy bruising Neurological:Denies numbness, tingling or new weaknesses Behavioral/Psych: Mood is stable, no new changes  All other systems were reviewed with the patient and are negative.  I have reviewed the past medical history, past surgical history, social history and family history with the patient and they are unchanged from previous note.  ALLERGIES:  has No Known Allergies.  MEDICATIONS:  Current Outpatient Medications  Medication Sig Dispense Refill   NIFEdipine (ADALAT CC) 60 MG 24 hr tablet Take 60 mg by mouth in the morning and at bedtime.     spironolactone (ALDACTONE) 25 MG tablet Take 25 mg by mouth daily.     anagrelide (AGRYLIN) 1 MG capsule Take 1 capsule (1 mg total) by mouth daily. 90 capsule 3   atorvastatin (LIPITOR) 20 MG tablet Take 1 tablet (20 mg total) by mouth daily. NO MORE REFILLS WITHOUT OFFICE VISIT/ LABS - 2ND NOTICE 15 tablet 0   fluticasone (FLONASE) 50 MCG/ACT nasal spray PLACE 2 SPRAYS IN EACH NOSTRIL EVERY DAY 16 g 1   glucose blood (ONE TOUCH ULTRA TEST) test strip Use as instructed 100 each 0   hydrochlorothiazide (HYDRODIURIL) 25 MG tablet TAKE 2 TABLETS (50 MG DOSE) BY MOUTH DAILY.  1   lisinopril (PRINIVIL,ZESTRIL) 40 MG tablet Take 1 tablet (40 mg total) by  mouth daily. 30 tablet 0   metoprolol succinate (TOPROL-XL) 50 MG 24 hr tablet TAKE ONE TABLET (50 MG DOSE) BY MOUTH DAILY.  4   Multiple Vitamins-Minerals (MULTIVITAMIN WITH MINERALS) tablet Take 1 tablet by mouth daily.     SYNTHROID 200 MCG tablet TAKE ONE TABLET (200 MCG TOTAL) BY MOUTH DAILY.  1   traZODone (DESYREL) 50 MG tablet Take 50 mg by  mouth at bedtime.     No current facility-administered medications for this visit.    SUMMARY OF ONCOLOGIC HISTORY: Oncology History  Essential thrombocythemia ([place])  11/03/1984 Cancer Diagnosis   He was noted to have significant abnormal CBC   05/24/2003 Bone Marrow Biopsy   Case #: [id number] BM biopsy confirmed essential thrombocythemia   11/06/2003 -  Chemotherapy   He was placed on anagrelide   08/22/2016 - 08/29/2016 Hospital Admission   The patient was admitted to the hospital due to hemoptysis. CT angiogram of the chest concerning for pneumonia. Bronchoscopy done revealed a blot clot was found in the posterior segment of the right upper lobe (B2). It was completely obstructing the airway.The clot was successfully removed, but the lesion began bleeding. BAL was performed in the RUL posterior segment (B2) of the lung and sent for bacterial, AFB and fungal analysis   08/28/2016 Procedure   He underwent selective right bronchial arteriogram was performed and the vessel was embolized      PHYSICAL EXAMINATION: ECOG PERFORMANCE STATUS: 0 - Asymptomatic  Vitals:   09/30/21 1242  BP: 128/75  Pulse: 92  Resp: 18  Temp: 99.2 F (37.3 C)  SpO2: 95%   Filed Weights   09/30/21 1242  Weight: 190 lb 3.2 oz (86.3 kg)    GENERAL:alert, no distress and comfortable NEURO: alert & oriented x 3 with fluent speech, no focal motor/sensory deficits  LABORATORY DATA:  I have reviewed the data as listed    Component Value Date/Time   NA 137 01/21/2019 1251   NA 137 12/16/2016 1328   K 4.5 01/21/2019 1251   K 4.1 12/16/2016 1328   CL 100 01/21/2019 1251   CL 105 09/17/2012 1030   CO2 28 01/21/2019 1251   CO2 24 12/16/2016 1328   GLUCOSE 125 (H) 01/21/2019 1251   GLUCOSE 154 (H) 12/16/2016 1328   GLUCOSE 140 (H) 09/17/2012 1030   BUN 21 (H) 01/21/2019 1251   BUN 14.1 12/16/2016 1328   CREATININE 1.39 (H) 01/21/2019 1251   CREATININE 1.22 12/18/2017 1427   CREATININE 0.9  12/16/2016 1328   CALCIUM 9.1 01/21/2019 1251   CALCIUM 9.9 12/16/2016 1328   PROT 7.2 01/21/2019 1251   PROT 7.3 12/16/2016 1328   ALBUMIN 4.0 01/21/2019 1251   ALBUMIN 4.0 12/16/2016 1328   AST 18 01/21/2019 1251   AST 22 12/18/2017 1427   AST 24 12/16/2016 1328   ALT 27 01/21/2019 1251   ALT 39 12/18/2017 1427   ALT 39 12/16/2016 1328   ALKPHOS 100 01/21/2019 1251   ALKPHOS 118 12/16/2016 1328   BILITOT 0.5 01/21/2019 1251   BILITOT 0.4 12/18/2017 1427   BILITOT 0.36 12/16/2016 1328   GFRNONAA 57 (L) 01/21/2019 1251   GFRNONAA >60 12/18/2017 1427   GFRAA >60 01/21/2019 1251   GFRAA >60 12/18/2017 1427    No results found for: SPEP, UPEP  Lab Results  Component Value Date   WBC 7.1 09/30/2021   NEUTROABS 5.2 09/30/2021   HGB 13.3 09/30/2021   HCT 39.6 09/30/2021   MCV 85.7  09/30/2021   PLT 254 09/30/2021      Chemistry      Component Value Date/Time   NA 137 01/21/2019 1251   NA 137 12/16/2016 1328   K 4.5 01/21/2019 1251   K 4.1 12/16/2016 1328   CL 100 01/21/2019 1251   CL 105 09/17/2012 1030   CO2 28 01/21/2019 1251   CO2 24 12/16/2016 1328   BUN 21 (H) 01/21/2019 1251   BUN 14.1 12/16/2016 1328   CREATININE 1.39 (H) 01/21/2019 1251   CREATININE 1.22 12/18/2017 1427   CREATININE 0.9 12/16/2016 1328      Component Value Date/Time   CALCIUM 9.1 01/21/2019 1251   CALCIUM 9.9 12/16/2016 1328   ALKPHOS 100 01/21/2019 1251   ALKPHOS 118 12/16/2016 1328   AST 18 01/21/2019 1251   AST 22 12/18/2017 1427   AST 24 12/16/2016 1328   ALT 27 01/21/2019 1251   ALT 39 12/18/2017 1427   ALT 39 12/16/2016 1328   BILITOT 0.5 01/21/2019 1251   BILITOT 0.4 12/18/2017 1427   BILITOT 0.36 12/16/2016 1328

## 2021-12-24 ENCOUNTER — Telehealth: Payer: Self-pay

## 2021-12-24 NOTE — Telephone Encounter (Signed)
He called and left a message. On 2/20 platelet count is 464. You can see in care everywhere.  He is asking how he should take the Anagrelide?

## 2021-12-24 NOTE — Telephone Encounter (Signed)
Usually we would stop anagrelide Previously, stopping was not acceptable so I am recommending him to take it every other day

## 2021-12-24 NOTE — Telephone Encounter (Signed)
Called and offered appt on 2/28 at 29 with Dr. [name], lab appt at [facility] prior to appt. Do not fast for labs that day. He will call the office  back about the appt. He will check with work and call back. Told him that he can continue Anagrelide as he is currently taking until seen in the office.

## 2021-12-24 NOTE — Telephone Encounter (Signed)
I advise him to take it every other day

## 2021-12-24 NOTE — Telephone Encounter (Signed)
Called and left a message asking him to call the office back. 

## 2021-12-24 NOTE — Telephone Encounter (Signed)
Called back and given below message. He wants to clarify that he should should take the anagrelide every other day? For the last 3 weeks prior to lab check he has been taking Anagrelide daily. He is concerned that his platelet count is too high.

## 2022-01-20 ENCOUNTER — Other Ambulatory Visit: Payer: Self-pay

## 2022-01-20 DIAGNOSIS — D473 Essential (hemorrhagic) thrombocythemia: Secondary | ICD-10-CM

## 2022-01-21 ENCOUNTER — Other Ambulatory Visit: Payer: Self-pay

## 2022-01-21 ENCOUNTER — Other Ambulatory Visit: Payer: Self-pay | Admitting: Hematology and Oncology

## 2022-01-21 ENCOUNTER — Inpatient Hospital Stay: Payer: Managed Care, Other (non HMO) | Attending: Hematology and Oncology

## 2022-01-21 ENCOUNTER — Encounter: Payer: Self-pay | Admitting: Hematology and Oncology

## 2022-01-21 ENCOUNTER — Inpatient Hospital Stay: Payer: Managed Care, Other (non HMO) | Admitting: Hematology and Oncology

## 2022-01-21 VITALS — BP 138/67 | HR 87 | Temp 97.7°F | Resp 18 | Ht 67.5 in | Wt 192.2 lb

## 2022-01-21 DIAGNOSIS — D75839 Thrombocytosis, unspecified: Secondary | ICD-10-CM | POA: Insufficient documentation

## 2022-01-21 DIAGNOSIS — D473 Essential (hemorrhagic) thrombocythemia: Secondary | ICD-10-CM | POA: Insufficient documentation

## 2022-01-21 DIAGNOSIS — Z79899 Other long term (current) drug therapy: Secondary | ICD-10-CM | POA: Insufficient documentation

## 2022-01-21 LAB — CBC WITH DIFFERENTIAL (CANCER CENTER ONLY)
Abs Immature Granulocytes: 0.04 10*3/uL (ref 0.00–0.07)
Basophils Absolute: 0 10*3/uL (ref 0.0–0.1)
Basophils Relative: 0 %
Eosinophils Absolute: 0.2 10*3/uL (ref 0.0–0.5)
Eosinophils Relative: 3 %
HCT: 42.3 % (ref 39.0–52.0)
Hemoglobin: 14.7 g/dL (ref 13.0–17.0)
Immature Granulocytes: 1 %
Lymphocytes Relative: 21 %
Lymphs Abs: 1.4 10*3/uL (ref 0.7–4.0)
MCH: 28.9 pg (ref 26.0–34.0)
MCHC: 34.8 g/dL (ref 30.0–36.0)
MCV: 83.3 fL (ref 80.0–100.0)
Monocytes Absolute: 0.5 10*3/uL (ref 0.1–1.0)
Monocytes Relative: 7 %
Neutro Abs: 4.6 10*3/uL (ref 1.7–7.7)
Neutrophils Relative %: 68 %
Platelet Count: 392 10*3/uL (ref 150–400)
RBC: 5.08 MIL/uL (ref 4.22–5.81)
RDW: 12.9 % (ref 11.5–15.5)
WBC Count: 6.7 10*3/uL (ref 4.0–10.5)
nRBC: 0 % (ref 0.0–0.2)

## 2022-01-21 MED ORDER — ANAGRELIDE HCL 1 MG PO CAPS
1.0000 mg | ORAL_CAPSULE | Freq: Every day | ORAL | 3 refills | Status: DC
  Filled 2022-07-29 – 2022-08-04 (×2): qty 30, 30d supply, fill #0
  Filled 2022-08-05: qty 5, 5d supply, fill #0
  Filled 2022-08-05: qty 25, 25d supply, fill #0

## 2022-01-21 NOTE — Assessment & Plan Note (Signed)
The patient has significant concern recently when his platelet count went up ?That was done in the setting of routine visit with primary care doctor ?He has not changed the dose of anagrelide ?He remains at 1 mg daily ?He denies recent stress, infection or surgery ?Repeat CBC today is within normal range ?We discussed different factors that could cause elevated platelet count ?As long as his platelet count stays under 500,000, he does not need dose changes ?I have refilled his prescription today and plan to see him again in 6 months for further follow-up ?

## 2022-01-21 NOTE — Progress Notes (Signed)
[place] ?OFFICE PROGRESS NOTE ? ?Patient Care Team: ?Rich Fuchs, PA as PCP - General (Physician Assistant) ?Heath Lark, MD as Consulting Physician (Hematology and Oncology) ? ?ASSESSMENT & PLAN:  ?Essential thrombocythemia ([place]) ?The patient has significant concern recently when his platelet count went up ?That was done in the setting of routine visit with primary care doctor ?He has not changed the dose of anagrelide ?He remains at 1 mg daily ?He denies recent stress, infection or surgery ?Repeat CBC today is within normal range ?We discussed different factors that could cause elevated platelet count ?As long as his platelet count stays under 500,000, he does not need dose changes ?I have refilled his prescription today and plan to see him again in 6 months for further follow-up ? ? ?Orders Placed This Encounter  ?Procedures  ? CBC with Differential ([place] Only)  ?  Standing Status:   Future  ?  Standing Expiration Date:   01/22/2023  ? ? ?All questions were answered. The patient knows to call the clinic with any problems, questions or concerns. ?The total time spent in the appointment was 20 minutes encounter with patients including review of chart and various tests results, discussions about plan of care and coordination of care plan ?  ?Heath Lark, MD ?01/21/2022 10:20 AM ? ?INTERVAL HISTORY: ?Please see below for problem oriented charting. ?he returns for treatment follow-up for essential thrombocytosis ?The patient had recent concerns due to elevated platelet count with his primary care doctor's visit ?He did not change the dose of anagrelide and remain in 1 mg daily ?No recent infection, fever or chills ?Denies recent bleeding ? ?REVIEW OF SYSTEMS:   ?Constitutional: Denies fevers, chills or abnormal weight loss ?Eyes: Denies blurriness of vision ?Ears, nose, mouth, throat, and face: Denies mucositis or sore throat ?Respiratory: Denies cough, dyspnea or wheezes ?Cardiovascular:  Denies palpitation, chest discomfort or lower extremity swelling ?Gastrointestinal:  Denies nausea, heartburn or change in bowel habits ?Skin: Denies abnormal skin rashes ?Lymphatics: Denies new lymphadenopathy or easy bruising ?Neurological:Denies numbness, tingling or new weaknesses ?Behavioral/Psych: Mood is stable, no new changes  ?All other systems were reviewed with the patient and are negative. ? ?I have reviewed the past medical history, past surgical history, social history and family history with the patient and they are unchanged from previous note. ? ?ALLERGIES:  has No Known Allergies. ? ?MEDICATIONS:  ?Current Outpatient Medications  ?Medication Sig Dispense Refill  ? anagrelide (AGRYLIN) 1 MG capsule Take 1 capsule (1 mg total) by mouth daily. 90 capsule 3  ? atorvastatin (LIPITOR) 20 MG tablet Take 1 tablet (20 mg total) by mouth daily. NO MORE REFILLS WITHOUT OFFICE VISIT/ LABS - 2ND NOTICE 15 tablet 0  ? fluticasone (FLONASE) 50 MCG/ACT nasal spray PLACE 2 SPRAYS IN EACH NOSTRIL EVERY DAY 16 g 1  ? glucose blood (ONE TOUCH ULTRA TEST) test strip Use as instructed 100 each 0  ? hydrochlorothiazide (HYDRODIURIL) 25 MG tablet TAKE 2 TABLETS (50 MG DOSE) BY MOUTH DAILY.  1  ? lisinopril (PRINIVIL,ZESTRIL) 40 MG tablet Take 1 tablet (40 mg total) by mouth daily. 30 tablet 0  ? metoprolol succinate (TOPROL-XL) 50 MG 24 hr tablet TAKE ONE TABLET (50 MG DOSE) BY MOUTH DAILY.  4  ? Multiple Vitamins-Minerals (MULTIVITAMIN WITH MINERALS) tablet Take 1 tablet by mouth daily.    ? NIFEdipine (ADALAT CC) 60 MG 24 hr tablet Take 60 mg by mouth in the morning and at bedtime.    ?  spironolactone (ALDACTONE) 25 MG tablet Take 25 mg by mouth daily.    ? SYNTHROID 200 MCG tablet TAKE ONE TABLET (200 MCG TOTAL) BY MOUTH DAILY.  1  ? traZODone (DESYREL) 50 MG tablet Take 50 mg by mouth at bedtime.    ? ?No current facility-administered medications for this visit.  ? ? ?SUMMARY OF ONCOLOGIC HISTORY: ?Oncology History   ?Essential thrombocythemia ([place])  ?11/03/1984 Cancer Diagnosis  ? He was noted to have significant abnormal CBC ?  ?05/24/2003 Bone Marrow Biopsy  ? Case #: [id number] BM biopsy confirmed essential thrombocythemia ?  ?11/06/2003 -  Chemotherapy  ? He was placed on anagrelide ?  ?08/22/2016 - 08/29/2016 Hospital Admission  ? The patient was admitted to the hospital due to hemoptysis. CT angiogram of the chest concerning for pneumonia. Bronchoscopy done revealed a blot clot was found in the posterior segment of the right upper lobe (B2). It was completely obstructing the airway.The clot was successfully removed, but the lesion began bleeding. BAL was performed in the RUL posterior segment (B2) of the lung and sent for bacterial, AFB and fungal analysis ?  ?08/28/2016 Procedure  ? He underwent selective right bronchial arteriogram was performed and the vessel was embolized  ?  ? ? ?PHYSICAL EXAMINATION: ?ECOG PERFORMANCE STATUS: 0 - Asymptomatic ? ?Vitals:  ? 01/21/22 0950  ?BP: 138/67  ?Pulse: 87  ?Resp: 18  ?Temp: 97.7 ?F (36.5 ?C)  ?SpO2: 97%  ? ?Filed Weights  ? 01/21/22 0950  ?Weight: 192 lb 3.2 oz (87.2 kg)  ? ? ?GENERAL:alert, no distress and comfortable ?NEURO: alert & oriented x 3 with fluent speech, no focal motor/sensory deficits ? ?LABORATORY DATA:  ?I have reviewed the data as listed ?   ?Component Value Date/Time  ? NA 137 01/21/2019 1251  ? NA 137 12/16/2016 1328  ? K 4.5 01/21/2019 1251  ? K 4.1 12/16/2016 1328  ? CL 100 01/21/2019 1251  ? CL 105 09/17/2012 1030  ? CO2 28 01/21/2019 1251  ? CO2 24 12/16/2016 1328  ? GLUCOSE 125 (H) 01/21/2019 1251  ? GLUCOSE 154 (H) 12/16/2016 1328  ? GLUCOSE 140 (H) 09/17/2012 1030  ? BUN 21 (H) 01/21/2019 1251  ? BUN 14.1 12/16/2016 1328  ? CREATININE 1.39 (H) 01/21/2019 1251  ? CREATININE 1.22 12/18/2017 1427  ? CREATININE 0.9 12/16/2016 1328  ? CALCIUM 9.1 01/21/2019 1251  ? CALCIUM 9.9 12/16/2016 1328  ? PROT 7.2 01/21/2019 1251  ? PROT 7.3 12/16/2016 1328  ? ALBUMIN 4.0  01/21/2019 1251  ? ALBUMIN 4.0 12/16/2016 1328  ? AST 18 01/21/2019 1251  ? AST 22 12/18/2017 1427  ? AST 24 12/16/2016 1328  ? ALT 27 01/21/2019 1251  ? ALT 39 12/18/2017 1427  ? ALT 39 12/16/2016 1328  ? ALKPHOS 100 01/21/2019 1251  ? ALKPHOS 118 12/16/2016 1328  ? BILITOT 0.5 01/21/2019 1251  ? BILITOT 0.4 12/18/2017 1427  ? BILITOT 0.36 12/16/2016 1328  ? GFRNONAA 57 (L) 01/21/2019 1251  ? GFRNONAA >60 12/18/2017 1427  ? GFRAA >60 01/21/2019 1251  ? GFRAA >60 12/18/2017 1427  ? ? ?No results found for: SPEP, UPEP ? ?Lab Results  ?Component Value Date  ? WBC 6.7 01/21/2022  ? NEUTROABS 4.6 01/21/2022  ? HGB 14.7 01/21/2022  ? HCT 42.3 01/21/2022  ? MCV 83.3 01/21/2022  ? PLT 392 01/21/2022  ? ? ?  Chemistry   ?   ?Component Value Date/Time  ? NA 137 01/21/2019 1251  ? NA  137 12/16/2016 1328  ? K 4.5 01/21/2019 1251  ? K 4.1 12/16/2016 1328  ? CL 100 01/21/2019 1251  ? CL 105 09/17/2012 1030  ? CO2 28 01/21/2019 1251  ? CO2 24 12/16/2016 1328  ? BUN 21 (H) 01/21/2019 1251  ? BUN 14.1 12/16/2016 1328  ? CREATININE 1.39 (H) 01/21/2019 1251  ? CREATININE 1.22 12/18/2017 1427  ? CREATININE 0.9 12/16/2016 1328  ?    ?Component Value Date/Time  ? CALCIUM 9.1 01/21/2019 1251  ? CALCIUM 9.9 12/16/2016 1328  ? ALKPHOS 100 01/21/2019 1251  ? ALKPHOS 118 12/16/2016 1328  ? AST 18 01/21/2019 1251  ? AST 22 12/18/2017 1427  ? AST 24 12/16/2016 1328  ? ALT 27 01/21/2019 1251  ? ALT 39 12/18/2017 1427  ? ALT 39 12/16/2016 1328  ? BILITOT 0.5 01/21/2019 1251  ? BILITOT 0.4 12/18/2017 1427  ? BILITOT 0.36 12/16/2016 1328  ?  ? ? ?

## 2022-04-01 ENCOUNTER — Ambulatory Visit: Payer: Managed Care, Other (non HMO) | Admitting: Hematology and Oncology

## 2022-04-01 ENCOUNTER — Other Ambulatory Visit: Payer: Managed Care, Other (non HMO)

## 2022-07-24 ENCOUNTER — Inpatient Hospital Stay: Payer: Managed Care, Other (non HMO) | Admitting: Hematology and Oncology

## 2022-07-24 ENCOUNTER — Inpatient Hospital Stay: Payer: Managed Care, Other (non HMO)

## 2022-07-29 ENCOUNTER — Other Ambulatory Visit (HOSPITAL_COMMUNITY): Payer: Self-pay

## 2022-07-30 ENCOUNTER — Other Ambulatory Visit (HOSPITAL_COMMUNITY): Payer: Self-pay

## 2022-08-04 ENCOUNTER — Other Ambulatory Visit (HOSPITAL_COMMUNITY): Payer: Self-pay

## 2022-08-05 ENCOUNTER — Other Ambulatory Visit (HOSPITAL_COMMUNITY): Payer: Self-pay

## 2022-08-28 ENCOUNTER — Inpatient Hospital Stay (HOSPITAL_BASED_OUTPATIENT_CLINIC_OR_DEPARTMENT_OTHER): Payer: BC Managed Care – PPO | Admitting: Hematology and Oncology

## 2022-08-28 ENCOUNTER — Other Ambulatory Visit (HOSPITAL_COMMUNITY): Payer: Self-pay

## 2022-08-28 ENCOUNTER — Other Ambulatory Visit: Payer: Self-pay

## 2022-08-28 ENCOUNTER — Inpatient Hospital Stay: Payer: BC Managed Care – PPO | Attending: Hematology and Oncology

## 2022-08-28 ENCOUNTER — Encounter: Payer: Self-pay | Admitting: Hematology and Oncology

## 2022-08-28 VITALS — BP 150/70 | HR 65 | Temp 98.5°F | Resp 18 | Ht 67.5 in | Wt 197.6 lb

## 2022-08-28 DIAGNOSIS — D539 Nutritional anemia, unspecified: Secondary | ICD-10-CM | POA: Insufficient documentation

## 2022-08-28 DIAGNOSIS — D473 Essential (hemorrhagic) thrombocythemia: Secondary | ICD-10-CM | POA: Insufficient documentation

## 2022-08-28 DIAGNOSIS — D75839 Thrombocytosis, unspecified: Secondary | ICD-10-CM | POA: Insufficient documentation

## 2022-08-28 DIAGNOSIS — N189 Chronic kidney disease, unspecified: Secondary | ICD-10-CM | POA: Diagnosis not present

## 2022-08-28 DIAGNOSIS — I10 Essential (primary) hypertension: Secondary | ICD-10-CM | POA: Diagnosis not present

## 2022-08-28 LAB — CBC WITH DIFFERENTIAL (CANCER CENTER ONLY)
Abs Immature Granulocytes: 0.02 10*3/uL (ref 0.00–0.07)
Basophils Absolute: 0 10*3/uL (ref 0.0–0.1)
Basophils Relative: 0 %
Eosinophils Absolute: 0.2 10*3/uL (ref 0.0–0.5)
Eosinophils Relative: 2 %
HCT: 38.8 % — ABNORMAL LOW (ref 39.0–52.0)
Hemoglobin: 12.9 g/dL — ABNORMAL LOW (ref 13.0–17.0)
Immature Granulocytes: 0 %
Lymphocytes Relative: 25 %
Lymphs Abs: 1.5 10*3/uL (ref 0.7–4.0)
MCH: 28.8 pg (ref 26.0–34.0)
MCHC: 33.2 g/dL (ref 30.0–36.0)
MCV: 86.6 fL (ref 80.0–100.0)
Monocytes Absolute: 0.4 10*3/uL (ref 0.1–1.0)
Monocytes Relative: 7 %
Neutro Abs: 4 10*3/uL (ref 1.7–7.7)
Neutrophils Relative %: 66 %
Platelet Count: 449 10*3/uL — ABNORMAL HIGH (ref 150–400)
RBC: 4.48 MIL/uL (ref 4.22–5.81)
RDW: 13.4 % (ref 11.5–15.5)
WBC Count: 6.2 10*3/uL (ref 4.0–10.5)
nRBC: 0 % (ref 0.0–0.2)

## 2022-08-28 MED ORDER — ANAGRELIDE HCL 1 MG PO CAPS
ORAL_CAPSULE | ORAL | 3 refills | Status: DC
  Filled 2022-08-28: qty 120, 84d supply, fill #0
  Filled 2022-11-10: qty 120, 84d supply, fill #1
  Filled 2023-02-12: qty 85, 60d supply, fill #2
  Filled 2023-04-10: qty 85, 60d supply, fill #3

## 2022-08-28 NOTE — Assessment & Plan Note (Signed)
His platelet count is slightly higher than his baseline I recommend changing the dose of anagrelide He will take 2 mg dose on Mondays, Wednesdays and Fridays and to keep 1 mg dose for the rest of the week I plan to recheck it again in 3 months

## 2022-08-28 NOTE — Assessment & Plan Note (Signed)
I suspect the cause of his anemia will be related to anemia of chronic kidney disease It is only borderline abnormal Observe closely

## 2022-08-28 NOTE — Assessment & Plan Note (Signed)
The patient is on 5 different antihypertensives He has borderline abnormal kidney function in the past He has an appointment coming up next month to see his primary care doctor He will call me with test results In the meantime, I recommend hydration as tolerated and aggressive management of blood pressure

## 2022-08-28 NOTE — Progress Notes (Signed)
[place] OFFICE PROGRESS NOTE  Patient Care Team: Rich Fuchs, PA as PCP - General (Physician Assistant) Heath Lark, MD as Consulting Physician (Hematology and Oncology)  ASSESSMENT & PLAN:  Essential thrombocythemia [facility]) His platelet count is slightly higher than his baseline I recommend changing the dose of anagrelide He will take 2 mg dose on Mondays, Wednesdays and Fridays and to keep 1 mg dose for the rest of the week I plan to recheck it again in 3 months  Deficiency anemia I suspect the cause of his anemia will be related to anemia of chronic kidney disease It is only borderline abnormal Observe closely   Essential hypertension The patient is on 5 different antihypertensives He has borderline abnormal kidney function in the past He has an appointment coming up next month to see his primary care doctor He will call me with test results In the meantime, I recommend hydration as tolerated and aggressive management of blood pressure  Orders Placed This Encounter  Procedures   CBC with Differential/Platelet    Standing Status:   Standing    Number of Occurrences:   22    Standing Expiration Date:   08/29/2023    All questions were answered. The patient knows to call the clinic with any problems, questions or concerns. The total time spent in the appointment was 20 minutes encounter with patients including review of chart and various tests results, discussions about plan of care and coordination of care plan   Heath Lark, MD 08/28/2022 12:30 PM  INTERVAL HISTORY: Please see below for problem oriented charting. he returns for treatment follow-up on anagrelide for essential thrombocytosis He is compliant taking medications as directed He denies missing doses or recent infection  REVIEW OF SYSTEMS:   Constitutional: Denies fevers, chills or abnormal weight loss Eyes: Denies blurriness of vision Ears, nose, mouth, throat, and face: Denies  mucositis or sore throat Respiratory: Denies cough, dyspnea or wheezes Cardiovascular: Denies palpitation, chest discomfort or lower extremity swelling Gastrointestinal:  Denies nausea, heartburn or change in bowel habits Skin: Denies abnormal skin rashes Lymphatics: Denies new lymphadenopathy or easy bruising Neurological:Denies numbness, tingling or new weaknesses Behavioral/Psych: Mood is stable, no new changes  All other systems were reviewed with the patient and are negative.  I have reviewed the past medical history, past surgical history, social history and family history with the patient and they are unchanged from previous note.  ALLERGIES:  has No Known Allergies.  MEDICATIONS:  Current Outpatient Medications  Medication Sig Dispense Refill   anagrelide (AGRYLIN) 1 MG capsule Take 2 capsules by mouth on Mondays, Wednesdays and Fridays and 1 capsule for the rest of the week 120 capsule 3   atorvastatin (LIPITOR) 20 MG tablet Take 1 tablet (20 mg total) by mouth daily. NO MORE REFILLS WITHOUT OFFICE VISIT/ LABS - 2ND NOTICE 15 tablet 0   fluticasone (FLONASE) 50 MCG/ACT nasal spray PLACE 2 SPRAYS IN EACH NOSTRIL EVERY DAY 16 g 1   glucose blood (ONE TOUCH ULTRA TEST) test strip Use as instructed 100 each 0   hydrochlorothiazide (HYDRODIURIL) 25 MG tablet TAKE 2 TABLETS (50 MG DOSE) BY MOUTH DAILY.  1   lisinopril (PRINIVIL,ZESTRIL) 40 MG tablet Take 1 tablet (40 mg total) by mouth daily. 30 tablet 0   metoprolol succinate (TOPROL-XL) 50 MG 24 hr tablet TAKE ONE TABLET (50 MG DOSE) BY MOUTH DAILY.  4   Multiple Vitamins-Minerals (MULTIVITAMIN WITH MINERALS) tablet Take 1 tablet by mouth daily.  NIFEdipine (ADALAT CC) 60 MG 24 hr tablet Take 60 mg by mouth in the morning and at bedtime.     spironolactone (ALDACTONE) 25 MG tablet Take 25 mg by mouth daily.     SYNTHROID 200 MCG tablet TAKE ONE TABLET (200 MCG TOTAL) BY MOUTH DAILY.  1   traZODone (DESYREL) 50 MG tablet Take 50 mg  by mouth at bedtime.     No current facility-administered medications for this visit.    SUMMARY OF ONCOLOGIC HISTORY: Oncology History  Essential thrombocythemia ([place])  11/03/1984 Cancer Diagnosis   He was noted to have significant abnormal CBC   05/24/2003 Bone Marrow Biopsy   Case #: [id number] BM biopsy confirmed essential thrombocythemia   11/06/2003 -  Chemotherapy   He was placed on anagrelide   08/22/2016 - 08/29/2016 Hospital Admission   The patient was admitted to the hospital due to hemoptysis. CT angiogram of the chest concerning for pneumonia. Bronchoscopy done revealed a blot clot was found in the posterior segment of the right upper lobe (B2). It was completely obstructing the airway.The clot was successfully removed, but the lesion began bleeding. BAL was performed in the RUL posterior segment (B2) of the lung and sent for bacterial, AFB and fungal analysis   08/28/2016 Procedure   He underwent selective right bronchial arteriogram was performed and the vessel was embolized      PHYSICAL EXAMINATION: ECOG PERFORMANCE STATUS: 0 - Asymptomatic  Vitals:   08/28/22 1103  BP: (!) 150/70  Pulse: 65  Resp: 18  Temp: 98.5 F (36.9 C)  SpO2: 97%   Filed Weights   08/28/22 1103  Weight: 197 lb 9.6 oz (89.6 kg)    GENERAL:alert, no distress and comfortable NEURO: alert & oriented x 3 with fluent speech, no focal motor/sensory deficits  LABORATORY DATA:  I have reviewed the data as listed    Component Value Date/Time   NA 137 01/21/2019 1251   NA 137 12/16/2016 1328   K 4.5 01/21/2019 1251   K 4.1 12/16/2016 1328   CL 100 01/21/2019 1251   CL 105 09/17/2012 1030   CO2 28 01/21/2019 1251   CO2 24 12/16/2016 1328   GLUCOSE 125 (H) 01/21/2019 1251   GLUCOSE 154 (H) 12/16/2016 1328   GLUCOSE 140 (H) 09/17/2012 1030   BUN 21 (H) 01/21/2019 1251   BUN 14.1 12/16/2016 1328   CREATININE 1.39 (H) 01/21/2019 1251   CREATININE 1.22 12/18/2017 1427   CREATININE 0.9  12/16/2016 1328   CALCIUM 9.1 01/21/2019 1251   CALCIUM 9.9 12/16/2016 1328   PROT 7.2 01/21/2019 1251   PROT 7.3 12/16/2016 1328   ALBUMIN 4.0 01/21/2019 1251   ALBUMIN 4.0 12/16/2016 1328   AST 18 01/21/2019 1251   AST 22 12/18/2017 1427   AST 24 12/16/2016 1328   ALT 27 01/21/2019 1251   ALT 39 12/18/2017 1427   ALT 39 12/16/2016 1328   ALKPHOS 100 01/21/2019 1251   ALKPHOS 118 12/16/2016 1328   BILITOT 0.5 01/21/2019 1251   BILITOT 0.4 12/18/2017 1427   BILITOT 0.36 12/16/2016 1328   GFRNONAA 57 (L) 01/21/2019 1251   GFRNONAA >60 12/18/2017 1427   GFRAA >60 01/21/2019 1251   GFRAA >60 12/18/2017 1427    No results found for: "SPEP", "UPEP"  Lab Results  Component Value Date   WBC 6.2 08/28/2022   NEUTROABS 4.0 08/28/2022   HGB 12.9 (L) 08/28/2022   HCT 38.8 (L) 08/28/2022   MCV 86.6 08/28/2022  PLT 449 (H) 08/28/2022      Chemistry      Component Value Date/Time   NA 137 01/21/2019 1251   NA 137 12/16/2016 1328   K 4.5 01/21/2019 1251   K 4.1 12/16/2016 1328   CL 100 01/21/2019 1251   CL 105 09/17/2012 1030   CO2 28 01/21/2019 1251   CO2 24 12/16/2016 1328   BUN 21 (H) 01/21/2019 1251   BUN 14.1 12/16/2016 1328   CREATININE 1.39 (H) 01/21/2019 1251   CREATININE 1.22 12/18/2017 1427   CREATININE 0.9 12/16/2016 1328      Component Value Date/Time   CALCIUM 9.1 01/21/2019 1251   CALCIUM 9.9 12/16/2016 1328   ALKPHOS 100 01/21/2019 1251   ALKPHOS 118 12/16/2016 1328   AST 18 01/21/2019 1251   AST 22 12/18/2017 1427   AST 24 12/16/2016 1328   ALT 27 01/21/2019 1251   ALT 39 12/18/2017 1427   ALT 39 12/16/2016 1328   BILITOT 0.5 01/21/2019 1251   BILITOT 0.4 12/18/2017 1427   BILITOT 0.36 12/16/2016 1328

## 2022-08-29 ENCOUNTER — Other Ambulatory Visit (HOSPITAL_COMMUNITY): Payer: Self-pay

## 2022-09-01 ENCOUNTER — Other Ambulatory Visit (HOSPITAL_COMMUNITY): Payer: Self-pay

## 2022-09-02 ENCOUNTER — Other Ambulatory Visit (HOSPITAL_COMMUNITY): Payer: Self-pay

## 2022-09-03 ENCOUNTER — Other Ambulatory Visit (HOSPITAL_COMMUNITY): Payer: Self-pay

## 2022-10-09 ENCOUNTER — Telehealth: Payer: Self-pay

## 2022-10-09 NOTE — Telephone Encounter (Signed)
Pt called to report latest plt count of 343 drawn by [facility] PCP on 11/28.

## 2022-10-10 NOTE — Telephone Encounter (Signed)
It's perfect Continue current dose of anagrelide Keep appt as scheduled next year

## 2022-10-10 NOTE — Telephone Encounter (Signed)
Called and given below message. Pt verbalized understanding and appreciated the call.

## 2022-11-10 ENCOUNTER — Other Ambulatory Visit (HOSPITAL_COMMUNITY): Payer: Self-pay

## 2022-11-28 ENCOUNTER — Inpatient Hospital Stay: Payer: BC Managed Care – PPO | Admitting: Hematology and Oncology

## 2022-11-28 ENCOUNTER — Other Ambulatory Visit: Payer: Self-pay

## 2022-11-28 ENCOUNTER — Inpatient Hospital Stay: Payer: BC Managed Care – PPO | Attending: Hematology and Oncology

## 2022-11-28 VITALS — BP 116/61 | HR 102 | Temp 98.4°F | Resp 18 | Ht 67.5 in | Wt 185.2 lb

## 2022-11-28 DIAGNOSIS — E663 Overweight: Secondary | ICD-10-CM

## 2022-11-28 DIAGNOSIS — Z6825 Body mass index (BMI) 25.0-25.9, adult: Secondary | ICD-10-CM | POA: Diagnosis not present

## 2022-11-28 DIAGNOSIS — D473 Essential (hemorrhagic) thrombocythemia: Secondary | ICD-10-CM

## 2022-11-28 DIAGNOSIS — D75839 Thrombocytosis, unspecified: Secondary | ICD-10-CM | POA: Diagnosis present

## 2022-11-28 LAB — CBC WITH DIFFERENTIAL/PLATELET
Abs Immature Granulocytes: 0.01 10*3/uL (ref 0.00–0.07)
Basophils Absolute: 0 10*3/uL (ref 0.0–0.1)
Basophils Relative: 0 %
Eosinophils Absolute: 0.1 10*3/uL (ref 0.0–0.5)
Eosinophils Relative: 3 %
HCT: 41.2 % (ref 39.0–52.0)
Hemoglobin: 14.2 g/dL (ref 13.0–17.0)
Immature Granulocytes: 0 %
Lymphocytes Relative: 34 %
Lymphs Abs: 1.6 10*3/uL (ref 0.7–4.0)
MCH: 28.4 pg (ref 26.0–34.0)
MCHC: 34.5 g/dL (ref 30.0–36.0)
MCV: 82.4 fL (ref 80.0–100.0)
Monocytes Absolute: 0.4 10*3/uL (ref 0.1–1.0)
Monocytes Relative: 8 %
Neutro Abs: 2.5 10*3/uL (ref 1.7–7.7)
Neutrophils Relative %: 55 %
Platelets: 198 10*3/uL (ref 150–400)
RBC: 5 MIL/uL (ref 4.22–5.81)
RDW: 13.3 % (ref 11.5–15.5)
WBC: 4.6 10*3/uL (ref 4.0–10.5)
nRBC: 0 % (ref 0.0–0.2)

## 2022-11-29 ENCOUNTER — Encounter: Payer: Self-pay | Admitting: Hematology and Oncology

## 2022-11-29 NOTE — Assessment & Plan Note (Signed)
He is doing well with recent weight loss We discussed importance of lifestyle changes I congratulated his efforts

## 2022-11-29 NOTE — Assessment & Plan Note (Signed)
His platelet count is now normal He will take 2 mg dose on Mondays, Wednesdays and Fridays and to keep 1 mg dose for the rest of the week I plan to recheck it again in 6 months

## 2022-11-29 NOTE — Progress Notes (Signed)
[place] OFFICE PROGRESS NOTE  Patient Care Team: Rich Fuchs, PA as PCP - General (Physician Assistant) Heath Lark, MD as Consulting Physician (Hematology and Oncology)  ASSESSMENT & PLAN:  Essential thrombocythemia [facility]) His platelet count is now normal He will take 2 mg dose on Mondays, Wednesdays and Fridays and to keep 1 mg dose for the rest of the week I plan to recheck it again in 6 months  Overweight (BMI 25.0-29.9) He is doing well with recent weight loss We discussed importance of lifestyle changes I congratulated his efforts  No orders of the defined types were placed in this encounter.   All questions were answered. The patient knows to call the clinic with any problems, questions or concerns. The total time spent in the appointment was 20 minutes encounter with patients including review of chart and various tests results, discussions about plan of care and coordination of care plan   Heath Lark, MD 11/29/2022 9:35 AM  INTERVAL HISTORY: Please see below for problem oriented charting. he returns for treatment follow-up on anagrelide for essential thrombocythemia He is doing well and compliant taking his medications He was prescribed some medications recently that helped him to lose almost 13 pounds since our last visit He felt great No recent bleeding or infection  REVIEW OF SYSTEMS:   Constitutional: Denies fevers, chills or abnormal weight loss Eyes: Denies blurriness of vision Ears, nose, mouth, throat, and face: Denies mucositis or sore throat Respiratory: Denies cough, dyspnea or wheezes Cardiovascular: Denies palpitation, chest discomfort or lower extremity swelling Gastrointestinal:  Denies nausea, heartburn or change in bowel habits Skin: Denies abnormal skin rashes Lymphatics: Denies new lymphadenopathy or easy bruising Neurological:Denies numbness, tingling or new weaknesses Behavioral/Psych: Mood is stable, no new changes  All  other systems were reviewed with the patient and are negative.  I have reviewed the past medical history, past surgical history, social history and family history with the patient and they are unchanged from previous note.  ALLERGIES:  has No Known Allergies.  MEDICATIONS:  Current Outpatient Medications  Medication Sig Dispense Refill   anagrelide (AGRYLIN) 1 MG capsule Take 2 capsules by mouth on Mondays, Wednesdays and Fridays and 1 capsule each day of the rest of the week 120 capsule 3   atorvastatin (LIPITOR) 20 MG tablet Take 1 tablet (20 mg total) by mouth daily. NO MORE REFILLS WITHOUT OFFICE VISIT/ LABS - 2ND NOTICE 15 tablet 0   fluticasone (FLONASE) 50 MCG/ACT nasal spray PLACE 2 SPRAYS IN EACH NOSTRIL EVERY DAY 16 g 1   glucose blood (ONE TOUCH ULTRA TEST) test strip Use as instructed 100 each 0   hydrochlorothiazide (HYDRODIURIL) 25 MG tablet TAKE 2 TABLETS (50 MG DOSE) BY MOUTH DAILY.  1   lisinopril (PRINIVIL,ZESTRIL) 40 MG tablet Take 1 tablet (40 mg total) by mouth daily. 30 tablet 0   metoprolol succinate (TOPROL-XL) 50 MG 24 hr tablet TAKE ONE TABLET (50 MG DOSE) BY MOUTH DAILY.  4   Multiple Vitamins-Minerals (MULTIVITAMIN WITH MINERALS) tablet Take 1 tablet by mouth daily.     NIFEdipine (ADALAT CC) 60 MG 24 hr tablet Take 60 mg by mouth in the morning and at bedtime.     spironolactone (ALDACTONE) 25 MG tablet Take 25 mg by mouth daily.     SYNTHROID 200 MCG tablet TAKE ONE TABLET (200 MCG TOTAL) BY MOUTH DAILY.  1   traZODone (DESYREL) 50 MG tablet Take 50 mg by mouth at bedtime.  No current facility-administered medications for this visit.    SUMMARY OF ONCOLOGIC HISTORY: Oncology History  Essential thrombocythemia ([place])  11/03/1984 Cancer Diagnosis   He was noted to have significant abnormal CBC   05/24/2003 Bone Marrow Biopsy   Case #: [id number] BM biopsy confirmed essential thrombocythemia   11/06/2003 -  Chemotherapy   He was placed on anagrelide    08/22/2016 - 08/29/2016 Hospital Admission   The patient was admitted to the hospital due to hemoptysis. CT angiogram of the chest concerning for pneumonia. Bronchoscopy done revealed a blot clot was found in the posterior segment of the right upper lobe (B2). It was completely obstructing the airway.The clot was successfully removed, but the lesion began bleeding. BAL was performed in the RUL posterior segment (B2) of the lung and sent for bacterial, AFB and fungal analysis   08/28/2016 Procedure   He underwent selective right bronchial arteriogram was performed and the vessel was embolized      PHYSICAL EXAMINATION: ECOG PERFORMANCE STATUS: 0 - Asymptomatic  Vitals:   11/28/22 1049  BP: 116/61  Pulse: (!) 102  Resp: 18  Temp: 98.4 F (36.9 C)  SpO2: 98%   Filed Weights   11/28/22 1049  Weight: 185 lb 3.2 oz (84 kg)    GENERAL:alert, no distress and comfortable  NEURO: alert & oriented x 3 with fluent speech, no focal motor/sensory deficits  LABORATORY DATA:  I have reviewed the data as listed    Component Value Date/Time   NA 137 01/21/2019 1251   NA 137 12/16/2016 1328   K 4.5 01/21/2019 1251   K 4.1 12/16/2016 1328   CL 100 01/21/2019 1251   CL 105 09/17/2012 1030   CO2 28 01/21/2019 1251   CO2 24 12/16/2016 1328   GLUCOSE 125 (H) 01/21/2019 1251   GLUCOSE 154 (H) 12/16/2016 1328   GLUCOSE 140 (H) 09/17/2012 1030   BUN 21 (H) 01/21/2019 1251   BUN 14.1 12/16/2016 1328   CREATININE 1.39 (H) 01/21/2019 1251   CREATININE 1.22 12/18/2017 1427   CREATININE 0.9 12/16/2016 1328   CALCIUM 9.1 01/21/2019 1251   CALCIUM 9.9 12/16/2016 1328   PROT 7.2 01/21/2019 1251   PROT 7.3 12/16/2016 1328   ALBUMIN 4.0 01/21/2019 1251   ALBUMIN 4.0 12/16/2016 1328   AST 18 01/21/2019 1251   AST 22 12/18/2017 1427   AST 24 12/16/2016 1328   ALT 27 01/21/2019 1251   ALT 39 12/18/2017 1427   ALT 39 12/16/2016 1328   ALKPHOS 100 01/21/2019 1251   ALKPHOS 118 12/16/2016 1328    BILITOT 0.5 01/21/2019 1251   BILITOT 0.4 12/18/2017 1427   BILITOT 0.36 12/16/2016 1328   GFRNONAA 57 (L) 01/21/2019 1251   GFRNONAA >60 12/18/2017 1427   GFRAA >60 01/21/2019 1251   GFRAA >60 12/18/2017 1427    No results found for: "SPEP", "UPEP"  Lab Results  Component Value Date   WBC 4.6 11/28/2022   NEUTROABS 2.5 11/28/2022   HGB 14.2 11/28/2022   HCT 41.2 11/28/2022   MCV 82.4 11/28/2022   PLT 198 11/28/2022      Chemistry      Component Value Date/Time   NA 137 01/21/2019 1251   NA 137 12/16/2016 1328   K 4.5 01/21/2019 1251   K 4.1 12/16/2016 1328   CL 100 01/21/2019 1251   CL 105 09/17/2012 1030   CO2 28 01/21/2019 1251   CO2 24 12/16/2016 1328   BUN 21 (H) 01/21/2019 1251  BUN 14.1 12/16/2016 1328   CREATININE 1.39 (H) 01/21/2019 1251   CREATININE 1.22 12/18/2017 1427   CREATININE 0.9 12/16/2016 1328      Component Value Date/Time   CALCIUM 9.1 01/21/2019 1251   CALCIUM 9.9 12/16/2016 1328   ALKPHOS 100 01/21/2019 1251   ALKPHOS 118 12/16/2016 1328   AST 18 01/21/2019 1251   AST 22 12/18/2017 1427   AST 24 12/16/2016 1328   ALT 27 01/21/2019 1251   ALT 39 12/18/2017 1427   ALT 39 12/16/2016 1328   BILITOT 0.5 01/21/2019 1251   BILITOT 0.4 12/18/2017 1427   BILITOT 0.36 12/16/2016 1328

## 2022-12-24 ENCOUNTER — Other Ambulatory Visit (HOSPITAL_COMMUNITY): Payer: Self-pay

## 2022-12-25 ENCOUNTER — Other Ambulatory Visit (HOSPITAL_COMMUNITY): Payer: Self-pay

## 2022-12-25 MED ORDER — MOUNJARO 7.5 MG/0.5ML ~~LOC~~ SOAJ
7.5000 mg | SUBCUTANEOUS | 1 refills | Status: DC
  Filled 2022-12-25: qty 2, 28d supply, fill #0

## 2022-12-26 ENCOUNTER — Other Ambulatory Visit (HOSPITAL_COMMUNITY): Payer: Self-pay

## 2022-12-26 MED ORDER — MOUNJARO 7.5 MG/0.5ML ~~LOC~~ SOAJ
SUBCUTANEOUS | 1 refills | Status: DC
  Filled 2022-12-26 – 2023-04-14 (×2): qty 2, 28d supply, fill #0

## 2022-12-27 ENCOUNTER — Other Ambulatory Visit (HOSPITAL_COMMUNITY): Payer: Self-pay

## 2023-01-09 ENCOUNTER — Other Ambulatory Visit (HOSPITAL_COMMUNITY): Payer: Self-pay

## 2023-01-09 MED ORDER — MOUNJARO 10 MG/0.5ML ~~LOC~~ SOAJ
10.0000 mg | SUBCUTANEOUS | 3 refills | Status: DC
  Filled 2023-01-09 – 2023-01-16 (×2): qty 2, 28d supply, fill #0
  Filled 2023-02-12: qty 2, 28d supply, fill #1
  Filled 2023-03-12: qty 2, 28d supply, fill #2
  Filled 2023-04-10: qty 2, 28d supply, fill #3

## 2023-01-16 ENCOUNTER — Other Ambulatory Visit (HOSPITAL_COMMUNITY): Payer: Self-pay

## 2023-01-22 ENCOUNTER — Other Ambulatory Visit (HOSPITAL_COMMUNITY): Payer: Self-pay

## 2023-02-12 ENCOUNTER — Other Ambulatory Visit (HOSPITAL_COMMUNITY): Payer: Self-pay

## 2023-02-13 ENCOUNTER — Other Ambulatory Visit (HOSPITAL_COMMUNITY): Payer: Self-pay

## 2023-02-17 ENCOUNTER — Other Ambulatory Visit (HOSPITAL_COMMUNITY): Payer: Self-pay

## 2023-03-12 ENCOUNTER — Other Ambulatory Visit (HOSPITAL_COMMUNITY): Payer: Self-pay

## 2023-04-10 ENCOUNTER — Other Ambulatory Visit (HOSPITAL_COMMUNITY): Payer: Self-pay

## 2023-04-14 ENCOUNTER — Other Ambulatory Visit (HOSPITAL_COMMUNITY): Payer: Self-pay

## 2023-04-15 ENCOUNTER — Other Ambulatory Visit (HOSPITAL_COMMUNITY): Payer: Self-pay

## 2023-04-17 ENCOUNTER — Other Ambulatory Visit (HOSPITAL_COMMUNITY): Payer: Self-pay

## 2023-05-05 ENCOUNTER — Telehealth: Payer: Self-pay | Admitting: Hematology and Oncology

## 2023-05-05 NOTE — Telephone Encounter (Signed)
Spoke with patient confirming upcoming appointment  

## 2023-05-09 ENCOUNTER — Other Ambulatory Visit (HOSPITAL_COMMUNITY): Payer: Self-pay

## 2023-05-11 ENCOUNTER — Other Ambulatory Visit (HOSPITAL_COMMUNITY): Payer: Self-pay

## 2023-05-11 MED ORDER — MOUNJARO 10 MG/0.5ML ~~LOC~~ SOAJ
10.0000 mg | SUBCUTANEOUS | 3 refills | Status: DC
  Filled 2023-05-11: qty 2, 28d supply, fill #0
  Filled 2023-06-05: qty 2, 28d supply, fill #1
  Filled 2023-06-25 – 2023-06-29 (×2): qty 2, 28d supply, fill #2
  Filled 2023-08-11: qty 2, 28d supply, fill #3

## 2023-05-12 ENCOUNTER — Other Ambulatory Visit (HOSPITAL_COMMUNITY): Payer: Self-pay

## 2023-05-29 ENCOUNTER — Other Ambulatory Visit: Payer: BC Managed Care – PPO

## 2023-05-29 ENCOUNTER — Ambulatory Visit: Payer: BC Managed Care – PPO | Admitting: Hematology and Oncology

## 2023-06-05 ENCOUNTER — Other Ambulatory Visit (HOSPITAL_COMMUNITY): Payer: Self-pay

## 2023-06-05 ENCOUNTER — Encounter: Payer: Self-pay | Admitting: Hematology and Oncology

## 2023-06-05 ENCOUNTER — Inpatient Hospital Stay: Payer: BC Managed Care – PPO | Admitting: Hematology and Oncology

## 2023-06-05 ENCOUNTER — Inpatient Hospital Stay: Payer: BC Managed Care – PPO | Attending: Hematology and Oncology

## 2023-06-05 VITALS — BP 101/48 | HR 103 | Temp 98.5°F | Resp 18 | Ht 67.5 in | Wt 189.8 lb

## 2023-06-05 DIAGNOSIS — D75839 Thrombocytosis, unspecified: Secondary | ICD-10-CM | POA: Insufficient documentation

## 2023-06-05 DIAGNOSIS — D473 Essential (hemorrhagic) thrombocythemia: Secondary | ICD-10-CM | POA: Insufficient documentation

## 2023-06-05 DIAGNOSIS — E119 Type 2 diabetes mellitus without complications: Secondary | ICD-10-CM | POA: Diagnosis not present

## 2023-06-05 LAB — CBC WITH DIFFERENTIAL/PLATELET
Abs Immature Granulocytes: 0.03 10*3/uL (ref 0.00–0.07)
Basophils Absolute: 0 10*3/uL (ref 0.0–0.1)
Basophils Relative: 1 %
Eosinophils Absolute: 0.2 10*3/uL (ref 0.0–0.5)
Eosinophils Relative: 3 %
HCT: 37.9 % — ABNORMAL LOW (ref 39.0–52.0)
Hemoglobin: 13.1 g/dL (ref 13.0–17.0)
Immature Granulocytes: 1 %
Lymphocytes Relative: 23 %
Lymphs Abs: 1.5 10*3/uL (ref 0.7–4.0)
MCH: 29.4 pg (ref 26.0–34.0)
MCHC: 34.6 g/dL (ref 30.0–36.0)
MCV: 85.2 fL (ref 80.0–100.0)
Monocytes Absolute: 0.5 10*3/uL (ref 0.1–1.0)
Monocytes Relative: 8 %
Neutro Abs: 4.2 10*3/uL (ref 1.7–7.7)
Neutrophils Relative %: 64 %
Platelets: 271 10*3/uL (ref 150–400)
RBC: 4.45 MIL/uL (ref 4.22–5.81)
RDW: 13.5 % (ref 11.5–15.5)
WBC: 6.5 10*3/uL (ref 4.0–10.5)
nRBC: 0 % (ref 0.0–0.2)

## 2023-06-05 NOTE — Assessment & Plan Note (Signed)
His platelet count is normal He will take 2 mg dose on Mondays, Wednesdays and Fridays and to keep 1 mg dose for the rest of the week I plan to recheck it again in 6 months

## 2023-06-05 NOTE — Progress Notes (Signed)
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   All questions were answered. The patient knows to call the clinic with any problems, questions or concerns. The total time spent in the appointment was 20 minutes encounter with patients including review of chart and various tests results, discussions about plan of care and coordination of care plan   Artis Delay, MD 06/05/2023 3:00 PM  INTERVAL HISTORY: Please see below for problem oriented charting. he returns for treatment follow-up He is compliant taking anagrelide as prescribed He is doing well overall Denies recent bleeding or infection  REVIEW OF SYSTEMS:   Constitutional: Denies fevers, chills or abnormal weight loss Eyes: Denies blurriness of vision Ears, nose, mouth, throat, and face: Denies mucositis or sore throat Respiratory: Denies cough, dyspnea or wheezes Cardiovascular: Denies palpitation, chest discomfort or lower extremity swelling Gastrointestinal:  Denies nausea, heartburn or change in bowel habits Skin: Denies abnormal skin rashes Lymphatics: Denies new lymphadenopathy or easy bruising Neurological:Denies numbness, tingling or new weaknesses Behavioral/Psych: Mood is stable, no new changes  All other systems were reviewed with the patient and are negative.  I have reviewed the past medical history, past surgical history,  social history and family history with the patient and they are unchanged from previous note.  ALLERGIES:  has No Known Allergies.  MEDICATIONS:  Current Outpatient Medications  Medication Sig Dispense Refill   anagrelide (AGRYLIN) 1 MG capsule Take 2 capsules by mouth on Mondays, Wednesdays and Fridays and 1 capsule each day of the rest of the week 120 capsule 3   atorvastatin (LIPITOR) 20 MG tablet Take 1 tablet (20 mg total) by mouth daily. NO MORE REFILLS WITHOUT OFFICE VISIT/ LABS - 2ND NOTICE 15 tablet 0   fluticasone (FLONASE) 50 MCG/ACT nasal spray PLACE 2 SPRAYS IN EACH NOSTRIL EVERY DAY 16 g 1   glucose blood (ONE TOUCH ULTRA TEST) test strip Use as instructed 100 each 0   hydrochlorothiazide (HYDRODIURIL) 25 MG tablet TAKE 2 TABLETS (50 MG DOSE) BY MOUTH DAILY.  1   lisinopril (PRINIVIL,ZESTRIL) 40 MG tablet Take 1 tablet (40 mg total) by mouth daily. 30 tablet 0   metoprolol succinate (TOPROL-XL) 50 MG 24 hr tablet TAKE ONE TABLET (50 MG DOSE) BY MOUTH DAILY.  4   Multiple Vitamins-Minerals (MULTIVITAMIN WITH MINERALS) tablet Take 1 tablet by mouth daily.     NIFEdipine (ADALAT CC) 60 MG 24 hr tablet Take 60 mg by mouth in the morning and at bedtime.     spironolactone (ALDACTONE) 25 MG tablet Take 25 mg by mouth daily.     SYNTHROID 200 MCG tablet TAKE ONE TABLET (200 MCG TOTAL) BY MOUTH DAILY.  1   tirzepatide (MOUNJARO) 10 MG/0.5ML Pen Inject 10 mg into the skin once a week. 2 mL 3   traZODone (DESYREL) 50 MG tablet Take 50 mg by mouth at bedtime.     No  current facility-administered medications for this visit.    SUMMARY OF ONCOLOGIC HISTORY: Oncology History  Essential thrombocythemia (HCC)  11/03/1984 Cancer Diagnosis   He was noted to have significant abnormal CBC   05/24/2003 Bone Marrow Biopsy   Case #: [id number] BM biopsy confirmed essential thrombocythemia   11/06/2003 -  Chemotherapy   He was placed on anagrelide   08/22/2016 - 08/29/2016 Hospital Admission    The patient was admitted to the hospital due to hemoptysis. CT angiogram of the chest concerning for pneumonia. Bronchoscopy done revealed a blot clot was found in the posterior segment of the right upper lobe (B2). It was completely obstructing the airway.The clot was successfully removed, but the lesion began bleeding. BAL was performed in the RUL posterior segment (B2) of the lung and sent for bacterial, AFB and fungal analysis   08/28/2016 Procedure   He underwent selective right bronchial arteriogram was performed and the vessel was embolized      PHYSICAL EXAMINATION: ECOG PERFORMANCE STATUS: 0 - Asymptomatic  Vitals:   06/05/23 1201  BP: (!) 101/48  Pulse: (!) 103  Resp: 18  Temp: 98.5 F (36.9 C)  SpO2: 93%   Filed Weights   06/05/23 1201  Weight: 189 lb 12.8 oz (86.1 kg)    GENERAL:alert, no distress and comfortable SKIN: skin color, texture, turgor are normal, no rashes or significant lesions EYES: normal, Conjunctiva are pink and non-injected, sclera clear OROPHARYNX:no exudate, no erythema and lips, buccal mucosa, and tongue normal  NECK: supple, thyroid normal size, non-tender, without nodularity LYMPH:  no palpable lymphadenopathy in the cervical, axillary or inguinal LUNGS: clear to auscultation and percussion with normal breathing effort HEART: regular rate & rhythm and no murmurs and no lower extremity edema ABDOMEN:abdomen soft, non-tender and normal bowel sounds Musculoskeletal:no cyanosis of digits and no clubbing  NEURO: alert & oriented x 3 with fluent speech, no focal motor/sensory deficits  LABORATORY DATA:  I have reviewed the data as listed    Component Value Date/Time   NA 137 01/21/2019 1251   NA 137 12/16/2016 1328   K 4.5 01/21/2019 1251   K 4.1 12/16/2016 1328   CL 100 01/21/2019 1251   CL 105 09/17/2012 1030   CO2 28 01/21/2019 1251   CO2 24 12/16/2016 1328   GLUCOSE 125 (H) 01/21/2019 1251   GLUCOSE 154 (H) 12/16/2016 1328   GLUCOSE  140 (H) 09/17/2012 1030   BUN 21 (H) 01/21/2019 1251   BUN 14.1 12/16/2016 1328   CREATININE 1.39 (H) 01/21/2019 1251   CREATININE 1.22 12/18/2017 1427   CREATININE 0.9 12/16/2016 1328   CALCIUM 9.1 01/21/2019 1251   CALCIUM 9.9 12/16/2016 1328   PROT 7.2 01/21/2019 1251   PROT 7.3 12/16/2016 1328   ALBUMIN 4.0 01/21/2019 1251   ALBUMIN 4.0 12/16/2016 1328   AST 18 01/21/2019 1251   AST 22 12/18/2017 1427   AST 24 12/16/2016 1328   ALT 27 01/21/2019 1251   ALT 39 12/18/2017 1427   ALT 39 12/16/2016 1328   ALKPHOS 100 01/21/2019 1251   ALKPHOS 118 12/16/2016 1328   BILITOT 0.5 01/21/2019 1251   BILITOT 0.4 12/18/2017 1427   BILITOT 0.36 12/16/2016 1328   GFRNONAA 57 (L) 01/21/2019 1251   GFRNONAA >60 12/18/2017 1427   GFRAA >60 01/21/2019 1251   GFRAA >60 12/18/2017 1427    No results found for: "SPEP", "UPEP"  Lab Results  Component Value Date   WBC 6.5 06/05/2023   NEUTROABS 4.2 06/05/2023  HGB 13.1 06/05/2023   HCT 37.9 (L) 06/05/2023   MCV 85.2 06/05/2023   PLT 271 06/05/2023      Chemistry      Component Value Date/Time   NA 137 01/21/2019 1251   NA 137 12/16/2016 1328   K 4.5 01/21/2019 1251   K 4.1 12/16/2016 1328   CL 100 01/21/2019 1251   CL 105 09/17/2012 1030   CO2 28 01/21/2019 1251   CO2 24 12/16/2016 1328   BUN 21 (H) 01/21/2019 1251   BUN 14.1 12/16/2016 1328   CREATININE 1.39 (H) 01/21/2019 1251   CREATININE 1.22 12/18/2017 1427   CREATININE 0.9 12/16/2016 1328      Component Value Date/Time   CALCIUM 9.1 01/21/2019 1251   CALCIUM 9.9 12/16/2016 1328   ALKPHOS 100 01/21/2019 1251   ALKPHOS 118 12/16/2016 1328   AST 18 01/21/2019 1251   AST 22 12/18/2017 1427   AST 24 12/16/2016 1328   ALT 27 01/21/2019 1251   ALT 39 12/18/2017 1427   ALT 39 12/16/2016 1328   BILITOT 0.5 01/21/2019 1251   BILITOT 0.4 12/18/2017 1427   BILITOT 0.36 12/16/2016 1328

## 2023-06-05 NOTE — Assessment & Plan Note (Signed)
His blood sugar control has improved with Mounjaro He will continue medication as prescribed by his physician

## 2023-06-25 ENCOUNTER — Other Ambulatory Visit (HOSPITAL_COMMUNITY): Payer: Self-pay

## 2023-06-25 ENCOUNTER — Other Ambulatory Visit: Payer: Self-pay | Admitting: Hematology and Oncology

## 2023-06-25 MED ORDER — ANAGRELIDE HCL 1 MG PO CAPS
ORAL_CAPSULE | ORAL | 3 refills | Status: DC
  Filled 2023-06-25: qty 120, 84d supply, fill #0
  Filled 2023-09-12: qty 120, 84d supply, fill #1
  Filled 2024-01-13: qty 120, 84d supply, fill #2
  Filled 2024-04-02: qty 120, 84d supply, fill #3

## 2023-06-26 ENCOUNTER — Other Ambulatory Visit: Payer: Self-pay

## 2023-06-26 ENCOUNTER — Other Ambulatory Visit (HOSPITAL_COMMUNITY): Payer: Self-pay

## 2023-06-30 ENCOUNTER — Other Ambulatory Visit (HOSPITAL_COMMUNITY): Payer: Self-pay

## 2023-07-02 ENCOUNTER — Other Ambulatory Visit (HOSPITAL_COMMUNITY): Payer: Self-pay

## 2023-08-11 ENCOUNTER — Other Ambulatory Visit (HOSPITAL_COMMUNITY): Payer: Self-pay

## 2023-08-12 ENCOUNTER — Other Ambulatory Visit (HOSPITAL_COMMUNITY): Payer: Self-pay

## 2023-08-14 ENCOUNTER — Other Ambulatory Visit (HOSPITAL_COMMUNITY): Payer: Self-pay

## 2023-08-18 ENCOUNTER — Other Ambulatory Visit (HOSPITAL_COMMUNITY): Payer: Self-pay

## 2023-08-19 ENCOUNTER — Other Ambulatory Visit (HOSPITAL_COMMUNITY): Payer: Self-pay

## 2023-08-19 MED ORDER — MOUNJARO 10 MG/0.5ML ~~LOC~~ SOAJ
10.0000 mg | SUBCUTANEOUS | 3 refills | Status: DC
  Filled 2023-08-19 – 2023-09-12 (×2): qty 2, 28d supply, fill #0
  Filled 2023-10-21: qty 2, 28d supply, fill #1
  Filled 2023-12-04: qty 2, 28d supply, fill #2
  Filled 2024-01-13: qty 2, 28d supply, fill #3

## 2023-08-20 ENCOUNTER — Other Ambulatory Visit (HOSPITAL_COMMUNITY): Payer: Self-pay

## 2023-09-01 ENCOUNTER — Other Ambulatory Visit (HOSPITAL_COMMUNITY): Payer: Self-pay

## 2023-09-03 ENCOUNTER — Other Ambulatory Visit (HOSPITAL_COMMUNITY): Payer: Self-pay

## 2023-09-12 ENCOUNTER — Other Ambulatory Visit (HOSPITAL_COMMUNITY): Payer: Self-pay

## 2023-09-15 ENCOUNTER — Other Ambulatory Visit (HOSPITAL_COMMUNITY): Payer: Self-pay

## 2023-09-29 ENCOUNTER — Other Ambulatory Visit (HOSPITAL_COMMUNITY): Payer: Self-pay

## 2023-10-21 ENCOUNTER — Other Ambulatory Visit (HOSPITAL_COMMUNITY): Payer: Self-pay

## 2023-10-22 ENCOUNTER — Other Ambulatory Visit (HOSPITAL_COMMUNITY): Payer: Self-pay

## 2023-11-30 ENCOUNTER — Telehealth: Payer: Self-pay

## 2023-11-30 NOTE — Telephone Encounter (Signed)
Called and left a message asking him to call the office back. Asking if he can move 2/7 appt to later that day or to 2/6.

## 2023-12-04 ENCOUNTER — Other Ambulatory Visit (HOSPITAL_COMMUNITY): Payer: Self-pay

## 2023-12-11 ENCOUNTER — Inpatient Hospital Stay: Payer: BC Managed Care – PPO | Attending: Hematology and Oncology

## 2023-12-11 ENCOUNTER — Encounter: Payer: Self-pay | Admitting: Hematology and Oncology

## 2023-12-11 ENCOUNTER — Ambulatory Visit: Payer: BC Managed Care – PPO | Admitting: Hematology and Oncology

## 2023-12-11 ENCOUNTER — Inpatient Hospital Stay (HOSPITAL_BASED_OUTPATIENT_CLINIC_OR_DEPARTMENT_OTHER): Payer: BC Managed Care – PPO | Admitting: Hematology and Oncology

## 2023-12-11 ENCOUNTER — Other Ambulatory Visit: Payer: BC Managed Care – PPO

## 2023-12-11 VITALS — BP 140/71 | HR 61 | Temp 97.9°F | Resp 18 | Ht 67.5 in | Wt 187.0 lb

## 2023-12-11 DIAGNOSIS — D473 Essential (hemorrhagic) thrombocythemia: Secondary | ICD-10-CM

## 2023-12-11 DIAGNOSIS — E663 Overweight: Secondary | ICD-10-CM | POA: Diagnosis not present

## 2023-12-11 DIAGNOSIS — D75839 Thrombocytosis, unspecified: Secondary | ICD-10-CM | POA: Diagnosis present

## 2023-12-11 LAB — CBC WITH DIFFERENTIAL/PLATELET
Abs Immature Granulocytes: 0.02 10*3/uL (ref 0.00–0.07)
Basophils Absolute: 0 10*3/uL (ref 0.0–0.1)
Basophils Relative: 0 %
Eosinophils Absolute: 0.2 10*3/uL (ref 0.0–0.5)
Eosinophils Relative: 3 %
HCT: 43.1 % (ref 39.0–52.0)
Hemoglobin: 14.4 g/dL (ref 13.0–17.0)
Immature Granulocytes: 0 %
Lymphocytes Relative: 26 %
Lymphs Abs: 1.8 10*3/uL (ref 0.7–4.0)
MCH: 28.2 pg (ref 26.0–34.0)
MCHC: 33.4 g/dL (ref 30.0–36.0)
MCV: 84.5 fL (ref 80.0–100.0)
Monocytes Absolute: 0.5 10*3/uL (ref 0.1–1.0)
Monocytes Relative: 7 %
Neutro Abs: 4.3 10*3/uL (ref 1.7–7.7)
Neutrophils Relative %: 64 %
Platelets: 387 10*3/uL (ref 150–400)
RBC: 5.1 MIL/uL (ref 4.22–5.81)
RDW: 13.2 % (ref 11.5–15.5)
WBC: 6.8 10*3/uL (ref 4.0–10.5)
nRBC: 0 % (ref 0.0–0.2)

## 2023-12-11 NOTE — Assessment & Plan Note (Signed)
He is doing well with recent weight loss We discussed importance of lifestyle changes I congratulated his efforts

## 2023-12-11 NOTE — Assessment & Plan Note (Signed)
His platelet count is normal He will take 2 mg dose on Mondays, Wednesdays and Fridays and to keep 1 mg dose for the rest of the week I plan to recheck it again in 6 months

## 2023-12-11 NOTE — Progress Notes (Signed)
 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   All questions were answered. The patient knows to call the clinic with any problems, questions or concerns. The total time spent in the appointment was 20 minutes encounter with patients including review of chart and various tests results, discussions about plan of care and coordination of care plan   Hicks Lonn, MD 12/11/2023 1:27 PM  INTERVAL HISTORY: Please see below for problem oriented charting. he returns for chemo follow-up on anagrelide  He tolerated treatment well Denies recent infection He continues on lifestyle changes and lost some weight since last time I saw him  REVIEW OF SYSTEMS:   Constitutional: Denies fevers, chills or abnormal weight loss Eyes: Denies blurriness of vision Ears, nose, mouth, throat, and face: Denies mucositis or sore throat Respiratory: Denies cough, dyspnea or wheezes Cardiovascular: Denies palpitation, chest discomfort or lower extremity swelling Gastrointestinal:  Denies nausea, heartburn or change in bowel habits Skin: Denies abnormal skin rashes Lymphatics: Denies new lymphadenopathy or easy bruising Neurological:Denies numbness, tingling or new weaknesses Behavioral/Psych: Mood is stable, no new changes  All other systems were reviewed with the patient and are negative.  I have reviewed the past medical history, past  surgical history, social history and family history with the patient and they are unchanged from previous note.  ALLERGIES:  has no known allergies.  MEDICATIONS:  Current Outpatient Medications  Medication Sig Dispense Refill   anagrelide  (AGRYLIN) 1 MG capsule Take 2 capsules by mouth on Mondays, Wednesdays and Fridays and 1 capsule each day of the rest of the week 120 capsule 3   atorvastatin  (LIPITOR) 20 MG tablet Take 1 tablet (20 mg total) by mouth daily. NO MORE REFILLS WITHOUT OFFICE VISIT/ LABS - 2ND NOTICE 15 tablet 0   fluticasone  (FLONASE ) 50 MCG/ACT nasal spray PLACE 2 SPRAYS IN EACH NOSTRIL EVERY DAY 16 g 1   glucose blood (ONE TOUCH ULTRA TEST) test strip Use as instructed 100 each 0   hydrochlorothiazide  (HYDRODIURIL ) 25 MG tablet TAKE 2 TABLETS (50 MG DOSE) BY MOUTH DAILY.  1   lisinopril  (PRINIVIL ,ZESTRIL ) 40 MG tablet Take 1 tablet (40 mg total) by mouth daily. 30 tablet 0   metoprolol succinate (TOPROL-XL) 50 MG 24 hr tablet TAKE ONE TABLET (50 MG DOSE) BY MOUTH DAILY.  4   Multiple Vitamins-Minerals (MULTIVITAMIN WITH MINERALS) tablet Take 1 tablet by mouth daily.     NIFEdipine (ADALAT CC) 60 MG 24 hr tablet Take 60 mg by mouth in the morning and at bedtime.     spironolactone (ALDACTONE) 25 MG tablet Take 25 mg by mouth daily.     SYNTHROID  200 MCG tablet TAKE ONE TABLET (200 MCG TOTAL) BY MOUTH DAILY.  1   tirzepatide  (MOUNJARO ) 10 MG/0.5ML Pen Inject 10 mg into the skin once a week. 2 mL 3   traZODone (DESYREL) 50 MG tablet Take 50 mg by mouth at bedtime.  No current facility-administered medications for this visit.    SUMMARY OF ONCOLOGIC HISTORY: Oncology History  Essential thrombocythemia (HCC)  11/03/1984 Cancer Diagnosis   He was noted to have significant abnormal CBC   05/24/2003 Bone Marrow Biopsy   Case #: [id number] BM biopsy confirmed essential thrombocythemia   11/06/2003 -  Chemotherapy   He was placed on anagrelide    08/22/2016 - 08/29/2016  Hospital Admission   The patient was admitted to the hospital due to hemoptysis. CT angiogram of the chest concerning for pneumonia. Bronchoscopy done revealed a blot clot was found in the posterior segment of the right upper lobe (B2). It was completely obstructing the airway.The clot was successfully removed, but the lesion began bleeding. BAL was performed in the RUL posterior segment (B2) of the lung and sent for bacterial, AFB and fungal analysis   08/28/2016 Procedure   He underwent selective right bronchial arteriogram was performed and the vessel was embolized      PHYSICAL EXAMINATION: ECOG PERFORMANCE STATUS: 0 - Asymptomatic  Vitals:   12/11/23 1259  BP: (!) 140/71  Pulse: 61  Resp: 18  Temp: 97.9 F (36.6 C)  SpO2: 100%   Filed Weights   12/11/23 1259  Weight: 187 lb (84.8 kg)    GENERAL:alert, no distress and comfortable NEURO: alert & oriented x 3 with fluent speech, no focal motor/sensory deficits  LABORATORY DATA:  I have reviewed the data as listed    Component Value Date/Time   NA 137 01/21/2019 1251   NA 137 12/16/2016 1328   K 4.5 01/21/2019 1251   K 4.1 12/16/2016 1328   CL 100 01/21/2019 1251   CL 105 09/17/2012 1030   CO2 28 01/21/2019 1251   CO2 24 12/16/2016 1328   GLUCOSE 125 (H) 01/21/2019 1251   GLUCOSE 154 (H) 12/16/2016 1328   GLUCOSE 140 (H) 09/17/2012 1030   BUN 21 (H) 01/21/2019 1251   BUN 14.1 12/16/2016 1328   CREATININE 1.39 (H) 01/21/2019 1251   CREATININE 1.22 12/18/2017 1427   CREATININE 0.9 12/16/2016 1328   CALCIUM  9.1 01/21/2019 1251   CALCIUM  9.9 12/16/2016 1328   PROT 7.2 01/21/2019 1251   PROT 7.3 12/16/2016 1328   ALBUMIN 4.0 01/21/2019 1251   ALBUMIN 4.0 12/16/2016 1328   AST 18 01/21/2019 1251   AST 22 12/18/2017 1427   AST 24 12/16/2016 1328   ALT 27 01/21/2019 1251   ALT 39 12/18/2017 1427   ALT 39 12/16/2016 1328   ALKPHOS 100 01/21/2019 1251   ALKPHOS 118 12/16/2016 1328   BILITOT 0.5 01/21/2019 1251    BILITOT 0.4 12/18/2017 1427   BILITOT 0.36 12/16/2016 1328   GFRNONAA 57 (L) 01/21/2019 1251   GFRNONAA >60 12/18/2017 1427   GFRAA >60 01/21/2019 1251   GFRAA >60 12/18/2017 1427    No results found for: SPEP, UPEP  Lab Results  Component Value Date   WBC 6.8 12/11/2023   NEUTROABS 4.3 12/11/2023   HGB 14.4 12/11/2023   HCT 43.1 12/11/2023   MCV 84.5 12/11/2023   PLT 387 12/11/2023      Chemistry      Component Value Date/Time   NA 137 01/21/2019 1251   NA 137 12/16/2016 1328   K 4.5 01/21/2019 1251   K 4.1 12/16/2016 1328   CL 100 01/21/2019 1251   CL 105 09/17/2012 1030   CO2 28 01/21/2019 1251   CO2 24 12/16/2016 1328   BUN 21 (H) 01/21/2019 1251   BUN  14.1 12/16/2016 1328   CREATININE 1.39 (H) 01/21/2019 1251   CREATININE 1.22 12/18/2017 1427   CREATININE 0.9 12/16/2016 1328      Component Value Date/Time   CALCIUM  9.1 01/21/2019 1251   CALCIUM  9.9 12/16/2016 1328   ALKPHOS 100 01/21/2019 1251   ALKPHOS 118 12/16/2016 1328   AST 18 01/21/2019 1251   AST 22 12/18/2017 1427   AST 24 12/16/2016 1328   ALT 27 01/21/2019 1251   ALT 39 12/18/2017 1427   ALT 39 12/16/2016 1328   BILITOT 0.5 01/21/2019 1251   BILITOT 0.4 12/18/2017 1427   BILITOT 0.36 12/16/2016 1328

## 2024-01-13 ENCOUNTER — Other Ambulatory Visit (HOSPITAL_COMMUNITY): Payer: Self-pay

## 2024-01-14 ENCOUNTER — Other Ambulatory Visit (HOSPITAL_COMMUNITY): Payer: Self-pay

## 2024-01-14 ENCOUNTER — Other Ambulatory Visit: Payer: Self-pay

## 2024-02-16 ENCOUNTER — Other Ambulatory Visit (HOSPITAL_COMMUNITY): Payer: Self-pay

## 2024-02-16 MED ORDER — MOUNJARO 10 MG/0.5ML ~~LOC~~ SOAJ
10.0000 mg | SUBCUTANEOUS | 0 refills | Status: DC
  Filled 2024-02-16: qty 2, 28d supply, fill #0

## 2024-04-02 ENCOUNTER — Other Ambulatory Visit (HOSPITAL_COMMUNITY): Payer: Self-pay

## 2024-04-04 ENCOUNTER — Other Ambulatory Visit (HOSPITAL_COMMUNITY): Payer: Self-pay

## 2024-04-04 ENCOUNTER — Other Ambulatory Visit (HOSPITAL_BASED_OUTPATIENT_CLINIC_OR_DEPARTMENT_OTHER): Payer: Self-pay

## 2024-04-04 ENCOUNTER — Other Ambulatory Visit: Payer: Self-pay

## 2024-04-04 MED ORDER — MOUNJARO 12.5 MG/0.5ML ~~LOC~~ SOAJ
12.5000 mg | SUBCUTANEOUS | 0 refills | Status: DC
  Filled 2024-04-04: qty 2, 28d supply, fill #0

## 2024-05-12 ENCOUNTER — Other Ambulatory Visit (HOSPITAL_COMMUNITY): Payer: Self-pay

## 2024-05-12 ENCOUNTER — Other Ambulatory Visit (HOSPITAL_BASED_OUTPATIENT_CLINIC_OR_DEPARTMENT_OTHER): Payer: Self-pay

## 2024-05-12 MED ORDER — MOUNJARO 12.5 MG/0.5ML ~~LOC~~ SOAJ
12.5000 mg | SUBCUTANEOUS | 3 refills | Status: AC
  Filled 2024-05-12: qty 2, 28d supply, fill #0

## 2024-05-31 ENCOUNTER — Other Ambulatory Visit (HOSPITAL_BASED_OUTPATIENT_CLINIC_OR_DEPARTMENT_OTHER): Payer: Self-pay

## 2024-05-31 MED ORDER — CIPROFLOXACIN HCL 500 MG PO TABS
500.0000 mg | ORAL_TABLET | Freq: Two times a day (BID) | ORAL | 0 refills | Status: AC
  Filled 2024-05-31: qty 6, 3d supply, fill #0

## 2024-06-10 ENCOUNTER — Inpatient Hospital Stay: Payer: BC Managed Care – PPO

## 2024-06-10 ENCOUNTER — Inpatient Hospital Stay: Payer: BC Managed Care – PPO | Admitting: Hematology and Oncology

## 2024-08-03 ENCOUNTER — Other Ambulatory Visit (HOSPITAL_BASED_OUTPATIENT_CLINIC_OR_DEPARTMENT_OTHER): Payer: Self-pay

## 2024-08-03 ENCOUNTER — Other Ambulatory Visit: Payer: Self-pay

## 2024-08-03 ENCOUNTER — Other Ambulatory Visit (HOSPITAL_COMMUNITY): Payer: Self-pay

## 2024-08-03 ENCOUNTER — Other Ambulatory Visit: Payer: Self-pay | Admitting: Hematology and Oncology

## 2024-08-03 MED ORDER — ANAGRELIDE HCL 1 MG PO CAPS
ORAL_CAPSULE | ORAL | 3 refills | Status: AC
  Filled 2024-08-03: qty 120, 84d supply, fill #0
  Filled 2024-10-28: qty 120, 84d supply, fill #1

## 2024-08-15 ENCOUNTER — Inpatient Hospital Stay: Attending: Hematology and Oncology

## 2024-08-15 ENCOUNTER — Encounter: Payer: Self-pay | Admitting: Hematology and Oncology

## 2024-08-15 ENCOUNTER — Inpatient Hospital Stay: Admitting: Hematology and Oncology

## 2024-08-15 VITALS — BP 123/56 | HR 110 | Temp 99.1°F | Resp 18 | Ht 67.5 in | Wt 182.6 lb

## 2024-08-15 DIAGNOSIS — D473 Essential (hemorrhagic) thrombocythemia: Secondary | ICD-10-CM

## 2024-08-15 DIAGNOSIS — Z79899 Other long term (current) drug therapy: Secondary | ICD-10-CM | POA: Insufficient documentation

## 2024-08-15 LAB — CBC WITH DIFFERENTIAL/PLATELET
Abs Immature Granulocytes: 0.02 K/uL (ref 0.00–0.07)
Basophils Absolute: 0 K/uL (ref 0.0–0.1)
Basophils Relative: 1 %
Eosinophils Absolute: 0.1 K/uL (ref 0.0–0.5)
Eosinophils Relative: 2 %
HCT: 42.2 % (ref 39.0–52.0)
Hemoglobin: 14.1 g/dL (ref 13.0–17.0)
Immature Granulocytes: 0 %
Lymphocytes Relative: 20 %
Lymphs Abs: 1.3 K/uL (ref 0.7–4.0)
MCH: 28.3 pg (ref 26.0–34.0)
MCHC: 33.4 g/dL (ref 30.0–36.0)
MCV: 84.7 fL (ref 80.0–100.0)
Monocytes Absolute: 0.4 K/uL (ref 0.1–1.0)
Monocytes Relative: 7 %
Neutro Abs: 4.4 K/uL (ref 1.7–7.7)
Neutrophils Relative %: 70 %
Platelets: 323 K/uL (ref 150–400)
RBC: 4.98 MIL/uL (ref 4.22–5.81)
RDW: 12.8 % (ref 11.5–15.5)
WBC: 6.2 K/uL (ref 4.0–10.5)
nRBC: 0 % (ref 0.0–0.2)

## 2024-08-15 NOTE — Progress Notes (Signed)
 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  He underwent subsequent evaluation and bone marrow biopsy in July 2004 that confirms diagnosis of essential thrombocytosis Starting November 23, 2003, he was placed on anagrelide .  The patient developed hemoptysis in 2017 and aspirin was discontinued He is somewhat compliant taking his medication although sometimes he forgets his second dose His platelet count is normal He will take 2 mg dose on Mondays, Wednesdays and Fridays and to keep 1 mg dose for the rest of the week I plan to recheck it again in 6 months  No orders of the defined types were placed in this encounter.    Hicks Lonn, MD  INTERVAL HISTORY: he returns for surveillance follow-up for myeloproliferative disorder/neoplasm Patient denies recent bleeding such as epistaxis, hematuria or hematochezia No recent infection We reviewed medication list and discussed medication changes We discussed test results and future plan of care as outlined above The patient was recently diagnosed with prostate cancer and will undergo radiation  PHYSICAL EXAMINATION: ECOG PERFORMANCE STATUS: 0 - Asymptomatic  Vitals:   08/15/24 0917  BP: (!) 123/56  Pulse: (!) 110  Resp: 18  Temp: 99.1 F (37.3 C)  SpO2: 95%   Lab Results  Component Value Date   WBC 6.2 08/15/2024   HGB 14.1 08/15/2024   HCT 42.2 08/15/2024   MCV 84.7 08/15/2024   PLT 323 08/15/2024

## 2024-08-15 NOTE — Assessment & Plan Note (Addendum)
 The patient was noted to have abnormal CBC in 1986.  He underwent subsequent evaluation and bone marrow biopsy in July 2004 that confirms diagnosis of essential thrombocytosis Starting November 23, 2003, he was placed on anagrelide .  The patient developed hemoptysis in 2017 and aspirin was discontinued He is somewhat compliant taking his medication although sometimes he forgets his second dose His platelet count is normal He will take 2 mg dose on Mondays, Wednesdays and Fridays and to keep 1 mg dose for the rest of the week I plan to recheck it again in 6 months

## 2024-08-16 ENCOUNTER — Telehealth: Payer: Self-pay | Admitting: Hematology and Oncology

## 2024-08-16 NOTE — Telephone Encounter (Signed)
 Left vm about scheduled appt date and time. Encouraged to call back if this needs to be rescheduled

## 2024-10-28 ENCOUNTER — Other Ambulatory Visit (HOSPITAL_COMMUNITY): Payer: Self-pay

## 2024-11-14 ENCOUNTER — Other Ambulatory Visit: Payer: Self-pay

## 2024-11-14 ENCOUNTER — Other Ambulatory Visit (HOSPITAL_BASED_OUTPATIENT_CLINIC_OR_DEPARTMENT_OTHER): Payer: Self-pay

## 2024-11-14 MED ORDER — JARDIANCE 25 MG PO TABS
25.0000 mg | ORAL_TABLET | Freq: Every day | ORAL | 1 refills | Status: AC
  Filled 2024-11-14 – 2024-12-07 (×3): qty 90, 90d supply, fill #0

## 2024-11-14 MED ORDER — GLIMEPIRIDE 4 MG PO TABS
4.0000 mg | ORAL_TABLET | Freq: Every morning | ORAL | 1 refills | Status: AC
  Filled 2024-11-14: qty 90, 90d supply, fill #0

## 2024-11-14 MED ORDER — OZEMPIC (0.25 OR 0.5 MG/DOSE) 2 MG/3ML ~~LOC~~ SOPN
0.2500 mg | PEN_INJECTOR | SUBCUTANEOUS | 1 refills | Status: AC
  Filled 2024-11-14: qty 9, 84d supply, fill #0

## 2024-11-15 ENCOUNTER — Other Ambulatory Visit (HOSPITAL_BASED_OUTPATIENT_CLINIC_OR_DEPARTMENT_OTHER): Payer: Self-pay

## 2024-12-05 ENCOUNTER — Other Ambulatory Visit (HOSPITAL_BASED_OUTPATIENT_CLINIC_OR_DEPARTMENT_OTHER): Payer: Self-pay

## 2024-12-07 ENCOUNTER — Other Ambulatory Visit: Payer: Self-pay

## 2024-12-07 ENCOUNTER — Other Ambulatory Visit (HOSPITAL_BASED_OUTPATIENT_CLINIC_OR_DEPARTMENT_OTHER): Payer: Self-pay

## 2025-02-14 ENCOUNTER — Inpatient Hospital Stay

## 2025-02-14 ENCOUNTER — Inpatient Hospital Stay: Admitting: Hematology and Oncology
